# Patient Record
Sex: Male | Born: 1945 | Race: Black or African American | Hispanic: No | Marital: Single | Smoking: Former smoker
Health system: Southern US, Community
[De-identification: ages and names within clinical notes are randomized; demographics above are authoritative.]

## PROBLEM LIST (undated history)

## (undated) DIAGNOSIS — R51 Headache: Secondary | ICD-10-CM

## (undated) DIAGNOSIS — S069XAA Unspecified intracranial injury with loss of consciousness status unknown, initial encounter: Secondary | ICD-10-CM

## (undated) DIAGNOSIS — E119 Type 2 diabetes mellitus without complications: Secondary | ICD-10-CM

## (undated) DIAGNOSIS — R519 Headache, unspecified: Secondary | ICD-10-CM

## (undated) DIAGNOSIS — I2510 Atherosclerotic heart disease of native coronary artery without angina pectoris: Secondary | ICD-10-CM

## (undated) DIAGNOSIS — N186 End stage renal disease: Secondary | ICD-10-CM

## (undated) DIAGNOSIS — Z992 Dependence on renal dialysis: Secondary | ICD-10-CM

## (undated) DIAGNOSIS — S069X9A Unspecified intracranial injury with loss of consciousness of unspecified duration, initial encounter: Secondary | ICD-10-CM

## (undated) DIAGNOSIS — I509 Heart failure, unspecified: Secondary | ICD-10-CM

## (undated) DIAGNOSIS — I469 Cardiac arrest, cause unspecified: Secondary | ICD-10-CM

## (undated) DIAGNOSIS — I10 Essential (primary) hypertension: Secondary | ICD-10-CM

## (undated) DIAGNOSIS — D649 Anemia, unspecified: Secondary | ICD-10-CM

---

## 2017-11-14 DIAGNOSIS — I469 Cardiac arrest, cause unspecified: Secondary | ICD-10-CM

## 2017-11-14 HISTORY — DX: Cardiac arrest, cause unspecified: I46.9

## 2018-02-27 ENCOUNTER — Inpatient Hospital Stay (HOSPITAL_COMMUNITY)
Admission: EM | Admit: 2018-02-27 | Discharge: 2018-03-29 | DRG: 034 | Disposition: A | Discharge status: Home Health | Payer: Medicare HMO | Source: Ambulatory Visit | Attending: Internal Medicine | Admitting: Internal Medicine

## 2018-02-27 ENCOUNTER — Emergency Department (HOSPITAL_COMMUNITY): Payer: Medicare HMO

## 2018-02-27 ENCOUNTER — Observation Stay (HOSPITAL_COMMUNITY): Payer: Medicare HMO

## 2018-02-27 ENCOUNTER — Other Ambulatory Visit: Payer: Self-pay

## 2018-02-27 DIAGNOSIS — R945 Abnormal results of liver function studies: Secondary | ICD-10-CM

## 2018-02-27 DIAGNOSIS — I6522 Occlusion and stenosis of left carotid artery: Secondary | ICD-10-CM | POA: Diagnosis present

## 2018-02-27 DIAGNOSIS — R778 Other specified abnormalities of plasma proteins: Secondary | ICD-10-CM | POA: Diagnosis present

## 2018-02-27 DIAGNOSIS — J9601 Acute respiratory failure with hypoxia: Secondary | ICD-10-CM | POA: Diagnosis not present

## 2018-02-27 DIAGNOSIS — I214 Non-ST elevation (NSTEMI) myocardial infarction: Secondary | ICD-10-CM

## 2018-02-27 DIAGNOSIS — N2581 Secondary hyperparathyroidism of renal origin: Secondary | ICD-10-CM | POA: Diagnosis present

## 2018-02-27 DIAGNOSIS — E1122 Type 2 diabetes mellitus with diabetic chronic kidney disease: Secondary | ICD-10-CM | POA: Diagnosis present

## 2018-02-27 DIAGNOSIS — Z7982 Long term (current) use of aspirin: Secondary | ICD-10-CM

## 2018-02-27 DIAGNOSIS — I771 Stricture of artery: Secondary | ICD-10-CM

## 2018-02-27 DIAGNOSIS — R7989 Other specified abnormal findings of blood chemistry: Secondary | ICD-10-CM | POA: Diagnosis present

## 2018-02-27 DIAGNOSIS — D631 Anemia in chronic kidney disease: Secondary | ICD-10-CM | POA: Diagnosis present

## 2018-02-27 DIAGNOSIS — Z79899 Other long term (current) drug therapy: Secondary | ICD-10-CM

## 2018-02-27 DIAGNOSIS — I132 Hypertensive heart and chronic kidney disease with heart failure and with stage 5 chronic kidney disease, or end stage renal disease: Secondary | ICD-10-CM | POA: Diagnosis present

## 2018-02-27 DIAGNOSIS — I63422 Cerebral infarction due to embolism of left anterior cerebral artery: Principal | ICD-10-CM | POA: Diagnosis present

## 2018-02-27 DIAGNOSIS — I10 Essential (primary) hypertension: Secondary | ICD-10-CM | POA: Diagnosis present

## 2018-02-27 DIAGNOSIS — I6523 Occlusion and stenosis of bilateral carotid arteries: Secondary | ICD-10-CM | POA: Diagnosis present

## 2018-02-27 DIAGNOSIS — T829XXA Unspecified complication of cardiac and vascular prosthetic device, implant and graft, initial encounter: Secondary | ICD-10-CM | POA: Diagnosis present

## 2018-02-27 DIAGNOSIS — I255 Ischemic cardiomyopathy: Secondary | ICD-10-CM | POA: Diagnosis present

## 2018-02-27 DIAGNOSIS — Z992 Dependence on renal dialysis: Secondary | ICD-10-CM

## 2018-02-27 DIAGNOSIS — G459 Transient cerebral ischemic attack, unspecified: Secondary | ICD-10-CM | POA: Diagnosis not present

## 2018-02-27 DIAGNOSIS — Z888 Allergy status to other drugs, medicaments and biological substances status: Secondary | ICD-10-CM

## 2018-02-27 DIAGNOSIS — G8194 Hemiplegia, unspecified affecting left nondominant side: Secondary | ICD-10-CM | POA: Diagnosis present

## 2018-02-27 DIAGNOSIS — I6529 Occlusion and stenosis of unspecified carotid artery: Secondary | ICD-10-CM

## 2018-02-27 DIAGNOSIS — R0981 Nasal congestion: Secondary | ICD-10-CM | POA: Diagnosis present

## 2018-02-27 DIAGNOSIS — K922 Gastrointestinal hemorrhage, unspecified: Secondary | ICD-10-CM | POA: Diagnosis not present

## 2018-02-27 DIAGNOSIS — E785 Hyperlipidemia, unspecified: Secondary | ICD-10-CM | POA: Diagnosis present

## 2018-02-27 DIAGNOSIS — S069XAA Unspecified intracranial injury with loss of consciousness status unknown, initial encounter: Secondary | ICD-10-CM | POA: Diagnosis present

## 2018-02-27 DIAGNOSIS — I5022 Chronic systolic (congestive) heart failure: Secondary | ICD-10-CM

## 2018-02-27 DIAGNOSIS — E119 Type 2 diabetes mellitus without complications: Secondary | ICD-10-CM

## 2018-02-27 DIAGNOSIS — Z5309 Procedure and treatment not carried out because of other contraindication: Secondary | ICD-10-CM | POA: Diagnosis not present

## 2018-02-27 DIAGNOSIS — I509 Heart failure, unspecified: Secondary | ICD-10-CM

## 2018-02-27 DIAGNOSIS — T82855A Stenosis of coronary artery stent, initial encounter: Secondary | ICD-10-CM | POA: Diagnosis present

## 2018-02-27 DIAGNOSIS — R29702 NIHSS score 2: Secondary | ICD-10-CM | POA: Diagnosis present

## 2018-02-27 DIAGNOSIS — I6501 Occlusion and stenosis of right vertebral artery: Secondary | ICD-10-CM | POA: Diagnosis present

## 2018-02-27 DIAGNOSIS — I639 Cerebral infarction, unspecified: Secondary | ICD-10-CM

## 2018-02-27 DIAGNOSIS — R627 Adult failure to thrive: Secondary | ICD-10-CM

## 2018-02-27 DIAGNOSIS — Z8249 Family history of ischemic heart disease and other diseases of the circulatory system: Secondary | ICD-10-CM

## 2018-02-27 DIAGNOSIS — R0602 Shortness of breath: Secondary | ICD-10-CM

## 2018-02-27 DIAGNOSIS — M898X9 Other specified disorders of bone, unspecified site: Secondary | ICD-10-CM | POA: Diagnosis present

## 2018-02-27 DIAGNOSIS — I252 Old myocardial infarction: Secondary | ICD-10-CM

## 2018-02-27 DIAGNOSIS — N186 End stage renal disease: Secondary | ICD-10-CM

## 2018-02-27 DIAGNOSIS — D696 Thrombocytopenia, unspecified: Secondary | ICD-10-CM | POA: Diagnosis not present

## 2018-02-27 DIAGNOSIS — D638 Anemia in other chronic diseases classified elsewhere: Secondary | ICD-10-CM

## 2018-02-27 DIAGNOSIS — I2510 Atherosclerotic heart disease of native coronary artery without angina pectoris: Secondary | ICD-10-CM | POA: Diagnosis present

## 2018-02-27 DIAGNOSIS — Z955 Presence of coronary angioplasty implant and graft: Secondary | ICD-10-CM

## 2018-02-27 DIAGNOSIS — E1151 Type 2 diabetes mellitus with diabetic peripheral angiopathy without gangrene: Secondary | ICD-10-CM | POA: Diagnosis present

## 2018-02-27 DIAGNOSIS — S069X9A Unspecified intracranial injury with loss of consciousness of unspecified duration, initial encounter: Secondary | ICD-10-CM | POA: Diagnosis present

## 2018-02-27 DIAGNOSIS — N2889 Other specified disorders of kidney and ureter: Secondary | ICD-10-CM | POA: Diagnosis present

## 2018-02-27 DIAGNOSIS — D62 Acute posthemorrhagic anemia: Secondary | ICD-10-CM | POA: Diagnosis present

## 2018-02-27 DIAGNOSIS — Z87891 Personal history of nicotine dependence: Secondary | ICD-10-CM

## 2018-02-27 HISTORY — DX: End stage renal disease: N18.6

## 2018-02-27 HISTORY — DX: Dependence on renal dialysis: Z99.2

## 2018-02-27 HISTORY — DX: Heart failure, unspecified: I50.9

## 2018-02-27 HISTORY — DX: Headache, unspecified: R51.9

## 2018-02-27 HISTORY — DX: Unspecified intracranial injury with loss of consciousness status unknown, initial encounter: S06.9XAA

## 2018-02-27 HISTORY — DX: Type 2 diabetes mellitus without complications: E11.9

## 2018-02-27 HISTORY — DX: Unspecified intracranial injury with loss of consciousness of unspecified duration, initial encounter: S06.9X9A

## 2018-02-27 HISTORY — DX: Essential (primary) hypertension: I10

## 2018-02-27 HISTORY — DX: Atherosclerotic heart disease of native coronary artery without angina pectoris: I25.10

## 2018-02-27 HISTORY — DX: Headache: R51

## 2018-02-27 HISTORY — DX: Cardiac arrest, cause unspecified: I46.9

## 2018-02-27 HISTORY — DX: Anemia, unspecified: D64.9

## 2018-02-27 LAB — COMPREHENSIVE METABOLIC PANEL
ALT: 42 U/L (ref 0–44)
AST: 28 U/L (ref 15–41)
Albumin: 3.5 g/dL (ref 3.5–5.0)
Alkaline Phosphatase: 141 U/L — ABNORMAL HIGH (ref 38–126)
Anion gap: 14 (ref 5–15)
BUN: 31 mg/dL — ABNORMAL HIGH (ref 8–23)
CHLORIDE: 101 mmol/L (ref 98–111)
CO2: 24 mmol/L (ref 22–32)
Calcium: 9.3 mg/dL (ref 8.9–10.3)
Creatinine, Ser: 8.45 mg/dL — ABNORMAL HIGH (ref 0.61–1.24)
GFR calc Af Amer: 7 mL/min — ABNORMAL LOW (ref >60)
GFR calc non Af Amer: 6 mL/min — ABNORMAL LOW (ref >60)
Glucose, Bld: 134 mg/dL — ABNORMAL HIGH (ref 70–99)
Potassium: 4.1 mmol/L (ref 3.5–5.1)
Sodium: 139 mmol/L (ref 135–145)
Total Bilirubin: 1.5 mg/dL — ABNORMAL HIGH (ref 0.3–1.2)
Total Protein: 8.9 g/dL — ABNORMAL HIGH (ref 6.5–8.1)

## 2018-02-27 LAB — DIFFERENTIAL
Abs Immature Granulocytes: 0.04 10*3/uL (ref 0.00–0.07)
Basophils Absolute: 0 10*3/uL (ref 0.0–0.1)
Basophils Relative: 1 %
Eosinophils Absolute: 0.7 10*3/uL — ABNORMAL HIGH (ref 0.0–0.5)
Eosinophils Relative: 9 %
IMMATURE GRANULOCYTES: 1 %
Lymphocytes Relative: 24 %
Lymphs Abs: 1.7 10*3/uL (ref 0.7–4.0)
Monocytes Absolute: 0.5 10*3/uL (ref 0.1–1.0)
Monocytes Relative: 7 %
Neutro Abs: 4.2 10*3/uL (ref 1.7–7.7)
Neutrophils Relative %: 58 %

## 2018-02-27 LAB — CBC
HCT: 41.9 % (ref 39.0–52.0)
Hemoglobin: 13.2 g/dL (ref 13.0–17.0)
MCH: 28.7 pg (ref 26.0–34.0)
MCHC: 31.5 g/dL (ref 30.0–36.0)
MCV: 91.1 fL (ref 80.0–100.0)
NRBC: 0 % (ref 0.0–0.2)
PLATELETS: 194 10*3/uL (ref 150–400)
RBC: 4.6 MIL/uL (ref 4.22–5.81)
RDW: 15.6 % — ABNORMAL HIGH (ref 11.5–15.5)
WBC: 7.1 10*3/uL (ref 4.0–10.5)

## 2018-02-27 LAB — PROTIME-INR
INR: 1.08
Prothrombin Time: 13.9 seconds (ref 11.4–15.2)

## 2018-02-27 LAB — ETHANOL: Alcohol, Ethyl (B): <10 mg/dL (ref <10)

## 2018-02-27 LAB — I-STAT TROPONIN, ED: Troponin i, poc: 0.09 ng/mL (ref 0.00–0.08)

## 2018-02-27 LAB — APTT: aPTT: 28 seconds (ref 24–36)

## 2018-02-27 LAB — TROPONIN I: Troponin I: 0.22 ng/mL (ref <0.03)

## 2018-02-27 LAB — I-STAT CREATININE, ED: Creatinine, Ser: 8.6 mg/dL — ABNORMAL HIGH (ref 0.61–1.24)

## 2018-02-27 MED ORDER — HEPARIN SODIUM (PORCINE) 5000 UNIT/ML IJ SOLN
5000.0000 [IU] | Freq: Three times a day (TID) | INTRAMUSCULAR | Status: DC
  Administered 2018-02-27 – 2018-03-03 (×12): 5000 [IU] via SUBCUTANEOUS
  Filled 2018-02-27 (×12): qty 1

## 2018-02-27 MED ORDER — POLYVINYL ALCOHOL 1.4 % OP SOLN
1.0000 [drp] | Freq: Every day | OPHTHALMIC | Status: DC | PRN
  Filled 2018-02-27: qty 15

## 2018-02-27 MED ORDER — STROKE: EARLY STAGES OF RECOVERY BOOK
Freq: Once | Status: AC
  Administered 2018-02-27: 23:00:00
  Filled 2018-02-27: qty 1

## 2018-02-27 MED ORDER — SENNOSIDES-DOCUSATE SODIUM 8.6-50 MG PO TABS: 1.0000 | ORAL_TABLET | Freq: Every evening | ORAL | Status: DC | PRN

## 2018-02-27 MED ORDER — ASPIRIN EC 325 MG PO TBEC
325.0000 mg | DELAYED_RELEASE_TABLET | Freq: Every day | ORAL | Status: DC
  Administered 2018-02-28 – 2018-03-05 (×5): 325 mg via ORAL
  Filled 2018-02-27 (×7): qty 1

## 2018-02-27 MED ORDER — ACETAMINOPHEN 325 MG PO TABS
650.0000 mg | ORAL_TABLET | Freq: Four times a day (QID) | ORAL | Status: DC | PRN
  Administered 2018-02-27 – 2018-03-09 (×8): 650 mg via ORAL
  Filled 2018-02-27 (×9): qty 2

## 2018-02-27 MED ORDER — RENA-VITE PO TABS
1.0000 | ORAL_TABLET | Freq: Every day | ORAL | Status: DC
  Administered 2018-02-27 – 2018-03-29 (×29): 1 via ORAL
  Filled 2018-02-27 (×30): qty 1

## 2018-02-27 MED ORDER — AMLODIPINE BESYLATE 5 MG PO TABS
5.0000 mg | ORAL_TABLET | Freq: Every day | ORAL | Status: DC
  Administered 2018-02-27 – 2018-03-17 (×19): 5 mg via ORAL
  Filled 2018-02-27 (×4): qty 1
  Filled 2018-02-27: qty 2
  Filled 2018-02-27 (×14): qty 1

## 2018-02-27 MED ORDER — ATORVASTATIN CALCIUM 40 MG PO TABS
40.0000 mg | ORAL_TABLET | Freq: Every day | ORAL | Status: DC
  Administered 2018-02-27 – 2018-03-28 (×28): 40 mg via ORAL
  Filled 2018-02-27 (×12): qty 1
  Filled 2018-02-27: qty 4
  Filled 2018-02-27 (×16): qty 1

## 2018-02-27 MED ORDER — IOHEXOL 300 MG/ML  SOLN
100.0000 mL | Freq: Once | INTRAMUSCULAR | Status: AC | PRN
  Administered 2018-02-27: 100 mL via INTRAVENOUS

## 2018-02-27 MED ORDER — FAMOTIDINE 20 MG PO TABS
20.0000 mg | ORAL_TABLET | Freq: Every day | ORAL | Status: DC
  Administered 2018-02-27 – 2018-03-28 (×30): 20 mg via ORAL
  Filled 2018-02-27 (×30): qty 1

## 2018-02-27 NOTE — ED Triage Notes (Signed)
Pt was at dialysis today when he was finished he could not get out of the chair. Pt felt weak on left side. Pt states he always feels tired after dialysis but he always drives himself there and home. Pt NIH 2, drift on left arm, slight ataxia with left arm. Pt. Alert and oriented x 4

## 2018-02-27 NOTE — H&P (Addendum)
History and Physical    Peter [name] KNL:[id number] DOB: May 30, 1945 DOA: 02/27/2018  PCP: Patient, No Pcp Per  Chief Complaint: Weakness  HPI: Peter [name] is a 73 y.o. male with a past medical history of ESRD on HD, hypertension being brought to the hospital from his dialysis center due to strokelike symptoms with left sided weakness. Last known normal 3:15 this afternoon.  Patient states after finishing dialysis today he felt weak and could not get out of the chair.  Both of his legs felt weak and his right leg was shaking.  He could not lift his arms.  States he was told that his left arm was not moving.  Denies any history of prior stroke.  He is a former smoker; quit 4 years ago.  He was previously smoking 1/2 pack/day for about 20 years.  He is complaining of a headache.  Denies having any chest pain.  No additional history could be obtained from the patient.  Review of Systems: As per HPI otherwise 10 point review of systems negative.  Past Medical History:  Diagnosis Date  . ESRD (end stage renal disease) on dialysis ([place])   . Headache   . HTN (hypertension)     History reviewed. No pertinent surgical history.   reports that he has quit smoking. He does not have any smokeless tobacco history on file. He reports that he does not drink alcohol or use drugs.  Allergies  Allergen Reactions  . Metformin And Related Other (See Comments)    Contraindicated due to renal failure.  Renal failure-on peritoneal dialysis    . Pioglitazone Other (See Comments)    History of congestive heart failure.    . Propoxyphene Rash    No family history on file.  Prior to Admission medications   Medication Sig Start Date End Date Taking? Authorizing Provider  acetaminophen (TYLENOL) 500 MG tablet Take 1,500 mg by mouth daily as needed (pain).    Yes [provider]  amLODipine (NORVASC) 5 MG tablet Take 5 mg by mouth at bedtime. 02/06/18  Yes [provider]  aspirin EC 325  MG tablet Take 325 mg by mouth daily.   Yes [provider]  atorvastatin (LIPITOR) 40 MG tablet Take 40 mg by mouth daily at 6 PM. 12/24/17  Yes [provider]  Calcium Carbonate Antacid (TUMS PO) Take 1-3 mg by mouth See admin instructions. Take 3 tablets by mouth up to three times daily with full meals, take 1 tablet with snacks/small meals   Yes [provider]  famotidine (PEPCID) 20 MG tablet Take 20 mg by mouth at bedtime. 12/22/17  Yes [provider]  furosemide (LASIX) 40 MG tablet Take 40 mg by mouth 2 (two) times daily. 02/17/18  Yes [provider]  isosorbide mononitrate (IMDUR) 60 MG 24 hr tablet Take 60 mg by mouth daily. 12/25/17  Yes [provider]  losartan (COZAAR) 100 MG tablet Take 100 mg by mouth daily. 02/25/18  Yes [provider]  metoprolol (TOPROL-XL) 200 MG 24 hr tablet Take 200 mg by mouth daily. 10/24/17  Yes [provider]  multivitamin (RENA-VIT) TABS tablet Take 1 tablet by mouth daily.   Yes [provider]  nitroGLYCERIN (NITROSTAT) 0.4 MG SL tablet Place 0.4 mg under the tongue every 5 (five) minutes as needed for chest pain.  12/25/17  Yes [provider]  polyvinyl alcohol (ARTIFICIAL TEARS) 1.4 % ophthalmic solution Place 1 drop into both eyes daily as needed  for dry eyes.   Yes [provider]    Physical Exam: Vitals:   02/27/18 2150 02/28/18 0040 02/28/18 0230 02/28/18 0402  BP: (!) 190/72 (!) 172/54 (!) 165/47 (!) 180/52  Pulse: 70 70 70 72  Resp: 16 18 18 18   Temp: 98.9 F (37.2 C) 98.4 F (36.9 C) 98.2 F (36.8 C) 98.2 F (36.8 C)  TempSrc: Oral Oral Oral Oral  SpO2: 98% 98% 98% 100%  Weight:        Physical Exam  Constitutional: He is oriented to person, place, and time. He appears well-developed and well-nourished. No distress.  HENT:  Head: Normocephalic.  Mouth/Throat: Oropharynx is clear and moist.  Eyes: Right eye exhibits no  discharge. Left eye exhibits no discharge.  Neck: Neck supple.  Cardiovascular: Normal rate, regular rhythm and intact distal pulses.  Pulmonary/Chest: Effort normal and breath sounds normal. No respiratory distress. He has no wheezes. He has no rales.  Abdominal: Soft. Bowel sounds are normal. He exhibits no distension. There is no abdominal tenderness. There is no guarding.  Musculoskeletal:        General: No edema.  Neurological: He is alert and oriented to person, place, and time.  Slow to respond to questions Speech fluent, tongue midline, no facial droop. Strength 4 out of 5 in the left upper and lower extremities. Strength 5 out of 5 in the right upper and lower extremities. Sensation to light touch grossly intact throughout.  Skin: Skin is warm and dry. He is not diaphoretic.     Labs on Admission: I have personally reviewed following labs and imaging studies  CBC: Recent Labs  Lab 02/27/18 1658  WBC 7.1  NEUTROABS 4.2  HGB 13.2  HCT 41.9  MCV 91.1  PLT 373   Basic Metabolic Panel: Recent Labs  Lab 02/27/18 1658 02/27/18 1703  NA 139  --   K 4.1  --   CL 101  --   CO2 24  --   GLUCOSE 134*  --   BUN 31*  --   CREATININE 8.45* 8.60*  CALCIUM 9.3  --    GFR: CrCl cannot be calculated (Unknown ideal weight.). Liver Function Tests: Recent Labs  Lab 02/27/18 1658  AST 28  ALT 42  ALKPHOS 141*  BILITOT 1.5*  PROT 8.9*  ALBUMIN 3.5   No results for input(s): LIPASE, AMYLASE in the last 168 hours. No results for input(s): AMMONIA in the last 168 hours. Coagulation Profile: Recent Labs  Lab 02/27/18 1658  INR 1.08   Cardiac Enzymes: Recent Labs  Lab 02/27/18 2251 02/28/18 0207  TROPONINI 0.22* 0.26*   BNP (last 3 results) No results for input(s): PROBNP in the last 8760 hours. HbA1C: Recent Labs    02/28/18 0207  HGBA1C 6.6*   CBG: No results for input(s): GLUCAP in the last 168 hours. Lipid Profile: Recent Labs    02/28/18 0207    CHOL 106  HDL 36*  LDLCALC 48  TRIG 108  CHOLHDL 2.9   Thyroid Function Tests: No results for input(s): TSH, T4TOTAL, FREET4, T3FREE, THYROIDAB in the last 72 hours. Anemia Panel: No results for input(s): VITAMINB12, FOLATE, FERRITIN, TIBC, IRON, RETICCTPCT in the last 72 hours. Urine analysis: No results found for: COLORURINE, APPEARANCEUR, [place], [place], GLUCOSEU, HGBUR, BILIRUBINUR, KETONESUR, PROTEINUR, UROBILINOGEN, NITRITE, LEUKOCYTESUR  Radiological Exams on Admission: Ct Angio Head W Or Wo Contrast  Result Date: 02/27/2018 CLINICAL DATA:  Sudden onset left-sided weakness and altered mental status following dialysis. EXAM:  CT ANGIOGRAPHY HEAD AND NECK TECHNIQUE: Multidetector CT imaging of the head and neck was performed using the standard protocol during bolus administration of intravenous contrast. Multiplanar CT image reconstructions and MIPs were obtained to evaluate the vascular anatomy. Carotid stenosis measurements (when applicable) are obtained utilizing NASCET criteria, using the distal internal carotid diameter as the denominator. CONTRAST:  115mL OMNIPAQUE IOHEXOL 300 MG/ML  SOLN COMPARISON:  Head CT 02/27/2018 FINDINGS: CTA NECK FINDINGS SKELETON: There is no bony spinal canal stenosis. No lytic or blastic lesion. OTHER NECK: Normal pharynx, larynx and major salivary glands. No cervical lymphadenopathy. Unremarkable thyroid gland. UPPER CHEST: Mild biapical emphysema AORTIC ARCH: There is moderate calcific atherosclerosis of the aortic arch. There is no aneurysm, dissection or hemodynamically significant stenosis of the visualized ascending aorta and aortic arch. Normal variant aortic arch branching pattern with the left vertebral artery arising independently from the aortic arch. The visualized proximal subclavian arteries are widely patent. RIGHT CAROTID SYSTEM: --Common carotid artery: Widely patent origin without common carotid artery dissection or aneurysm. --Internal  carotid artery: No dissection, occlusion or aneurysm. Mild atherosclerotic calcification at the carotid bifurcation without hemodynamically significant stenosis. --External carotid artery: No acute abnormality. LEFT CAROTID SYSTEM: --Common carotid artery: Widely patent origin without common carotid artery dissection or aneurysm. --Internal carotid artery: No dissection, occlusion or aneurysm. There is predominantly noncalcified atherosclerosis extending into the proximal ICA, resulting in 50% stenosis. Small area of likely ulcerative plaque within the distal left ICA. --External carotid artery: No acute abnormality. VERTEBRAL ARTERIES: Codominant configuration. At least moderate stenosis of the right vertebral artery origin. The remainder of the right vertebral artery is normal. Normal left vertebral artery. CTA HEAD FINDINGS POSTERIOR CIRCULATION: --Basilar artery: Normal. --Posterior cerebral arteries: Both posterior cerebral arteries are partially supplied by the posterior communicating arteries. --Superior cerebellar arteries: Normal. --Inferior cerebellar arteries: Normal anterior and posterior inferior cerebellar arteries. ANTERIOR CIRCULATION: --Intracranial internal carotid arteries: Atherosclerotic calcification of the internal carotid arteries at the skull base without hemodynamically significant stenosis. --Anterior cerebral arteries: Normal. Both A1 segments are present. Patent anterior communicating artery. --Middle cerebral arteries: Normal. --Posterior communicating arteries: Present bilaterally. VENOUS SINUSES: As permitted by contrast timing, patent. ANATOMIC VARIANTS: None DELAYED PHASE: No parenchymal contrast enhancement. Review of the MIP images confirms the above findings. IMPRESSION: 1. No emergent large vessel occlusion or high-grade intracranial stenosis. 2. Approximately 50% stenosis of the proximal left internal carotid artery. 3. At least moderate stenosis of the right vertebral artery  origin. Aortic Atherosclerosis (ICD10-I70.0) and Emphysema (ICD10-J43.9). Electronically Signed   By: Ulyses Jarred M.D.   On: 02/27/2018 18:04   Ct Angio Neck W Or Wo Contrast  Result Date: 02/27/2018 CLINICAL DATA:  Sudden onset left-sided weakness and altered mental status following dialysis. EXAM: CT ANGIOGRAPHY HEAD AND NECK TECHNIQUE: Multidetector CT imaging of the head and neck was performed using the standard protocol during bolus administration of intravenous contrast. Multiplanar CT image reconstructions and MIPs were obtained to evaluate the vascular anatomy. Carotid stenosis measurements (when applicable) are obtained utilizing NASCET criteria, using the distal internal carotid diameter as the denominator. CONTRAST:  130mL OMNIPAQUE IOHEXOL 300 MG/ML  SOLN COMPARISON:  Head CT 02/27/2018 FINDINGS: CTA NECK FINDINGS SKELETON: There is no bony spinal canal stenosis. No lytic or blastic lesion. OTHER NECK: Normal pharynx, larynx and major salivary glands. No cervical lymphadenopathy. Unremarkable thyroid gland. UPPER CHEST: Mild biapical emphysema AORTIC ARCH: There is moderate calcific atherosclerosis of the aortic arch. There is no aneurysm, dissection or hemodynamically  significant stenosis of the visualized ascending aorta and aortic arch. Normal variant aortic arch branching pattern with the left vertebral artery arising independently from the aortic arch. The visualized proximal subclavian arteries are widely patent. RIGHT CAROTID SYSTEM: --Common carotid artery: Widely patent origin without common carotid artery dissection or aneurysm. --Internal carotid artery: No dissection, occlusion or aneurysm. Mild atherosclerotic calcification at the carotid bifurcation without hemodynamically significant stenosis. --External carotid artery: No acute abnormality. LEFT CAROTID SYSTEM: --Common carotid artery: Widely patent origin without common carotid artery dissection or aneurysm. --Internal carotid  artery: No dissection, occlusion or aneurysm. There is predominantly noncalcified atherosclerosis extending into the proximal ICA, resulting in 50% stenosis. Small area of likely ulcerative plaque within the distal left ICA. --External carotid artery: No acute abnormality. VERTEBRAL ARTERIES: Codominant configuration. At least moderate stenosis of the right vertebral artery origin. The remainder of the right vertebral artery is normal. Normal left vertebral artery. CTA HEAD FINDINGS POSTERIOR CIRCULATION: --Basilar artery: Normal. --Posterior cerebral arteries: Both posterior cerebral arteries are partially supplied by the posterior communicating arteries. --Superior cerebellar arteries: Normal. --Inferior cerebellar arteries: Normal anterior and posterior inferior cerebellar arteries. ANTERIOR CIRCULATION: --Intracranial internal carotid arteries: Atherosclerotic calcification of the internal carotid arteries at the skull base without hemodynamically significant stenosis. --Anterior cerebral arteries: Normal. Both A1 segments are present. Patent anterior communicating artery. --Middle cerebral arteries: Normal. --Posterior communicating arteries: Present bilaterally. VENOUS SINUSES: As permitted by contrast timing, patent. ANATOMIC VARIANTS: None DELAYED PHASE: No parenchymal contrast enhancement. Review of the MIP images confirms the above findings. IMPRESSION: 1. No emergent large vessel occlusion or high-grade intracranial stenosis. 2. Approximately 50% stenosis of the proximal left internal carotid artery. 3. At least moderate stenosis of the right vertebral artery origin. Aortic Atherosclerosis (ICD10-I70.0) and Emphysema (ICD10-J43.9). Electronically Signed   By: Ulyses Jarred M.D.   On: 02/27/2018 18:04   [name]  Result Date: 02/28/2018 CLINICAL DATA:  Initial evaluation for acute left-sided weakness. EXAM: MRI HEAD WITHOUT CONTRAST TECHNIQUE: Multiplanar, multiecho pulse sequences of the  brain and surrounding structures were obtained without intravenous contrast. COMPARISON:  Prior CT and CTA from earlier same day. FINDINGS: Brain: Generalized age-related cerebral atrophy. Mild T2/FLAIR hyperintensity within the periventricular and deep white matter both cerebral hemispheres, nonspecific, but most like related chronic microvascular ischemic disease, minimal for age. Few scattered small remote bilateral cerebellar infarcts noted. Subtle scattered diffusion abnormality seen involving the parasagittal cortical gray matter of the high right frontoparietal region (series 5, images 87, 84, 83). Additional subtle diffusion abnormality seen involving the parasagittal left frontal parietal region, left cingulate (series 5, image 81). Small focus of diffusion abnormality at the anterior left frontal lobe (series 5, image 80). Finding compatible with small acute ischemic infarcts, ACA and/or ACA/MCA distributions. No associated hemorrhage or mass effect. Gray-white matter differentiation otherwise maintained. No other evidence for remote cortical infarction. No foci of susceptibility artifact to suggest acute or chronic intracranial hemorrhage. No mass lesion, midline shift or mass effect. No hydrocephalus. No extra-axial fluid collection. Pituitary gland within normal limits. Vascular: Major intracranial vascular flow voids maintained at the skull base. Diminutive vertebrobasilar system noted. Skull and upper cervical spine: Craniocervical junction normal. Upper cervical spine within normal limits. Bone marrow signal intensity normal. No scalp soft tissue abnormality. Sinuses/Orbits: Globes and orbital soft tissues within normal limits. Mild scattered mucosal thickening within the ethmoidal air cells. Paranasal sinuses are otherwise largely clear. Trace right mastoid effusion, of doubtful significance. Inner ear structures grossly normal. Other:  None. IMPRESSION: 1. Scattered small volume diffusion  abnormality involving the parasagittal cerebral hemispheres bilaterally near the vertex, right greater than left, with additional mild left frontal cortical involvement as above, compatible with acute ischemia. Changes are largely ACA distribution, with some MCA and/or ACA-MCA watershed distribution. 2. Few small remote bilateral cerebellar infarcts. 3. Underlying age-related cerebral atrophy with mild chronic microvascular ischemic disease. Electronically Signed   By: Jeannine Boga M.D.   On: 02/28/2018 01:02   Ct Head Code Stroke Wo Contrast  Result Date: 02/27/2018 CLINICAL DATA:  Code stroke.  73 y/o  M; left-sided weakness. EXAM: CT HEAD WITHOUT CONTRAST TECHNIQUE: Contiguous axial images were obtained from the base of the skull through the vertex without intravenous contrast. COMPARISON:  None. FINDINGS: Brain: No evidence of acute infarction, hemorrhage, hydrocephalus, extra-axial collection or mass lesion/mass effect. Nonspecific white matter hypodensities compatible with chronic microvascular ischemic changes volume loss of the brain. Vascular: Density within right M2 superior division may represent thrombus. Skull: Normal. Negative for fracture or focal lesion. Sinuses/Orbits: No acute finding. Other: None. ASPECTS [facility] Stroke Program Early CT Score) - Ganglionic level infarction (caudate, lentiform nuclei, internal capsule, insula, M1-M3 cortex): 7 - Supraganglionic infarction (M4-M6 cortex): 3 Total score (0-10 with 10 being normal): 10 IMPRESSION: 1. No acute intracranial abnormality identified. 2. Density within right M2 superior division may represent thrombus. 3. Mild chronic microvascular ischemic changes and volume loss of brain. 4. ASPECTS is 10 These results were called by telephone at the time of interpretation on 02/27/2018 at 4:48 pm to Dr. Lorraine Lax, who verbally acknowledged these results. Electronically Signed   By: Kristine Garbe M.D.   On: 02/27/2018 16:49    EKG:  Independently reviewed.  Sinus rhythm, T wave inversions in inferior and lateral leads.  No prior EKG for comparison.  Assessment/Plan Principal Problem:   Acute CVA (cerebrovascular accident) ([place]) Active Problems:   Elevated troponin   Elevated LFTs   HTN (hypertension)   Acute CVA -Patient presented with complains of left-sided weakness.  Code stroke was called.  Patient was seen by neurology and decision was made not to give TPA.  Weakness currently improved. -CT head showing no acute intracranial abnormality; density within the right M2 superior division may represent thrombus. -CT angiogram head and neck showing no emergent large vessel occlusion or high-grade intracranial stenosis.  Approximately 50% stenosis of the proximal left internal carotid artery and moderate stenosis of the right vertebral artery origin. -MRI showing scattered small volume diffusion abnormality involving the parasagittal cerebral hemispheres bilaterally near the vertex, right greater than left, with additional mild left frontal cortical involvement as above, compatible with acute ischemia. -BP goal : Permissive HTN upto 220/110 mmHg   -Echocardiogram -ASA -High intensity Statin if LDL > 70 -HgbA1c, fasting lipid panel -PT consult, OT consult, Speech consult -Telemetry monitoring -Frequent neuro checks -Stroke swallow screen -Neurology following; appreciate recs  Elevated troponin and abnormal EKG -Possibly due to demand ischemia from dialysis. -Troponin 0.09 >0.22 >0.26.  EKG with T wave inversions in inferior and lateral leads.  No prior EKG for comparison.  Patient continues to be chest pain-free and appears comfortable on exam. -Continue to trend troponin -Echocardiogram -Cardiac monitoring -ED provider discussed the case with cardiology.  No recommendations.  Elevated LFTs Alkaline phosphatase 141, T bili 1.5.  AST and ALT normal.  Abdominal exam benign.  No recent labs for comparison. -Right  upper quadrant ultrasound  Hypertension -Allow permissive hypertension  DVT prophylaxis: Subcutaneous heparin Code Status:  Full code.  Discussed with the patient and daughter at bedside. Family Communication: Daughter at bedside. Disposition Plan: Anticipate discharge in 1 to 2 days. Admission status: Observation, telemetry   Shela Leff MD [facility] Pager [phone]  If 7PM-7AM, please contact night-coverage [email or web address] Password [facility]  02/28/2018, 4:41 AM

## 2018-02-27 NOTE — Consult Note (Addendum)
NEURO HOSPITALIST  CONSULT   Requesting Physician: Dr. Venora Maples     Chief Complaint:  Left sided weakness  History obtained from:  Patient     HPI:                                                                                                                                         Peter [name] is an 73 y.o. male  With PMH of ESRD ( on dialysis) who presented to [facility] ED after dialysis as a code stroke for left side weakness.   Per EMS patient was finished with dialysis ( they removed 5 pounds), he had a sudden onset of left sided weakness and confusion and an episode of staring off. Patient did receive a heparin bolus. Patient usually drives himself to and from dialysis. Denies any CP, SOB, weakness.  ED course:  CTH: no hemorrhage; density within right M2 BG: 112 BP: 190 PTT: 28 INR:1.08 PT: 13.9 CTA: no LVO   Date last known well: 02-27-2018 Time last known well: 1515 tPA Given: no; too mild to treat Modified Rankin: Rankin Score=0 NIHSS:2 No past medical history on file.  The histories are not reviewed yet. Please review them in the "History" navigator section and refresh this Cade.  No family history on file.      Social History:  has no history on file for tobacco, alcohol, and drug.  Allergies: Allergies not on file  Medications:                                                                                                                           No current facility-administered medications for this encounter.    No current outpatient medications on file.     ROS:  ROS was performed and is negative except as noted in HPI  General Examination:                                                                                                      There were no vitals taken for this  visit.  HEENT-  Normocephalic, no lesions, without obvious abnormality.  Normal external eye and conjunctiva. Cardiovascular- S1-S2 audible, pulses palpable throughout  Lungs-no rhonchi or wheezing noted, no excessive working breathing.  Saturations within normal limits on RA Abdomen- All 4 quadrants palpated and non tender Extremities- Warm, dry and intact Musculoskeletal-no joint tenderness, deformity or swelling Skin-warm and dry, no hyperpigmentation, vitiligo, or suspicious lesions  Neurological Examination Mental Status: Alert, oriented, thought content appropriate.  Speech fluent without evidence of aphasia.  Able to follow  commands without difficulty. Cranial Nerves: II:  Visual fields grossly normal,  III,IV, VI: ptosis not present, extra-ocular motions intact bilaterally, pupils equal, round, reactive to light and accommodation V,VII: smile symmetric, facial light touch sensation normal bilaterally VIII: hearing normal bilaterally IX,X: uvula rises midline XI: bilateral shoulder shrug XII: midline tongue extension Motor: Right : Upper extremity   4/5  Left:     Upper extremity   4/5  Lower extremity   5/5   Lower extremity   5/5 Tone and bulk:normal tone throughout; no atrophy noted Sensory: Pinprick and light touch intact throughout, bilaterally Plantars: Right: downgoing   Left: downgoing Cerebellar: Ataxic FNF bilaterally,  Gait: deferred   Lab Results: Basic Metabolic Panel: Recent Labs  Lab 02/27/18 1658 02/27/18 1703  NA 139  --   K 4.1  --   CL 101  --   CO2 24  --   GLUCOSE 134*  --   BUN 31*  --   CREATININE 8.45* 8.60*  CALCIUM 9.3  --     CBC: Recent Labs  Lab 02/27/18 1658  WBC 7.1  NEUTROABS 4.2  HGB 13.2  HCT 41.9  MCV 91.1  PLT 194     Imaging: Ct Head Code Stroke Wo Contrast  Result Date: 02/27/2018 CLINICAL DATA:  Code stroke.  73 y/o  M; left-sided weakness. EXAM: CT HEAD WITHOUT CONTRAST TECHNIQUE: Contiguous axial images  were obtained from the base of the skull through the vertex without intravenous contrast. COMPARISON:  None. FINDINGS: Brain: No evidence of acute infarction, hemorrhage, hydrocephalus, extra-axial collection or mass lesion/mass effect. Nonspecific white matter hypodensities compatible with chronic microvascular ischemic changes volume loss of the brain. Vascular: Density within right M2 superior division may represent thrombus. Skull: Normal. Negative for fracture or focal lesion. Sinuses/Orbits: No acute finding. Other: None. ASPECTS [facility] Stroke Program Early CT Score) - Ganglionic level infarction (caudate, lentiform nuclei, internal capsule, insula, M1-M3 cortex): 7 - Supraganglionic infarction (M4-M6 cortex): 3 Total score (0-10 with 10 being normal): 10 IMPRESSION: 1. No acute intracranial abnormality identified. 2. Density within right M2 superior division may represent thrombus. 3. Mild chronic microvascular ischemic changes and volume loss of brain. 4. ASPECTS is 10 These results were called by telephone at the time  of interpretation on 02/27/2018 at 4:48 pm to Dr. Lorraine Lax, who verbally acknowledged these results. Electronically Signed   By: Kristine Garbe M.D.   On: 02/27/2018 16:49    Laurey Morale, MSN, NP-C [facility] [phone]  02/27/2018, 4:43 PM   Attending physician note to follow with Assessment and plan .  Assessment: 73 y.o. male With PMH of ESRD (on dialysis) who presented to [facility] ED after dialysis as a code stroke for left side weakness.CTH: no hemorrhage. CTA no LVO.  tPA not administered as deficits were mild.   Recommendations: -- BP goal : Permissive HTN upto 220/110 mmHg  --MRI Brain  --CTA  --Echocardiogram -- ASA -- High intensity Statin if LDL > 70 -- HgbA1c, fasting lipid panel -- PT consult, OT consult, Speech consult --Telemetry monitoring --Frequent neuro checks --Stroke swallow screen     --please page stroke NP  Or  PA  Or MD  from 8am -4 pm  as this patient from this time will be  followed by the stroke.   You can look them up on [email or web address]  Password [email or web address]   NEUROHOSPITALIST ADDENDUM Performed a face to face diagnostic evaluation.   I have reviewed the contents of history and physical exam as documented by PA/ARNP/Resident and agree with above documentation.  I have discussed and formulated the above plan as documented. Edits to the note have been made as needed.  Impression:-73 year-old male with past medical history of end-stage renal disease as a stroke alert with confusion and left-sided weakness.  NIH stroke scale was only 2 on assessment.  CT head was performed which was negative for acute infarct/hemorrhage.  CT angiogram was negative for LVO.  We did not administer TPA as patient's NIH stroke scale was only 2-for left upper extremity ataxia and drift.  Plan:  MRI brain to assess for stroke. Remaining work-up as noted above.   Karena Addison Namita Yearwood MD [facility] [phone]   If 7pm to 7am, please call on call as listed on AMION.

## 2018-02-27 NOTE — Progress Notes (Signed)
Arrived to ED, Almyra Free, RN has already established site. Pt returning from CT.

## 2018-02-27 NOTE — ED Provider Notes (Signed)
[facility] Provider Note   CSN: [id number] Arrival date & time: 02/27/18  1632     History   Chief Complaint Chief Complaint  Patient presents with  . Code Stroke    HPI Khalik Pewitt is a 73 y.o. male.  73 year old male brought from dialysis due to strokelike symptoms with left upper extremity weakness and inability to stand.  Last known normal 315 this afternoon.  Patient hemodynamically stable on arrival, GCS 15.  The history is provided by the patient.  Cerebrovascular Accident  This is a new problem. The current episode started 1 to 2 hours ago. The problem occurs rarely. The problem has not changed since onset.Associated symptoms include headaches. Pertinent negatives include no chest pain, no abdominal pain and no shortness of breath. Nothing relieves the symptoms. He has tried nothing for the symptoms. The treatment provided no relief.    No past medical history on file.  Patient Active Problem List   Diagnosis Date Noted  . TIA (transient ischemic attack) 02/27/2018  . Elevated troponin 02/27/2018  . Elevated LFTs 02/27/2018  . HTN (hypertension) 02/27/2018     Home Medications    Prior to Admission medications   Medication Sig Start Date End Date Taking? Authorizing Provider  acetaminophen (TYLENOL) 500 MG tablet Take 1,500 mg by mouth daily as needed (pain).    Yes [provider]  amLODipine (NORVASC) 5 MG tablet Take 5 mg by mouth at bedtime. 02/06/18  Yes [provider]  aspirin EC 325 MG tablet Take 325 mg by mouth daily.   Yes [provider]  atorvastatin (LIPITOR) 40 MG tablet Take 40 mg by mouth daily at 6 PM. 12/24/17  Yes [provider]  Calcium Carbonate Antacid (TUMS PO) Take 1-3 mg by mouth See admin instructions. Take 3 tablets by mouth up to three times daily with full meals, take 1 tablet with snacks/small meals   Yes [provider]  famotidine (PEPCID) 20 MG  tablet Take 20 mg by mouth at bedtime. 12/22/17  Yes [provider]  furosemide (LASIX) 40 MG tablet Take 40 mg by mouth 2 (two) times daily. 02/17/18  Yes [provider]  isosorbide mononitrate (IMDUR) 60 MG 24 hr tablet Take 60 mg by mouth daily. 12/25/17  Yes [provider]  losartan (COZAAR) 100 MG tablet Take 100 mg by mouth daily. 02/25/18  Yes [provider]  metoprolol (TOPROL-XL) 200 MG 24 hr tablet Take 200 mg by mouth daily. 10/24/17  Yes [provider]  multivitamin (RENA-VIT) TABS tablet Take 1 tablet by mouth daily.   Yes [provider]  nitroGLYCERIN (NITROSTAT) 0.4 MG SL tablet Place 0.4 mg under the tongue every 5 (five) minutes as needed for chest pain.  12/25/17  Yes [provider]  polyvinyl alcohol (ARTIFICIAL TEARS) 1.4 % ophthalmic solution Place 1 drop into both eyes daily as needed for dry eyes.   Yes [provider]    Family History No family history on file.  Social History Social History   Tobacco Use  . Smoking status: Not on file  Substance Use Topics  . Alcohol use: Not on file  . Drug use: Not on file     Allergies   Metformin and related; Pioglitazone; and Propoxyphene   Review of Systems Review of Systems  Constitutional: Negative for chills and fever.  HENT: Negative for ear pain and sore throat.   Eyes: Negative for pain and visual disturbance.  Respiratory: Negative for cough and shortness of breath.   Cardiovascular: Negative for chest pain and palpitations.  Gastrointestinal: Negative for abdominal pain and vomiting.  Genitourinary: Negative for dysuria and hematuria.  Musculoskeletal: Negative for arthralgias and back pain.  Skin: Negative for color change and rash.  Neurological: Positive for headaches. Negative for seizures and syncope.  All other systems reviewed and are negative.    Physical Exam Updated Vital Signs BP (!) 190/72 (BP Location: Left Arm)    Pulse 70   Temp 98.9 F (37.2 C) (Oral)   Resp 16   Wt 70.3 kg   SpO2 98%   Physical Exam Vitals signs and nursing note reviewed.  Constitutional:      Appearance: He is well-developed.     Comments: Patient fatigued on arrival, indicates that he does not feel well however hemodynamically stable, GCS 15, moving all 4 extremities.  HENT:     Head: Normocephalic and atraumatic.     Comments: Patent airway Eyes:     Extraocular Movements: Extraocular movements intact.     Conjunctiva/sclera: Conjunctivae normal.     Pupils: Pupils are equal, round, and reactive to light.  Neck:     Musculoskeletal: Neck supple.  Cardiovascular:     Rate and Rhythm: Normal rate and regular rhythm.     Heart sounds: No murmur.  Pulmonary:     Effort: Pulmonary effort is normal. No respiratory distress.     Breath sounds: Normal breath sounds.  Abdominal:     Palpations: Abdomen is soft.     Tenderness: There is no abdominal tenderness.  Musculoskeletal:        General: No swelling or tenderness.     Right lower leg: No edema.     Left lower leg: No edema.  Skin:    General: Skin is warm and dry.     Capillary Refill: Capillary refill takes less than 2 seconds.  Neurological:     Mental Status: He is alert and oriented to person, place, and time.     Cranial Nerves: No cranial nerve deficit.     Sensory: No sensory deficit.     Motor: Weakness present.     Coordination: Coordination abnormal.     Gait: Gait normal.     Deep Tendon Reflexes: Reflexes normal.     Comments: 4-5 strength left upper extremity as well as coordination deficit.  Patient is 5 out of 5 right upper and right lower extremity assessment.  Cranial nerves appear to be intact at this time, speech is normal, nonslurred.  Psychiatric:        Mood and Affect: Mood normal.      ED Treatments / Results  Labs (all labs ordered are listed, but only abnormal results are displayed) Labs Reviewed  CBC - Abnormal; Notable  for the following components:      Result Value   RDW 15.6 (*)    All other components within normal limits  DIFFERENTIAL - Abnormal; Notable for the following components:   Eosinophils Absolute 0.7 (*)    All other components within normal limits  COMPREHENSIVE METABOLIC PANEL - Abnormal; Notable for the following components:   Glucose, Bld 134 (*)    BUN 31 (*)    Creatinine, Ser 8.45 (*)    Total Protein 8.9 (*)    Alkaline Phosphatase 141 (*)    Total Bilirubin 1.5 (*)    GFR calc non Af Amer 6 (*)    GFR calc Af Wyvonnia Lora  7 (*)    All other components within normal limits  I-STAT CREATININE, ED - Abnormal; Notable for the following components:   Creatinine, Ser 8.60 (*)    All other components within normal limits  I-STAT TROPONIN, ED - Abnormal; Notable for the following components:   Troponin i, poc 0.09 (*)    All other components within normal limits  ETHANOL  PROTIME-INR  APTT  RAPID URINE DRUG SCREEN, HOSP PERFORMED  URINALYSIS, ROUTINE W REFLEX MICROSCOPIC  HIV ANTIBODY (ROUTINE TESTING W REFLEX)  HEMOGLOBIN A1C  LIPID PANEL  TROPONIN I  TROPONIN I  TROPONIN I  I-STAT TROPONIN, ED  I-STAT TROPONIN, ED  I-STAT TROPONIN, ED    EKG EKG Interpretation  Date/Time:  Friday February 27 2018 16:49:33 EST Ventricular Rate:  74 PR Interval:    QRS Duration: 96 QT Interval:  415 QTC Calculation: 461 R Axis:   11 Text Interpretation:  Sinus rhythm LVH with secondary repolarization abnormality No old tracing to compare Confirmed by Jola Schmidt [phone]) on 02/27/2018 8:09:35 PM   Radiology Ct Angio Head W Or Wo Contrast  Result Date: 02/27/2018 CLINICAL DATA:  Sudden onset left-sided weakness and altered mental status following dialysis. EXAM: CT ANGIOGRAPHY HEAD AND NECK TECHNIQUE: Multidetector CT imaging of the head and neck was performed using the standard protocol during bolus administration of intravenous contrast. Multiplanar CT image reconstructions and MIPs  were obtained to evaluate the vascular anatomy. Carotid stenosis measurements (when applicable) are obtained utilizing NASCET criteria, using the distal internal carotid diameter as the denominator. CONTRAST:  15mL OMNIPAQUE IOHEXOL 300 MG/ML  SOLN COMPARISON:  Head CT 02/27/2018 FINDINGS: CTA NECK FINDINGS SKELETON: There is no bony spinal canal stenosis. No lytic or blastic lesion. OTHER NECK: Normal pharynx, larynx and major salivary glands. No cervical lymphadenopathy. Unremarkable thyroid gland. UPPER CHEST: Mild biapical emphysema AORTIC ARCH: There is moderate calcific atherosclerosis of the aortic arch. There is no aneurysm, dissection or hemodynamically significant stenosis of the visualized ascending aorta and aortic arch. Normal variant aortic arch branching pattern with the left vertebral artery arising independently from the aortic arch. The visualized proximal subclavian arteries are widely patent. RIGHT CAROTID SYSTEM: --Common carotid artery: Widely patent origin without common carotid artery dissection or aneurysm. --Internal carotid artery: No dissection, occlusion or aneurysm. Mild atherosclerotic calcification at the carotid bifurcation without hemodynamically significant stenosis. --External carotid artery: No acute abnormality. LEFT CAROTID SYSTEM: --Common carotid artery: Widely patent origin without common carotid artery dissection or aneurysm. --Internal carotid artery: No dissection, occlusion or aneurysm. There is predominantly noncalcified atherosclerosis extending into the proximal ICA, resulting in 50% stenosis. Small area of likely ulcerative plaque within the distal left ICA. --External carotid artery: No acute abnormality. VERTEBRAL ARTERIES: Codominant configuration. At least moderate stenosis of the right vertebral artery origin. The remainder of the right vertebral artery is normal. Normal left vertebral artery. CTA HEAD FINDINGS POSTERIOR CIRCULATION: --Basilar artery: Normal.  --Posterior cerebral arteries: Both posterior cerebral arteries are partially supplied by the posterior communicating arteries. --Superior cerebellar arteries: Normal. --Inferior cerebellar arteries: Normal anterior and posterior inferior cerebellar arteries. ANTERIOR CIRCULATION: --Intracranial internal carotid arteries: Atherosclerotic calcification of the internal carotid arteries at the skull base without hemodynamically significant stenosis. --Anterior cerebral arteries: Normal. Both A1 segments are present. Patent anterior communicating artery. --Middle cerebral arteries: Normal. --Posterior communicating arteries: Present bilaterally. VENOUS SINUSES: As permitted by contrast timing, patent. ANATOMIC VARIANTS: None DELAYED PHASE: No parenchymal contrast enhancement. Review of the MIP images confirms the above  findings. IMPRESSION: 1. No emergent large vessel occlusion or high-grade intracranial stenosis. 2. Approximately 50% stenosis of the proximal left internal carotid artery. 3. At least moderate stenosis of the right vertebral artery origin. Aortic Atherosclerosis (ICD10-I70.0) and Emphysema (ICD10-J43.9). Electronically Signed   By: Ulyses Jarred M.D.   On: 02/27/2018 18:04   Ct Angio Neck W Or Wo Contrast  Result Date: 02/27/2018 CLINICAL DATA:  Sudden onset left-sided weakness and altered mental status following dialysis. EXAM: CT ANGIOGRAPHY HEAD AND NECK TECHNIQUE: Multidetector CT imaging of the head and neck was performed using the standard protocol during bolus administration of intravenous contrast. Multiplanar CT image reconstructions and MIPs were obtained to evaluate the vascular anatomy. Carotid stenosis measurements (when applicable) are obtained utilizing NASCET criteria, using the distal internal carotid diameter as the denominator. CONTRAST:  138mL OMNIPAQUE IOHEXOL 300 MG/ML  SOLN COMPARISON:  Head CT 02/27/2018 FINDINGS: CTA NECK FINDINGS SKELETON: There is no bony spinal canal  stenosis. No lytic or blastic lesion. OTHER NECK: Normal pharynx, larynx and major salivary glands. No cervical lymphadenopathy. Unremarkable thyroid gland. UPPER CHEST: Mild biapical emphysema AORTIC ARCH: There is moderate calcific atherosclerosis of the aortic arch. There is no aneurysm, dissection or hemodynamically significant stenosis of the visualized ascending aorta and aortic arch. Normal variant aortic arch branching pattern with the left vertebral artery arising independently from the aortic arch. The visualized proximal subclavian arteries are widely patent. RIGHT CAROTID SYSTEM: --Common carotid artery: Widely patent origin without common carotid artery dissection or aneurysm. --Internal carotid artery: No dissection, occlusion or aneurysm. Mild atherosclerotic calcification at the carotid bifurcation without hemodynamically significant stenosis. --External carotid artery: No acute abnormality. LEFT CAROTID SYSTEM: --Common carotid artery: Widely patent origin without common carotid artery dissection or aneurysm. --Internal carotid artery: No dissection, occlusion or aneurysm. There is predominantly noncalcified atherosclerosis extending into the proximal ICA, resulting in 50% stenosis. Small area of likely ulcerative plaque within the distal left ICA. --External carotid artery: No acute abnormality. VERTEBRAL ARTERIES: Codominant configuration. At least moderate stenosis of the right vertebral artery origin. The remainder of the right vertebral artery is normal. Normal left vertebral artery. CTA HEAD FINDINGS POSTERIOR CIRCULATION: --Basilar artery: Normal. --Posterior cerebral arteries: Both posterior cerebral arteries are partially supplied by the posterior communicating arteries. --Superior cerebellar arteries: Normal. --Inferior cerebellar arteries: Normal anterior and posterior inferior cerebellar arteries. ANTERIOR CIRCULATION: --Intracranial internal carotid arteries: Atherosclerotic  calcification of the internal carotid arteries at the skull base without hemodynamically significant stenosis. --Anterior cerebral arteries: Normal. Both A1 segments are present. Patent anterior communicating artery. --Middle cerebral arteries: Normal. --Posterior communicating arteries: Present bilaterally. VENOUS SINUSES: As permitted by contrast timing, patent. ANATOMIC VARIANTS: None DELAYED PHASE: No parenchymal contrast enhancement. Review of the MIP images confirms the above findings. IMPRESSION: 1. No emergent large vessel occlusion or high-grade intracranial stenosis. 2. Approximately 50% stenosis of the proximal left internal carotid artery. 3. At least moderate stenosis of the right vertebral artery origin. Aortic Atherosclerosis (ICD10-I70.0) and Emphysema (ICD10-J43.9). Electronically Signed   By: Ulyses Jarred M.D.   On: 02/27/2018 18:04   Ct Head Code Stroke Wo Contrast  Result Date: 02/27/2018 CLINICAL DATA:  Code stroke.  73 y/o  M; left-sided weakness. EXAM: CT HEAD WITHOUT CONTRAST TECHNIQUE: Contiguous axial images were obtained from the base of the skull through the vertex without intravenous contrast. COMPARISON:  None. FINDINGS: Brain: No evidence of acute infarction, hemorrhage, hydrocephalus, extra-axial collection or mass lesion/mass effect. Nonspecific white matter hypodensities compatible with chronic microvascular  ischemic changes volume loss of the brain. Vascular: Density within right M2 superior division may represent thrombus. Skull: Normal. Negative for fracture or focal lesion. Sinuses/Orbits: No acute finding. Other: None. ASPECTS [facility] Stroke Program Early CT Score) - Ganglionic level infarction (caudate, lentiform nuclei, internal capsule, insula, M1-M3 cortex): 7 - Supraganglionic infarction (M4-M6 cortex): 3 Total score (0-10 with 10 being normal): 10 IMPRESSION: 1. No acute intracranial abnormality identified. 2. Density within right M2 superior division may represent  thrombus. 3. Mild chronic microvascular ischemic changes and volume loss of brain. 4. ASPECTS is 10 These results were called by telephone at the time of interpretation on 02/27/2018 at 4:48 pm to Dr. Lorraine Lax, who verbally acknowledged these results. Electronically Signed   By: Kristine Garbe M.D.   On: 02/27/2018 16:49    Procedures Procedures (including critical care time)  Medications Ordered in ED Medications  acetaminophen (TYLENOL) tablet 650 mg (650 mg Oral Given 02/27/18 2116)  aspirin EC tablet 325 mg (325 mg Oral Not Given 02/27/18 2255)  amLODipine (NORVASC) tablet 5 mg (5 mg Oral Given 02/27/18 2245)  atorvastatin (LIPITOR) tablet 40 mg (40 mg Oral Given 02/27/18 2255)  famotidine (PEPCID) tablet 20 mg (20 mg Oral Given 02/27/18 2245)  multivitamin (RENA-VIT) tablet 1 tablet (1 tablet Oral Given 02/27/18 2255)  polyvinyl alcohol (LIQUIFILM TEARS) 1.4 % ophthalmic solution 1 drop (has no administration in time range)  senna-docusate (Senokot-S) tablet 1 tablet (has no administration in time range)  heparin injection 5,000 Units (5,000 Units Subcutaneous Given 02/27/18 2245)  iohexol (OMNIPAQUE) 300 MG/ML solution 100 mL (100 mLs Intravenous Contrast Given 02/27/18 1748)   stroke: mapping our early stages of recovery book ( Does not apply Given 02/27/18 2245)     Initial Impression / Assessment and Plan / ED Course  I have reviewed the triage vital signs and the nursing notes.  Pertinent labs & imaging results that were available during my care of the patient were reviewed by me and considered in my medical decision making (see chart for details).     MDM  73 year old male significant history listed above significant for dialysis who presents with strokelike symptoms with acute onset today prior to arrival.  Code stroke was activated prior to arrival neurology at bedside, patient GCS 15, speaking with nonslurred speech, physical exam positive for left upper extremity weakness.   Patient denies any other complaints including chest pain, shortness of breath, infectious-like symptoms.  CT imaging does not indicate any acute hemorrhage, stroke at this time.  No indication for thrombectomy or further intervention per neurology.  Patient on further discussion indicates that he had 4 to 5 similar episodes during dialysis.  These episodes of looks like patient was seen at [facility], chart reviewed.  Laboratory studies obtained, indicate mild elevation of troponin 0.09, likely secondary due to hemodialysis, possible demand etiology as patient denies any chest pain.  EKG does not indicate any acute ischemia, patient does have lateral T wave inversion however did not have prior comparison.  Please see attending read above.  Cardiology here in the emergency department both EKG as well as patient story and did not indicate necessity for consultation at this time.  Inpatient team in agreement.  Hospitalist team is to take over care here in the emergency department continue neurology recommendation work-up as inpatient.  Patient, family in agreement with this plan.  Patient stable condition with stable vital signs at time of admission.  The above care was discussed and agreed upon  by my attending physician.  Final Clinical Impressions(s) / ED Diagnoses   Final diagnoses:  Elevated LFTs    ED Discharge Orders    None       Orson Aloe, MD 02/27/18 1798    Jola Schmidt, MD 02/28/18 [phone]

## 2018-02-27 NOTE — Progress Notes (Signed)
Patient is off the floor for MRI.

## 2018-02-28 ENCOUNTER — Observation Stay (HOSPITAL_COMMUNITY): Payer: Medicare HMO

## 2018-02-28 ENCOUNTER — Observation Stay (HOSPITAL_BASED_OUTPATIENT_CLINIC_OR_DEPARTMENT_OTHER): Payer: Medicare HMO

## 2018-02-28 ENCOUNTER — Encounter (HOSPITAL_COMMUNITY): Payer: Self-pay | Admitting: Internal Medicine

## 2018-02-28 DIAGNOSIS — I639 Cerebral infarction, unspecified: Secondary | ICD-10-CM

## 2018-02-28 DIAGNOSIS — N2581 Secondary hyperparathyroidism of renal origin: Secondary | ICD-10-CM | POA: Diagnosis present

## 2018-02-28 DIAGNOSIS — D631 Anemia in chronic kidney disease: Secondary | ICD-10-CM | POA: Diagnosis present

## 2018-02-28 DIAGNOSIS — I214 Non-ST elevation (NSTEMI) myocardial infarction: Secondary | ICD-10-CM | POA: Diagnosis present

## 2018-02-28 DIAGNOSIS — I6501 Occlusion and stenosis of right vertebral artery: Secondary | ICD-10-CM | POA: Diagnosis present

## 2018-02-28 DIAGNOSIS — R7989 Other specified abnormal findings of blood chemistry: Secondary | ICD-10-CM | POA: Diagnosis not present

## 2018-02-28 DIAGNOSIS — I6522 Occlusion and stenosis of left carotid artery: Secondary | ICD-10-CM | POA: Diagnosis not present

## 2018-02-28 DIAGNOSIS — E1121 Type 2 diabetes mellitus with diabetic nephropathy: Secondary | ICD-10-CM | POA: Diagnosis not present

## 2018-02-28 DIAGNOSIS — S069X9A Unspecified intracranial injury with loss of consciousness of unspecified duration, initial encounter: Secondary | ICD-10-CM | POA: Diagnosis present

## 2018-02-28 DIAGNOSIS — Z955 Presence of coronary angioplasty implant and graft: Secondary | ICD-10-CM | POA: Diagnosis not present

## 2018-02-28 DIAGNOSIS — I6523 Occlusion and stenosis of bilateral carotid arteries: Secondary | ICD-10-CM | POA: Diagnosis present

## 2018-02-28 DIAGNOSIS — I351 Nonrheumatic aortic (valve) insufficiency: Secondary | ICD-10-CM | POA: Diagnosis not present

## 2018-02-28 DIAGNOSIS — I6389 Other cerebral infarction: Secondary | ICD-10-CM | POA: Diagnosis not present

## 2018-02-28 DIAGNOSIS — E1122 Type 2 diabetes mellitus with diabetic chronic kidney disease: Secondary | ICD-10-CM | POA: Diagnosis present

## 2018-02-28 DIAGNOSIS — R945 Abnormal results of liver function studies: Secondary | ICD-10-CM | POA: Diagnosis present

## 2018-02-28 DIAGNOSIS — R627 Adult failure to thrive: Secondary | ICD-10-CM | POA: Diagnosis not present

## 2018-02-28 DIAGNOSIS — R0602 Shortness of breath: Secondary | ICD-10-CM | POA: Diagnosis not present

## 2018-02-28 DIAGNOSIS — D62 Acute posthemorrhagic anemia: Secondary | ICD-10-CM | POA: Diagnosis present

## 2018-02-28 DIAGNOSIS — Z992 Dependence on renal dialysis: Secondary | ICD-10-CM

## 2018-02-28 DIAGNOSIS — N2889 Other specified disorders of kidney and ureter: Secondary | ICD-10-CM | POA: Diagnosis present

## 2018-02-28 DIAGNOSIS — N186 End stage renal disease: Secondary | ICD-10-CM

## 2018-02-28 DIAGNOSIS — I255 Ischemic cardiomyopathy: Secondary | ICD-10-CM | POA: Diagnosis present

## 2018-02-28 DIAGNOSIS — I6529 Occlusion and stenosis of unspecified carotid artery: Secondary | ICD-10-CM | POA: Diagnosis not present

## 2018-02-28 DIAGNOSIS — I63419 Cerebral infarction due to embolism of unspecified middle cerebral artery: Secondary | ICD-10-CM | POA: Diagnosis not present

## 2018-02-28 DIAGNOSIS — I340 Nonrheumatic mitral (valve) insufficiency: Secondary | ICD-10-CM

## 2018-02-28 DIAGNOSIS — E119 Type 2 diabetes mellitus without complications: Secondary | ICD-10-CM

## 2018-02-28 DIAGNOSIS — I2510 Atherosclerotic heart disease of native coronary artery without angina pectoris: Secondary | ICD-10-CM | POA: Diagnosis present

## 2018-02-28 DIAGNOSIS — D696 Thrombocytopenia, unspecified: Secondary | ICD-10-CM | POA: Diagnosis not present

## 2018-02-28 DIAGNOSIS — T829XXA Unspecified complication of cardiac and vascular prosthetic device, implant and graft, initial encounter: Secondary | ICD-10-CM | POA: Diagnosis present

## 2018-02-28 DIAGNOSIS — K922 Gastrointestinal hemorrhage, unspecified: Secondary | ICD-10-CM | POA: Diagnosis not present

## 2018-02-28 DIAGNOSIS — G8194 Hemiplegia, unspecified affecting left nondominant side: Secondary | ICD-10-CM | POA: Diagnosis present

## 2018-02-28 DIAGNOSIS — I10 Essential (primary) hypertension: Secondary | ICD-10-CM | POA: Diagnosis not present

## 2018-02-28 DIAGNOSIS — E1151 Type 2 diabetes mellitus with diabetic peripheral angiopathy without gangrene: Secondary | ICD-10-CM | POA: Diagnosis present

## 2018-02-28 DIAGNOSIS — I132 Hypertensive heart and chronic kidney disease with heart failure and with stage 5 chronic kidney disease, or end stage renal disease: Secondary | ICD-10-CM | POA: Diagnosis present

## 2018-02-28 DIAGNOSIS — I63422 Cerebral infarction due to embolism of left anterior cerebral artery: Secondary | ICD-10-CM | POA: Diagnosis present

## 2018-02-28 DIAGNOSIS — R29702 NIHSS score 2: Secondary | ICD-10-CM | POA: Diagnosis present

## 2018-02-28 DIAGNOSIS — D638 Anemia in other chronic diseases classified elsewhere: Secondary | ICD-10-CM | POA: Diagnosis not present

## 2018-02-28 DIAGNOSIS — S069XAA Unspecified intracranial injury with loss of consciousness status unknown, initial encounter: Secondary | ICD-10-CM | POA: Diagnosis present

## 2018-02-28 DIAGNOSIS — G459 Transient cerebral ischemic attack, unspecified: Secondary | ICD-10-CM | POA: Diagnosis present

## 2018-02-28 DIAGNOSIS — E785 Hyperlipidemia, unspecified: Secondary | ICD-10-CM | POA: Diagnosis present

## 2018-02-28 DIAGNOSIS — I25110 Atherosclerotic heart disease of native coronary artery with unstable angina pectoris: Secondary | ICD-10-CM | POA: Diagnosis not present

## 2018-02-28 DIAGNOSIS — T82855A Stenosis of coronary artery stent, initial encounter: Secondary | ICD-10-CM | POA: Diagnosis present

## 2018-02-28 DIAGNOSIS — I5022 Chronic systolic (congestive) heart failure: Secondary | ICD-10-CM | POA: Diagnosis present

## 2018-02-28 DIAGNOSIS — I159 Secondary hypertension, unspecified: Secondary | ICD-10-CM | POA: Diagnosis not present

## 2018-02-28 DIAGNOSIS — J9601 Acute respiratory failure with hypoxia: Secondary | ICD-10-CM | POA: Diagnosis not present

## 2018-02-28 DIAGNOSIS — I509 Heart failure, unspecified: Secondary | ICD-10-CM

## 2018-02-28 LAB — HEMOGLOBIN A1C
Hgb A1c MFr Bld: 6.6 % — ABNORMAL HIGH (ref 4.8–5.6)
Mean Plasma Glucose: 142.72 mg/dL

## 2018-02-28 LAB — TROPONIN I
TROPONIN I: 0.26 ng/mL — AB (ref <0.03)
Troponin I: 0.22 ng/mL (ref <0.03)

## 2018-02-28 LAB — LIPID PANEL
Cholesterol: 106 mg/dL (ref 0–200)
HDL: 36 mg/dL — ABNORMAL LOW (ref >40)
LDL Cholesterol: 48 mg/dL (ref 0–99)
Total CHOL/HDL Ratio: 2.9 RATIO
Triglycerides: 108 mg/dL (ref <150)
VLDL: 22 mg/dL (ref 0–40)

## 2018-02-28 LAB — ECHOCARDIOGRAM COMPLETE: Weight: 2480 oz

## 2018-02-28 MED ORDER — CLOPIDOGREL BISULFATE 75 MG PO TABS
75.0000 mg | ORAL_TABLET | Freq: Every day | ORAL | Status: DC
  Administered 2018-02-28 – 2018-03-11 (×11): 75 mg via ORAL
  Filled 2018-02-28 (×12): qty 1

## 2018-02-28 NOTE — Progress Notes (Signed)
Patient arrived the floor alert and oriented, accompanied by his daughter. Educated accordingly about safety concerns. Instructed to use call bell for assistance before getting out of bed and also whenever needs arises. Bed positioned in the lowest, call bell within reach. MD notified about elevated BP. Will continue to monitor.

## 2018-02-28 NOTE — Progress Notes (Signed)
MD notified of patient troponin value of 0.22ng/ml

## 2018-02-28 NOTE — Progress Notes (Signed)
Rehab Admissions Coordinator Note:  Patient was screened by Cleatrice Burke for appropriateness for an Inpatient Acute Rehab Consult per OT recs. .  At this time, we are recommending Inpatient Rehab consult. Please place order for consult.  Cleatrice Burke 02/28/2018, 1:26 PM  I can be reached at [phone].

## 2018-02-28 NOTE — Evaluation (Signed)
Occupational Therapy Evaluation Patient Details Name: Peter [name] MRN: [id number] DOB: 14-Jan-1946 Today's Date: 02/28/2018    History of Present Illness Pt is a 73 y.o. male admitted 02/27/18 with L-sided weakness while at dialysis. MRI shows small volume diffusion abnormality involving parasagittal cerebral hemispheres, additional L frontal cortical involvement compatible with acute ischemia; remote bilat cerebellar infarcts. PMH includes HTN, ESRD on HD.   Clinical Impression   PTA, pt was living alone and was independent. Pt with good family support and daughter reports she will be able to assist at dc. Pt currently requiring Min A for UB ADLs, Mod A for LB ADLs, and Min-Mod A +2 for functional transfers. Pt presenting with poor balance, strength, functional use of LUE, cognition, and vision. Pt with inattention to both his LUE and left side of environment running into multiple objects on left side despite Max cues. Pt motivated to participate in therapy and return to PLOF. Pt will require further acute OT to facilitate safe dc. Recommend dc to CIR for intensive OT to optimize safety, independence with ADLs, and return to PLOF.      Follow Up Recommendations  CIR;Supervision/Assistance - 24 hour    Equipment Recommendations  Other (comment)(Defer to next venue)    Recommendations for Other Services Rehab consult;PT consult;Speech consult     Precautions / Restrictions Precautions Precautions: Fall Precaution Comments: Inattention to left side (body and environment) Restrictions Weight Bearing Restrictions: No      Mobility Bed Mobility Overal bed mobility: Needs Assistance Bed Mobility: Supine to Sit     Supine to sit: Min guard;HOB elevated     General bed mobility comments: Min Guard A for safety. Pt overreaching to the right when reaching out to bed rail with RUE.   Transfers Overall transfer level: Needs assistance Equipment used: Rolling walker (2  wheeled) Transfers: Sit to/from Stand Sit to Stand: Mod assist;From elevated surface;+2 safety/equipment         General transfer comment: Mod A to gain balance and prevent falling to right.    Balance Overall balance assessment: Needs assistance Sitting-balance support: No upper extremity supported;Feet supported Sitting balance-Leahy Scale: Fair Sitting balance - Comments: During dual tasks, pt leaning right and requiring cues for upright posture Postural control: Right lateral lean Standing balance support: Bilateral upper extremity supported;During functional activity Standing balance-Leahy Scale: Poor Standing balance comment: Reliant on UE support and physical A                           ADL either performed or assessed with clinical judgement   ADL Overall ADL's : Needs assistance/impaired Eating/Feeding: Minimal assistance;Sitting Eating/Feeding Details (indicate cue type and reason): Min A for bilateral FM tasks Grooming: Minimal assistance;Sitting Grooming Details (indicate cue type and reason): Min A for bilateral tasks with poor functional use of LUE Upper Body Bathing: Minimal assistance;Sitting   Lower Body Bathing: Moderate assistance;Sit to/from stand   Upper Body Dressing : Minimal assistance;Sitting Upper Body Dressing Details (indicate cue type and reason): donned left arm first and required Max cues for targeted reach through sleeve. Pt overshooting LUE to the right of sleeve hole.  Lower Body Dressing: Moderate assistance;Sit to/from stand   Toilet Transfer: Minimal assistance;Ambulation;RW(simulated to recliner)           Functional mobility during ADLs: Minimal assistance;Rolling walker(Max cues for RW management with inattention to left side) General ADL Comments: Pt presenting with decreased strength, balance, functional use of LUE, cognition (  with slow processing), and vision deficits.      Vision Baseline Vision/History: Wears  glasses Wears Glasses: At all times Patient Visual Report: No change from baseline;Other (comment)(Pt reports no changes adn denies blurry vision or dipolia) Vision Assessment?: Yes Eye Alignment: Within Functional Limits Ocular Range of Motion: Impaired-to be further tested in functional context(Poor tracking vertically) Alignment/Gaze Preference: Head turned(tendency for head turn to right) Tracking/Visual Pursuits: Decreased smoothness of horizontal tracking;Decreased smoothness of vertical tracking;Requires cues, head turns, or add eye shifts to track;Unable to hold eye position out of midline;Impaired - to be further tested in functional context(Poor tracking vertically. noting jumpy movements throughout) Visual Fields: Other (comment)(Difficult to assess due to decreased visual attention) Diplopia Assessment: Other (comment)(Denies it but over reaches to the right at times) Depth Perception: Overshoots(Right) Additional Comments: Pt demonstrating decreased smooth tracking and difficult maintaining attention to complete tracking tasks requiring increased cues. Pt also presenting with poor vertical tracking. Noting pt running into objects on left side and walkign on right side of walking when mobilizing.      Perception     Praxis      Pertinent Vitals/Pain Pain Assessment: 0-10 Pain Score: 7  Pain Location: Headache Pain Descriptors / Indicators: Headache Pain Intervention(s): Limited activity within patient's tolerance;Monitored during session;Repositioned     Hand Dominance Right   Extremity/Trunk Assessment Upper Extremity Assessment Upper Extremity Assessment: LUE deficits/detail LUE Deficits / Details: Decreased attention to LUE. Poor grasp strength and AROM. No note noted during PROM. Pt able to perform shoulder flexion to 80 degrees. Noting overshooting to right.  LUE Coordination: decreased fine motor;decreased gross motor   Lower Extremity Assessment Lower Extremity  Assessment: Defer to PT evaluation   Cervical / Trunk Assessment Cervical / Trunk Assessment: Other exceptions Cervical / Trunk Exceptions: Forward flexion of shoulders and neck   Communication Communication Communication: No difficulties   Cognition Arousal/Alertness: Awake/alert Behavior During Therapy: WFL for tasks assessed/performed Overall Cognitive Status: Impaired/Different from baseline Area of Impairment: Attention;Memory;Following commands;Awareness;Problem solving                   Current Attention Level: Selective Memory: Decreased short-term memory Following Commands: Follows one step commands with increased time   Awareness: Emergent Problem Solving: Slow processing;Requires verbal cues General Comments: Pt requiring significant time throughout to process and follow commands. Requiring increased cues to problem solving ADLs with poor awareness of deficits. Pt also becoming tearful during session   General Comments  Daughter present throughout    Exercises Exercises: Other exercises Other Exercises Other Exercises: Educated pt and daughter on neurplasticity and benefits of using LUE throughout ADLs   Shoulder Instructions      Home Living Family/patient expects to be discharged to:: Private residence Living Arrangements: Alone Available Help at Discharge: Family Type of Home: House Home Access: Stairs to enter Technical brewer of Steps: 3 Entrance Stairs-Rails: Right;Left Home Layout: One level     Bathroom Shower/Tub: Teacher, early years/pre: Handicapped height     [place]: Shower seat;Grab bars - toilet;Walker - 2 wheels;Cane - single point          Prior Functioning/Environment Level of Independence: Independent with assistive device(s)        Comments: SPC for household ambulation (holds cane in L hand). RW community ambulation. Drives. Indep with ADLs; assist for some household tasks (i.e. cooking). Indep  manage meds        OT Problem List: Decreased strength;Decreased range of motion;Decreased activity  tolerance;Impaired balance (sitting and/or standing);Impaired vision/perception;Decreased coordination;Decreased cognition;Decreased safety awareness;Decreased knowledge of use of DME or AE;Decreased knowledge of precautions;Impaired UE functional use;Pain      OT Treatment/Interventions: Self-care/ADL training;Therapeutic exercise;Energy conservation;DME and/or AE instruction;Therapeutic activities;Patient/family education    OT Goals(Current goals can be found in the care plan section) Acute Rehab OT Goals Patient Stated Goal: Return to normal OT Goal Formulation: With patient/family Time For Goal Achievement: 03/14/18 Potential to Achieve Goals: Good  OT Frequency: Min 3X/week   Barriers to D/C:            Co-evaluation PT/OT/SLP Co-Evaluation/Treatment: Yes Reason for Co-Treatment: To address functional/ADL transfers   OT goals addressed during session: ADL's and self-care      AM-PAC OT "6 Clicks" Daily Activity     Outcome Measure Help from another person eating meals?: A Little Help from another person taking care of personal grooming?: A Little Help from another person toileting, which includes using toliet, bedpan, or urinal?: A Lot Help from another person bathing (including washing, rinsing, drying)?: A Lot Help from another person to put on and taking off regular upper body clothing?: A Little Help from another person to put on and taking off regular lower body clothing?: A Lot 6 Click Score: 15   End of Session Equipment Utilized During Treatment: Gait belt;Rolling walker Nurse Communication: Mobility status;Other (comment)(Pt hungry)  Activity Tolerance: Patient tolerated treatment well Patient left: in chair;with call bell/phone within reach;with chair alarm set;with family/visitor present  OT Visit Diagnosis: Unsteadiness on feet (R26.81);Other abnormalities  of gait and mobility (R26.89);Muscle weakness (generalized) (M62.81);Other symptoms and signs involving cognitive function;Pain;Hemiplegia and hemiparesis;Low vision, both eyes (H54.2) Hemiplegia - Right/Left: Left Hemiplegia - dominant/non-dominant: Non-Dominant Hemiplegia - caused by: Cerebral infarction Pain - part of body: (HA)                Time: 1010-1048 OT Time Calculation (min): 38 min Charges:  OT General Charges $OT Visit: 1 Visit OT Evaluation $OT Eval Moderate Complexity: 1 Mod OT Treatments $Self Care/Home Management : 8-22 mins  Robbyn Hodkinson MSOT, OTR/L Acute Rehab Pager: [phone] Office: [place] 02/28/2018, 11:52 AM

## 2018-02-28 NOTE — Progress Notes (Signed)
[facility] PROGRESS NOTE  Peter [name] HKV:[id number] DOB: 1947/06/71 DOA: 02/27/2069 PCP: Patient, No Pcp Per  Assessment/Plan: Acute CVA. Patient presented with complains of left-sided weakness/tingling after dialysis yesterday. Code stroke was called.  Patient was seen by neurology and decision was made not to give TPA. CT head showing no acute intracranial abnormality; density within the right M2 superior division may represent thrombus. CT angiogram head and neck showing no emergent large vessel occlusion or high-grade intracranial stenosis.  Approximately 50% stenosis of the proximal left internal carotid artery and moderate stenosis of the right vertebral artery origin. MRI showing scattered small volume diffusion abnormality involving the parasagittal cerebral hemispheres bilaterally near the vertex, right greater than left, with additional mild left frontal cortical involvement as above, compatible with acute ischemia. LDL 48, HgA1c 6.6   Symptoms improving this am.   - follow Echocardiogram - continue ASA and statin -High intensity Statinif LDL > 70 - await PT consult, OT consult, Speech consult - no events on tele.  -await neuro recommendations -?candidate for CIR  Elevated troponin and abnormal EKG. Likely due to demand ischemia from dialysis. Troponin 0.09 >0.22 >0.26 >0.22. EKG with T wave inversions in inferior and lateral leads.Patient continues to be chest pain-free and appears comfortable on exam. -follow Echocardiogram -ED provider discussed the case with cardiology.  No recommendations.  ESRD. Patient on PD for 4 years prior to MI 11/2017 leading to prolonged hospitalization as complicated by PAE after cath. During this time transitioned to HD and has been MWF dialysis. Would like to transition back. However, chart review indicates one attempt at that led to uremia so HD resumed. Creatinine 8.6, potassium 4.1  -notify nephrology   Right kidney mass. Incidental  finding on abdominal US per report indeterminate small mass in the lower pole of the right kidney. This could reflect a complex cyst or solid lesion. Further evaluation recommended. Given the patient's renal insufficiency, noncontrast renal MRI is likely the best available imaging option. -? Renal MRI non contrast  Elevated LFTs Alkaline phosphatase 141, T bili 1.5.  AST and ALT normal.  Abdominal exam benign.  No recent labs for comparison. Right upper quadrant ultrasound reveals No evidence of gallstones, biliary dilatation or cholecystitis.Tiny gallbladder polyp. The liver appears unremarkable. I  Hypertension. Controlled. Home meds include norvasc, lasix, imdur, losartan, metoprolol -Allow permissive hypertension -holding lasix, metoprolol and losartan   Code Status: full Family Communication: daughter at bedside Disposition Plan: likely rehab   Consultants:  [facility] neurology  Procedures:  Echo  CTA  Antibiotics:    HPI/Subjective: Hx MI, DM, CHF, cad s/p cath, MI PAE, presented with left sided weakness after dialysis yesterday. Imaging consistent with acute ischemia, elevated trop, right kidney mass  Awake lethargic oriented to place and self. Follows commands. No acute distress  Objective: Vitals:   02/28/18 0402 02/28/18 0731  BP: (!) 180/52 (!) 170/58  Pulse: 72 72  Resp: 18 16  Temp: 98.2 F (36.8 C) 97.6 F (36.4 C)  SpO2: 100% 97%    Intake/Output Summary (Last 24 hours) at 02/28/2018 1116 Last data filed at 02/27/2018 2300 Gross per 24 hour  Intake 120 ml  Output -  Net 120 ml   Filed Weights   02/27/18 1700  Weight: 70.3 kg    Exam:   General:  Lethargic but arousable, no acute distress  Cardiovascular: rrr no mgr no LE edema  Respiratory: normal effort BS clear bilaterally no wheeze  Abdomen: flat soft +BS no guarding or  rebounding  Musculoskeletal: joints without swelling/erythema  Neuro: somewhat drowsy, follows simple  commands, speech clear but slow, word searching. Left grip 2/5 right grip 4/5 left LE strength 3/5 right LE strength 4/5. Tongue minline.    Data Reviewed: Basic Metabolic Panel: Recent Labs  Lab 02/27/18 1658 02/27/18 1703  NA 139  --   K 4.1  --   CL 101  --   CO2 24  --   GLUCOSE 134*  --   BUN 31*  --   CREATININE 8.45* 8.60*  CALCIUM 9.3  --    Liver Function Tests: Recent Labs  Lab 02/27/18 1658  AST 28  ALT 42  ALKPHOS 141*  BILITOT 1.5*  PROT 8.9*  ALBUMIN 3.5   No results for input(s): LIPASE, AMYLASE in the last 168 hours. No results for input(s): AMMONIA in the last 168 hours. CBC: Recent Labs  Lab 02/27/18 1658  WBC 7.1  NEUTROABS 4.2  HGB 13.2  HCT 41.9  MCV 91.1  PLT 194   Cardiac Enzymes: Recent Labs  Lab 02/27/18 2251 02/28/18 0207 02/28/18 0752  TROPONINI 0.22* 0.26* 0.22*   BNP (last 3 results) No results for input(s): BNP in the last 8760 hours.  ProBNP (last 3 results) No results for input(s): PROBNP in the last 8760 hours.  CBG: No results for input(s): GLUCAP in the last 168 hours.  No results found for this or any previous visit (from the past 240 hour(s)).   Studies: Ct Angio Head W Or Wo Contrast  Result Date: 02/27/2018 CLINICAL DATA:  Sudden onset left-sided weakness and altered mental status following dialysis. EXAM: CT ANGIOGRAPHY HEAD AND NECK TECHNIQUE: Multidetector CT imaging of the head and neck was performed using the standard protocol during bolus administration of intravenous contrast. Multiplanar CT image reconstructions and MIPs were obtained to evaluate the vascular anatomy. Carotid stenosis measurements (when applicable) are obtained utilizing NASCET criteria, using the distal internal carotid diameter as the denominator. CONTRAST:  127mL OMNIPAQUE IOHEXOL 300 MG/ML  SOLN COMPARISON:  Head CT 02/27/2018 FINDINGS: CTA NECK FINDINGS SKELETON: There is no bony spinal canal stenosis. No lytic or blastic lesion. OTHER  NECK: Normal pharynx, larynx and major salivary glands. No cervical lymphadenopathy. Unremarkable thyroid gland. UPPER CHEST: Mild biapical emphysema AORTIC ARCH: There is moderate calcific atherosclerosis of the aortic arch. There is no aneurysm, dissection or hemodynamically significant stenosis of the visualized ascending aorta and aortic arch. Normal variant aortic arch branching pattern with the left vertebral artery arising independently from the aortic arch. The visualized proximal subclavian arteries are widely patent. RIGHT CAROTID SYSTEM: --Common carotid artery: Widely patent origin without common carotid artery dissection or aneurysm. --Internal carotid artery: No dissection, occlusion or aneurysm. Mild atherosclerotic calcification at the carotid bifurcation without hemodynamically significant stenosis. --External carotid artery: No acute abnormality. LEFT CAROTID SYSTEM: --Common carotid artery: Widely patent origin without common carotid artery dissection or aneurysm. --Internal carotid artery: No dissection, occlusion or aneurysm. There is predominantly noncalcified atherosclerosis extending into the proximal ICA, resulting in 50% stenosis. Small area of likely ulcerative plaque within the distal left ICA. --External carotid artery: No acute abnormality. VERTEBRAL ARTERIES: Codominant configuration. At least moderate stenosis of the right vertebral artery origin. The remainder of the right vertebral artery is normal. Normal left vertebral artery. CTA HEAD FINDINGS POSTERIOR CIRCULATION: --Basilar artery: Normal. --Posterior cerebral arteries: Both posterior cerebral arteries are partially supplied by the posterior communicating arteries. --Superior cerebellar arteries: Normal. --Inferior cerebellar arteries: Normal anterior and posterior  inferior cerebellar arteries. ANTERIOR CIRCULATION: --Intracranial internal carotid arteries: Atherosclerotic calcification of the internal carotid arteries at the  skull base without hemodynamically significant stenosis. --Anterior cerebral arteries: Normal. Both A1 segments are present. Patent anterior communicating artery. --Middle cerebral arteries: Normal. --Posterior communicating arteries: Present bilaterally. VENOUS SINUSES: As permitted by contrast timing, patent. ANATOMIC VARIANTS: None DELAYED PHASE: No parenchymal contrast enhancement. Review of the MIP images confirms the above findings. IMPRESSION: 1. No emergent large vessel occlusion or high-grade intracranial stenosis. 2. Approximately 50% stenosis of the proximal left internal carotid artery. 3. At least moderate stenosis of the right vertebral artery origin. Aortic Atherosclerosis (ICD10-I70.0) and Emphysema (ICD10-J43.9). Electronically Signed   By: Ulyses Jarred M.D.   On: 02/27/2018 18:04   Ct Angio Neck W Or Wo Contrast  Result Date: 02/27/2018 CLINICAL DATA:  Sudden onset left-sided weakness and altered mental status following dialysis. EXAM: CT ANGIOGRAPHY HEAD AND NECK TECHNIQUE: Multidetector CT imaging of the head and neck was performed using the standard protocol during bolus administration of intravenous contrast. Multiplanar CT image reconstructions and MIPs were obtained to evaluate the vascular anatomy. Carotid stenosis measurements (when applicable) are obtained utilizing NASCET criteria, using the distal internal carotid diameter as the denominator. CONTRAST:  133mL OMNIPAQUE IOHEXOL 300 MG/ML  SOLN COMPARISON:  Head CT 02/27/2018 FINDINGS: CTA NECK FINDINGS SKELETON: There is no bony spinal canal stenosis. No lytic or blastic lesion. OTHER NECK: Normal pharynx, larynx and major salivary glands. No cervical lymphadenopathy. Unremarkable thyroid gland. UPPER CHEST: Mild biapical emphysema AORTIC ARCH: There is moderate calcific atherosclerosis of the aortic arch. There is no aneurysm, dissection or hemodynamically significant stenosis of the visualized ascending aorta and aortic arch.  Normal variant aortic arch branching pattern with the left vertebral artery arising independently from the aortic arch. The visualized proximal subclavian arteries are widely patent. RIGHT CAROTID SYSTEM: --Common carotid artery: Widely patent origin without common carotid artery dissection or aneurysm. --Internal carotid artery: No dissection, occlusion or aneurysm. Mild atherosclerotic calcification at the carotid bifurcation without hemodynamically significant stenosis. --External carotid artery: No acute abnormality. LEFT CAROTID SYSTEM: --Common carotid artery: Widely patent origin without common carotid artery dissection or aneurysm. --Internal carotid artery: No dissection, occlusion or aneurysm. There is predominantly noncalcified atherosclerosis extending into the proximal ICA, resulting in 50% stenosis. Small area of likely ulcerative plaque within the distal left ICA. --External carotid artery: No acute abnormality. VERTEBRAL ARTERIES: Codominant configuration. At least moderate stenosis of the right vertebral artery origin. The remainder of the right vertebral artery is normal. Normal left vertebral artery. CTA HEAD FINDINGS POSTERIOR CIRCULATION: --Basilar artery: Normal. --Posterior cerebral arteries: Both posterior cerebral arteries are partially supplied by the posterior communicating arteries. --Superior cerebellar arteries: Normal. --Inferior cerebellar arteries: Normal anterior and posterior inferior cerebellar arteries. ANTERIOR CIRCULATION: --Intracranial internal carotid arteries: Atherosclerotic calcification of the internal carotid arteries at the skull base without hemodynamically significant stenosis. --Anterior cerebral arteries: Normal. Both A1 segments are present. Patent anterior communicating artery. --Middle cerebral arteries: Normal. --Posterior communicating arteries: Present bilaterally. VENOUS SINUSES: As permitted by contrast timing, patent. ANATOMIC VARIANTS: None DELAYED PHASE:  No parenchymal contrast enhancement. Review of the MIP images confirms the above findings. IMPRESSION: 1. No emergent large vessel occlusion or high-grade intracranial stenosis. 2. Approximately 50% stenosis of the proximal left internal carotid artery. 3. At least moderate stenosis of the right vertebral artery origin. Aortic Atherosclerosis (ICD10-I70.0) and Emphysema (ICD10-J43.9). Electronically Signed   By: Ulyses Jarred M.D.   On: 02/27/2018 18:04  [name]  Result Date: 02/28/2018 CLINICAL DATA:  Initial evaluation for acute left-sided weakness. EXAM: MRI HEAD WITHOUT CONTRAST TECHNIQUE: Multiplanar, multiecho pulse sequences of the brain and surrounding structures were obtained without intravenous contrast. COMPARISON:  Prior CT and CTA from earlier same day. FINDINGS: Brain: Generalized age-related cerebral atrophy. Mild T2/FLAIR hyperintensity within the periventricular and deep white matter both cerebral hemispheres, nonspecific, but most like related chronic microvascular ischemic disease, minimal for age. Few scattered small remote bilateral cerebellar infarcts noted. Subtle scattered diffusion abnormality seen involving the parasagittal cortical gray matter of the high right frontoparietal region (series 5, images 87, 84, 83). Additional subtle diffusion abnormality seen involving the parasagittal left frontal parietal region, left cingulate (series 5, image 81). Small focus of diffusion abnormality at the anterior left frontal lobe (series 5, image 80). Finding compatible with small acute ischemic infarcts, ACA and/or ACA/MCA distributions. No associated hemorrhage or mass effect. Gray-white matter differentiation otherwise maintained. No other evidence for remote cortical infarction. No foci of susceptibility artifact to suggest acute or chronic intracranial hemorrhage. No mass lesion, midline shift or mass effect. No hydrocephalus. No extra-axial fluid collection. Pituitary gland  within normal limits. Vascular: Major intracranial vascular flow voids maintained at the skull base. Diminutive vertebrobasilar system noted. Skull and upper cervical spine: Craniocervical junction normal. Upper cervical spine within normal limits. Bone marrow signal intensity normal. No scalp soft tissue abnormality. Sinuses/Orbits: Globes and orbital soft tissues within normal limits. Mild scattered mucosal thickening within the ethmoidal air cells. Paranasal sinuses are otherwise largely clear. Trace right mastoid effusion, of doubtful significance. Inner ear structures grossly normal. Other: None. IMPRESSION: 1. Scattered small volume diffusion abnormality involving the parasagittal cerebral hemispheres bilaterally near the vertex, right greater than left, with additional mild left frontal cortical involvement as above, compatible with acute ischemia. Changes are largely ACA distribution, with some MCA and/or ACA-MCA watershed distribution. 2. Few small remote bilateral cerebellar infarcts. 3. Underlying age-related cerebral atrophy with mild chronic microvascular ischemic disease. Electronically Signed   By: Jeannine Boga M.D.   On: 02/28/2018 01:02   Ct Head Code Stroke Wo Contrast  Result Date: 02/27/2018 CLINICAL DATA:  Code stroke.  73 y/o  M; left-sided weakness. EXAM: CT HEAD WITHOUT CONTRAST TECHNIQUE: Contiguous axial images were obtained from the base of the skull through the vertex without intravenous contrast. COMPARISON:  None. FINDINGS: Brain: No evidence of acute infarction, hemorrhage, hydrocephalus, extra-axial collection or mass lesion/mass effect. Nonspecific white matter hypodensities compatible with chronic microvascular ischemic changes volume loss of the brain. Vascular: Density within right M2 superior division may represent thrombus. Skull: Normal. Negative for fracture or focal lesion. Sinuses/Orbits: No acute finding. Other: None. ASPECTS [facility] Stroke Program Early CT  Score) - Ganglionic level infarction (caudate, lentiform nuclei, internal capsule, insula, M1-M3 cortex): 7 - Supraganglionic infarction (M4-M6 cortex): 3 Total score (0-10 with 10 being normal): 10 IMPRESSION: 1. No acute intracranial abnormality identified. 2. Density within right M2 superior division may represent thrombus. 3. Mild chronic microvascular ischemic changes and volume loss of brain. 4. ASPECTS is 10 These results were called by telephone at the time of interpretation on 02/27/2018 at 4:48 pm to Dr. Lorraine Lax, who verbally acknowledged these results. Electronically Signed   By: Kristine Garbe M.D.   On: 02/27/2018 16:49   US Abdomen Limited Ruq  Result Date: 02/28/2018 CLINICAL DATA:  28-year-old with elevated liver function studies. EXAM: ULTRASOUND ABDOMEN LIMITED RIGHT UPPER QUADRANT COMPARISON:  None. FINDINGS: Gallbladder: The gallbladder  is mildly distended. There is a 3 mm gallbladder polyp. No gallstones, gallbladder wall thickening or sonographic Murphy's sign. Common bile duct: Diameter: 4 mm Liver: No focal lesion identified. Within normal limits in parenchymal echogenicity. Portal vein is patent on color Doppler imaging with normal direction of blood flow towards the liver. Incidental imaging of the right kidney demonstrates a small cyst in the lower pole measuring 8 mm maximally as well as a complex adjacent echogenic lesion measuring 12 x 10 x 10 mm. This demonstrates no gross blood flow on this limited examination. Echogenic shadowing foci are present in the mid right kidney consistent with calculi. IMPRESSION: 1. No evidence of gallstones, biliary dilatation or cholecystitis. Tiny gallbladder polyp. 2. The liver appears unremarkable. 3. Indeterminate small mass in the lower pole of the right kidney. This could reflect a complex cyst or solid lesion. Further evaluation recommended. Given the patient's renal insufficiency, noncontrast renal MRI is likely the best available  imaging option. Electronically Signed   By: Richardean Sale M.D.   On: 02/28/2018 09:01    Scheduled Meds: . amLODipine  5 mg Oral QHS  . aspirin EC  325 mg Oral Daily  . atorvastatin  40 mg Oral q1800  . famotidine  20 mg Oral QHS  . heparin  5,000 Units Subcutaneous Q8H  . multivitamin  1 tablet Oral Daily   Continuous Infusions:  Principal Problem:   Acute CVA (cerebrovascular accident) ([place]) Active Problems:   Elevated troponin   ESRD on dialysis (H[facility])   CHF (congestive heart failure) (H[facility])   Elevated LFTs   HTN (hypertension)   CAD (coronary artery disease)   Diabetes ([place])   Traumatic brain injury ([place])   Anemia   Right kidney mass    Time spent: 84 minutes    [facility]  If 7PM-7AM, please contact night-coverage at [email or web address], password [facility] 02/28/2018, 11:16 AM  LOS: 0 days

## 2018-02-28 NOTE — Progress Notes (Signed)
STROKE TEAM PROGRESS NOTE   HISTORY OF PRESENT ILLNESS (per record) Peter [name] is an 73 y.o. 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   Per EMS patient was finished with dialysis ( they removed 5 pounds), he had a sudden onset of left sided weakness and confusion and an episode of staring off. Patient did receive a heparin bolus. Patient usually drives himself to and from dialysis. Denies any CP, SOB, weakness.  ED course:  CTH: no hemorrhage; density within right M2 BG: 112 BP: 190 PTT: 28 INR:1.08 PT: 13.9 CTA: no LVO   Date last known well: 02-27-2018 Time last known well: 1515 tPA Given: no; too mild to treat Modified Rankin: Rankin Score=0 NIHSS:2 No past medical history on file.   SUBJECTIVE (INTERVAL HISTORY) Therapists working with patient. The patient believes he had afib in the past; although, his daughter is not sure. Pt has a hx of a cardiac stent and was on Brilinta in the past. His dghtr is supportive and may be able to care for the patient after discharge.   OBJECTIVE Vitals:   02/28/18 0040 02/28/18 0230 02/28/18 0402 02/28/18 0731  BP: (!) 172/54 (!) 165/47 (!) 180/52 (!) 170/58  Pulse: 70 70 72 72  Resp: 18 18 18 16   Temp: 98.4 F (36.9 C) 98.2 F (36.8 C) 98.2 F (36.8 C) 97.6 F (36.4 C)  TempSrc: Oral Oral Oral Oral  SpO2: 98% 98% 100% 97%  Weight:        CBC:  Recent Labs  Lab 02/27/18 1658  WBC 7.1  NEUTROABS 4.2  HGB 13.2  HCT 41.9  MCV 91.1  PLT 294    Basic Metabolic Panel:  Recent Labs  Lab 02/27/18 1658 02/27/18 1703  NA 139  --   K 4.1  --   CL 101  --   CO2 24  --   GLUCOSE 134*  --   BUN 31*  --   CREATININE 8.45* 8.60*  CALCIUM 9.3  --     Lipid Panel:     Component Value Date/Time   CHOL 106 02/28/2018 0207   TRIG 108 02/28/2018 0207   HDL 36 (L) 02/28/2018 0207   CHOLHDL 2.9 02/28/2018 0207   VLDL 22 02/28/2018 0207   LDLCALC  48 02/28/2018 0207   HgbA1c:  Lab Results  Component Value Date   HGBA1C 6.6 (H) 02/28/2018   Urine Drug Screen: No results found for: LABOPIA, COCAINSCRNUR, LABBENZ, AMPHETMU, THCU, LABBARB  Alcohol Level     Component Value Date/Time   ETH <10 02/27/2018 1658    IMAGING  Ct Angio Head W Or Wo Contrast Ct Angio Neck W Or Wo Contrast 02/27/2018 IMPRESSION:  1. No emergent large vessel occlusion or high-grade intracranial stenosis.  2. Approximately 50% stenosis of the proximal left internal carotid artery.  3. At least moderate stenosis of the right vertebral artery origin.   Aortic Atherosclerosis (ICD10-I70.0) and Emphysema (ICD10-J43.9).    [name] 02/28/2018 IMPRESSION:  1. Scattered small volume diffusion abnormality involving the parasagittal cerebral hemispheres bilaterally near the vertex, right greater than left, with additional mild left frontal cortical involvement as above, compatible with acute ischemia. Changes are largely ACA distribution, with some MCA and/or ACA-MCA watershed distribution.  2. Few small remote bilateral cerebellar infarcts.   3. Underlying age-related cerebral atrophy with mild chronic microvascular ischemic disease.  Ct Head Code Stroke Wo Contrast 02/27/2018 IMPRESSION:  1. No acute intracranial abnormality identified.  2. Density within right M2 superior division may represent thrombus.  3. Mild chronic microvascular ischemic changes and volume loss of brain.  4. ASPECTS is 10    Transthoracic Echocardiogram  00/00/00 Pending     PHYSICAL EXAM Blood pressure (!) 170/58, pulse 72, temperature 97.6 F (36.4 C), temperature source Oral, resp. rate 16, weight 70.3 kg, SpO2 97 %. Pleasant elderly male not in distress. . Afebrile. Head is nontraumatic. Neck is supple without bruit.    Cardiac exam no murmur or gallop. Lungs are clear to auscultation. Distal pulses are well felt.  Neurological Exam ;  Awake  Alert  oriented x 3. Normal speech and language.eye movements full without nystagmus.fundi were not visualized. Vision acuity and fields appear normal. Hearing is normal. Palatal movements are normal. Face symmetric. Tongue midline. Normal strength, tone, reflexes and coordination.mild diminished fine finger movements on the left. Orbits right over left upper extremity. Normal sensation. Gait deferred.      ASSESSMENT/PLAN [name] is a 73 y.o. 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 He did not receive IV t-PA due to mild deficits.  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  Resultant  Mild left hand weakness  CT head - Density within right M2 superior division may represent thrombus.   MRI head - Changes are largely ACA distribution, with some MCA and/or ACA-MCA watershed distribution. Few small remote bilateral cerebellar infarcts.   MRA head - not performed  CTA H&N - No emergent large vessel occlusion or high-grade intracranial stenosis.   Carotid Doppler - CTA neck performed - carotid dopplers not indicated.  2D Echo - pending  LDL - 48  HgbA1c - 6.6  UDS - pending  VTE prophylaxis - [name] Heparin  Diet - Heart healthy with thin liquids.  aspirin 325 mg daily prior to admission, now on aspirin 325 mg daily and plavix  Patient counseled to be compliant with his antithrombotic medications  Ongoing aggressive stroke risk factor management  Therapy recommendations:  CIR recommended  Disposition:  Pending  Hypertension  Blood pressure somewhat high at times but within post stroke parameters . Permissive hypertension (OK if < 220/120) but gradually normalize in 5-7 days . Long-term BP goal normotensive  Hyperlipidemia  Lipid lowering medication PTA:  Lipitor 40 mg daily  LDL 48, goal < 70  Current lipid lowering medication: Lipitor 40 mg  daily  Continue statin at discharge  Diabetes  HgbA1c 6.6, goal < 7.0  Controlled  Other Stroke Risk Factors  Advanced age  Former cigarette smoker - quit  Hx stroke/TIA (by imaging)  Coronary artery disease   Other Active Problems  ESRD  May need TEE   Hospital day # 0  Mikey Bussing PA-C [facility] Pager [phone] 02/28/2018, 1:01 PM I have personally obtained history,examined this patient, reviewed notes, independently viewed imaging studies, participated in medical decision making and plan of care.ROS completed by me personally and pertinent positives fully documented  I have made any additions or clarifications directly to the above note. Agree with note above.  He presented with left-sided weakness and MRI show multiple small embolic anterior circulation infarcts likely from a central cardiac source. Strong clinical suspicion for A. Fib but to review of patient's chart including care everywhere does not found  definite documentation of A. Fib yet. Continue cardiac monitoring and echo and he may need TEE and prolonged cardiac monitoring later.continue aspirin   and  Add Plavix for 3 weeks.greater than 50% time during this 35 minute visit was spent on counseling and coordination of care about his embolic strokes and discussion of evaluation and treatment plan and answered questions. Antony Contras, MD Medical Director [facility] Pager: [phone] 02/28/2018 3:12 PM  To contact Stroke Continuity provider, please refer to [email or web address] After hours, contact General Neurology

## 2018-02-28 NOTE — Progress Notes (Signed)
  Echocardiogram 2D Echocardiogram has been performed.  Peter [name] 02/28/2018, 9:46 AM

## 2018-02-28 NOTE — Evaluation (Signed)
Physical Therapy Evaluation Patient Details Name: Peter [name] MRN: [id number] DOB: 01-29-1945 Today's Date: 02/28/2018   History of Present Illness  Pt is a 73 y.o. male admitted 02/27/18 with L-sided weakness while at dialysis. MRI shows small volume diffusion abnormality involving parasagittal cerebral hemispheres, additional L frontal cortical involvement compatible with acute ischemia; remote bilat cerebellar infarcts. PMH includes HTN, ESRD on HD.    Clinical Impression  Pt presents with an overall decrease in functional mobility secondary to above. PTA, pt mod indep and lives alone; daughter present and reports she can assist pt at d/c. Today, pt required min-modA for mobility; limited by generalized weakness, L-side inattention, visual deficits(?) and slowed processing. Pt running into objects on L-side with no awareness. Pt would benefit from intensive CIR-level therapies to maximize functional mobility and independence prior to return home. Will follow acutely to address established goals.    Follow Up Recommendations CIR;Supervision for mobility/OOB    Equipment Recommendations  (TBD)    Recommendations for Other Services       Precautions / Restrictions Precautions Precautions: Fall;Other (comment) Precaution Comments: Inattention to left side (body and environment) Restrictions Weight Bearing Restrictions: No      Mobility  Bed Mobility Overal bed mobility: Needs Assistance Bed Mobility: Supine to Sit     Supine to sit: Min guard;HOB elevated     General bed mobility comments: Min Guard A for safety. Pt overreaching to the right when reaching out to bed rail with RUE.   Transfers Overall transfer level: Needs assistance Equipment used: Rolling walker (2 wheeled) Transfers: Sit to/from Stand Sit to Stand: Mod assist;From elevated surface;+2 safety/equipment         General transfer comment: Mod A to gain balance and prevent falling to R-side; inattention to L  hand, required cues/assist to place on RW  Ambulation/Gait Ambulation/Gait assistance: Min assist Gait Distance (Feet): 15 Feet Assistive device: Rolling walker (2 wheeled) Gait Pattern/deviations: Step-to pattern;Decreased stride length;Trunk flexed Gait velocity: Decreased Gait velocity interpretation: <1.31 ft/sec, indicative of household ambulator General Gait Details: Amb in room with RW and minA to maintain balance; pt not aware he was running into objects on L-side, including the sink. Lateral lean into R-side of walker; difficulty correcting despite cues  Stairs            Wheelchair Mobility    Modified Rankin (Stroke Patients Only) Modified Rankin (Stroke Patients Only) Pre-Morbid Rankin Score: No symptoms Modified Rankin: Moderately severe disability     Balance Overall balance assessment: Needs assistance Sitting-balance support: No upper extremity supported;Feet supported Sitting balance-Leahy Scale: Fair Sitting balance - Comments: During dual tasks, pt leaning right and requiring cues for upright posture Postural control: Right lateral lean Standing balance support: Bilateral upper extremity supported;During functional activity Standing balance-Leahy Scale: Poor Standing balance comment: Reliant on UE support and physical A                             Pertinent Vitals/Pain Pain Assessment: 0-10 Pain Score: 7  Pain Location: Headache Pain Descriptors / Indicators: Headache Pain Intervention(s): Limited activity within patient's tolerance;Monitored during session    Home Living Family/patient expects to be discharged to:: Private residence Living Arrangements: Alone Available Help at Discharge: Family Type of Home: House Home Access: Stairs to enter Entrance Stairs-Rails: Psychiatric nurse of Steps: 3 Home Layout: One level Home Equipment: Shower seat;Grab bars - toilet;Walker - 2 wheels;Cane - single point Additional  Comments:  Daughter lives in [place], reports she could stay with pt a few days if needed    Prior Function Level of Independence: Independent with assistive device(s)         Comments: SPC for household ambulation (holds cane in L hand). RW community ambulation. Drives. Indep with ADLs; assist for some household tasks (i.e. cooking). Indep manage meds     Hand Dominance   Dominant Hand: Right    Extremity/Trunk Assessment   Upper Extremity Assessment Upper Extremity Assessment: LUE deficits/detail LUE Deficits / Details: Decreased attention to LUE. Poor grasp strength and AROM. No note noted during PROM. Pt able to perform shoulder flexion to 80 degrees. Noting overshooting to right.  LUE Coordination: decreased fine motor;decreased gross motor    Lower Extremity Assessment Lower Extremity Assessment: LLE deficits/detail LLE Deficits / Details: Decreased attention to LLE. Grossly 4/5 strength throughout    Cervical / Trunk Assessment Cervical / Trunk Assessment: Other exceptions Cervical / Trunk Exceptions: Forward flexion of shoulders and neck  Communication   Communication: No difficulties  Cognition Arousal/Alertness: Awake/alert Behavior During Therapy: WFL for tasks assessed/performed Overall Cognitive Status: Impaired/Different from baseline Area of Impairment: Attention;Memory;Following commands;Awareness;Problem solving                   Current Attention Level: Selective Memory: Decreased short-term memory Following Commands: Follows one step commands with increased time   Awareness: Emergent Problem Solving: Slow processing;Requires verbal cues General Comments: Pt requiring significant time throughout to process and follow commands. Requiring increased cues to problem solving ADLs with poor awareness of deficits. Pt also becoming tearful during session      General Comments General comments (skin integrity, edema, etc.): Daughter present and  supportive    Exercises Other Exercises Other Exercises: Educated pt and daughter on neurplasticity and benefits of using LUE throughout ADLs   Assessment/Plan    PT Assessment Patient needs continued PT services  PT Problem List Decreased strength;Decreased activity tolerance;Decreased balance;Decreased mobility;Decreased coordination;Decreased cognition;Decreased knowledge of use of DME;Decreased safety awareness       PT Treatment Interventions DME instruction;Gait training;Stair training;Functional mobility training;Therapeutic activities;Therapeutic exercise;Balance training;Patient/family education;Cognitive remediation;Neuromuscular re-education    PT Goals (Current goals can be found in the Care Plan section)  Acute Rehab PT Goals Patient Stated Goal: Return to normal PT Goal Formulation: With patient Time For Goal Achievement: 03/14/18 Potential to Achieve Goals: Good    Frequency Min 4X/week   Barriers to discharge        Co-evaluation PT/OT/SLP Co-Evaluation/Treatment: Yes Reason for Co-Treatment: For patient/therapist safety;To address functional/ADL transfers PT goals addressed during session: Mobility/safety with mobility;Balance OT goals addressed during session: ADL's and self-care       AM-PAC PT "6 Clicks" Mobility  Outcome Measure Help needed turning from your back to your side while in a flat bed without using bedrails?: A Little Help needed moving from lying on your back to sitting on the side of a flat bed without using bedrails?: A Little Help needed moving to and from a bed to a chair (including a wheelchair)?: A Lot Help needed standing up from a chair using your arms (e.g., wheelchair or bedside chair)?: A Lot Help needed to walk in hospital room?: A Little Help needed climbing 3-5 steps with a railing? : A Lot 6 Click Score: 15    End of Session Equipment Utilized During Treatment: Gait belt Activity Tolerance: Patient tolerated treatment  well Patient left: in chair;with call bell/phone within reach;with family/visitor present  Nurse Communication: Mobility status PT Visit Diagnosis: Other abnormalities of gait and mobility (R26.89);Muscle weakness (generalized) (M62.81)    Time: 1010-1035 PT Time Calculation (min) (ACUTE ONLY): 25 min   Charges:   PT Evaluation $PT Eval Moderate Complexity: [place], PT, DPT Acute Rehabilitation Services  Pager [phone] Office [place] 02/28/2018, 2:44 PM

## 2018-02-28 NOTE — Progress Notes (Signed)
Found pt on floor next to recliner. Assisted pt back to bed and vital signs: BP: 128/58, Pulse: 69, SP02: 99%, Temp: 97.5 Pt states feels fine. MD notified.

## 2018-02-28 NOTE — Progress Notes (Signed)
OT Cancellation Note  Patient Details Name: Jaryd Drew MRN: [id number] DOB: 12-16-1945   Cancelled Treatment:    Reason Eval/Treat Not Completed: Patient at procedure or test/ unavailable(Vascular. Will return as schedule allows. Thank you.)  [place], OTR/L Acute Rehab Pager: [phone] Office: [phone] 02/28/2018, 9:17 AM

## 2018-02-28 NOTE — Progress Notes (Signed)
Back on the floor from MRI

## 2018-02-28 NOTE — Progress Notes (Signed)
PT Cancellation Note  Patient Details Name: Henderson Frampton MRN: [id number] DOB: October 08, 1945   Cancelled Treatment:    Reason Eval/Treat Not Completed: Patient at procedure or test/unavailable (vascular). Will follow-up for PT evaluation as schedule permits.  Mabeline Caras, PT, DPT Acute Rehabilitation Services  Pager [phone] Office [place] 02/28/2018, 9:01 AM

## 2018-03-01 DIAGNOSIS — I639 Cerebral infarction, unspecified: Secondary | ICD-10-CM

## 2018-03-01 DIAGNOSIS — I5022 Chronic systolic (congestive) heart failure: Secondary | ICD-10-CM

## 2018-03-01 LAB — HIV ANTIBODY (ROUTINE TESTING W REFLEX): HIV Screen 4th Generation wRfx: NONREACTIVE

## 2018-03-01 MED ORDER — METOPROLOL SUCCINATE ER 100 MG PO TB24
200.0000 mg | ORAL_TABLET | Freq: Every day | ORAL | Status: DC
  Administered 2018-03-01 – 2018-03-29 (×27): 200 mg via ORAL
  Filled 2018-03-01 (×7): qty 2
  Filled 2018-03-01: qty 4
  Filled 2018-03-01 (×22): qty 2

## 2018-03-01 NOTE — Progress Notes (Signed)
PROGRESS NOTE    Peter [name]  IWL:[id number] DOB: 1945-09-03 DOA: 02/27/2018 PCP: Patient, No Pcp Per    Brief Narrative:  73 year old male who presented with weakness.  He does have significant past medical history for end-stage renal disease on hemodialysis and hypertension.  He developed left sided weakness right after finishing dialysis, he had severe left-sided weakness to the point that where he could not get out of the chair.  Physical examination blood pressure was 190/72, heart rate 70, respiratory rate 16, temperature 98.9, oxygen saturation 98% his lungs were clear to auscultation bilaterally, heart S1-S2 present and rhythmic, the abdomen was soft nontender.  Patient was slow to respond to questions, left side weakness 4 out of 5 upper and lower extremities.  Sensation to light touch grossly intact.  Strength on the right side was preserved 5 out of 5.  39, potassium 4.1, chloride 101, bicarb 24, glucose 134, BUN 31, creatinine 8.4, troponin 0.22, 0.26, 0.22, white cell count 7.1, hemoglobin 13.2, hematocrit 41.9, platelets 194.  His head CT had a density within the right M2 superior division possible thrombus.  Patient was admitted to the hospital with a working diagnosis of acute CVA.  Assessment & Plan:   Principal Problem:   Acute CVA (cerebrovascular accident) ([place]) Active Problems:   Elevated troponin   Elevated LFTs   HTN (hypertension)   ESRD on dialysis ([place])   CAD (coronary artery disease)   Diabetes ([facility])   CHF (congestive heart failure) ([place])   Traumatic brain injury ([place])   Anemia   Right kidney mass   TIA (transient ischemic attack)   1. Acute CVA, left anterior cerebral artery distribution with some MCA, watershed distribution, suspected embolic.  Patient continue to have left sided weakness, will continue antiplatelet therapy with full dose aspirin and clopidogrel, continue atorvastatin. Echocardiogram with reduced LV ejection fraction 30 to 35%, with diffuse  hypokinesis, moderate concentric hypertrophy. Case discussed with Dr. Leonie Man from neurology, patient will need TEE and loop recorder before discharge, will consult cardiology for procedures.   2. HTN. Continue blood pressure control with amlodipine, blood pressure systolic 174 mmHg.   3. ESRD. Patient clinically euvolemic, will contact nephrology for inpatient HD, regular schedule M-W-F.   4. Chronic anemia of chronic renal disease. Hb and Hct stable.   5. T2DM. Will continue glucose cover and monitoring with ISS.   6. Systolic heart failure, LV systolic function 30 to 08%, Ischemic cardiomyopathy. Clinically with no signs of decompensation, will resume metoprolol, continue to hold losartan to prevent hypotension.   7. Right renal mass. Will need outpatient follow up.   DVT prophylaxis: heparin   Code Status:  full Family Communication: no family at the bedside  Disposition Plan/ discharge barriers: pending clinical improvement and cardiac workup.   Body mass index is 19.37 kg/m. Malnutrition Type:      Malnutrition Characteristics:      Nutrition Interventions:     RN Pressure Injury Documentation:     Consultants:   Neurology   Procedures:     Antimicrobials:       Subjective: Patient still feeling very weak and deconditioned, his left sided weakness has been improving but not back to baseline, waiting for inpatient rehab. Patient will have HD in am.   Objective: Vitals:   02/28/18 1834 02/28/18 1944 03/01/18 0009 03/01/18 0400  BP: (!) 185/73 (!) 146/50 (!) 185/68 (!) 145/57  Pulse: 73 73 72 79  Resp: 20 18 20 20   Temp: (!) 97.5  F (36.4 C) 98.1 F (36.7 C) 98.5 F (36.9 C) 98.1 F (36.7 C)  TempSrc: Oral Oral Oral Oral  SpO2:  98% 99% 99%  Weight:      Height:  6\' 3"  (1.905 m)      Intake/Output Summary (Last 24 hours) at 03/01/2018 0810 Last data filed at 02/28/2018 1900 Gross per 24 hour  Intake 240 ml  Output -  Net 240 ml   Filed  Weights   02/27/18 1700  Weight: 70.3 kg    Examination:   General: deconditioned  Neurology: Awake and alert, non focal  E ENT: mild pallor, no icterus, oral mucosa moist Cardiovascular: No JVD. S1-S2 present, rhythmic, no gallops, rubs, or murmurs. No lower extremity edema. Pulmonary: positive breath sounds bilaterally, adequate air movement, no wheezing, rhonchi or rales. Gastrointestinal. Abdomen with no organomegaly, non tender, no rebound or guarding Skin. No rashes Musculoskeletal: no joint deformities Left hand grip reduced compared to the right, left leg 4/5 strength.     Data Reviewed: I have personally reviewed following labs and imaging studies  CBC: Recent Labs  Lab 02/27/18 1658  WBC 7.1  NEUTROABS 4.2  HGB 13.2  HCT 41.9  MCV 91.1  PLT 277   Basic Metabolic Panel: Recent Labs  Lab 02/27/18 1658 02/27/18 1703  NA 139  --   K 4.1  --   CL 101  --   CO2 24  --   GLUCOSE 134*  --   BUN 31*  --   CREATININE 8.45* 8.60*  CALCIUM 9.3  --    GFR: Estimated Creatinine Clearance: 7.9 mL/min (A) (by C-G formula based on SCr of 8.6 mg/dL (H)). Liver Function Tests: Recent Labs  Lab 02/27/18 1658  AST 28  ALT 42  ALKPHOS 141*  BILITOT 1.5*  PROT 8.9*  ALBUMIN 3.5   No results for input(s): LIPASE, AMYLASE in the last 168 hours. No results for input(s): AMMONIA in the last 168 hours. Coagulation Profile: Recent Labs  Lab 02/27/18 1658  INR 1.08   Cardiac Enzymes: Recent Labs  Lab 02/27/18 2251 02/28/18 0207 02/28/18 0752  TROPONINI 0.22* 0.26* 0.22*   BNP (last 3 results) No results for input(s): PROBNP in the last 8760 hours. HbA1C: Recent Labs    02/28/18 0207  HGBA1C 6.6*   CBG: No results for input(s): GLUCAP in the last 168 hours. Lipid Profile: Recent Labs    02/28/18 0207  CHOL 106  HDL 36*  LDLCALC 48  TRIG 108  CHOLHDL 2.9   Thyroid Function Tests: No results for input(s): TSH, T4TOTAL, FREET4, T3FREE, THYROIDAB  in the last 72 hours. Anemia Panel: No results for input(s): VITAMINB12, FOLATE, FERRITIN, TIBC, IRON, RETICCTPCT in the last 72 hours.    Radiology Studies: I have reviewed all of the imaging during this hospital visit personally     Scheduled Meds: . amLODipine  5 mg Oral QHS  . aspirin EC  325 mg Oral Daily  . atorvastatin  40 mg Oral q1800  . clopidogrel  75 mg Oral Daily  . famotidine  20 mg Oral QHS  . heparin  5,000 Units Subcutaneous Q8H  . multivitamin  1 tablet Oral Daily   Continuous Infusions:   LOS: 1 day        Hawley Michel Gerome Apley, MD

## 2018-03-01 NOTE — Consult Note (Addendum)
[facility] Renal Consultation Note    Indication for Consultation:  Management of ESRD/hemodialysis; anemia, hypertension/volume and secondary hyperparathyroidism  HPI: Peter [name] is a 73 y.o. male with ESRD on HD MWF, HTN, DM, chronic CHF, CAD, s/p PEA 11/2017.  ESRD secondary to HTN/DM. On PD for 3 years then changed to HD December 2019 during hospitalization with uremia.     Presented to ED with left sided weakness after dialysis Friday and admitted for management of acute CVA. Imaging notable for scattered small volume diffusion abnormality involving the parasagittal cerebral hemispheres bilaterally near the vertex, right greater than left, with additional mild left frontal cortical involvement as above, compatible with acute ischemia. Changes are largely ACA distribution, with some MCA and/or ACA-MCA watershed distribution on brain MRI.   Labs: Na 139, K 4.1, CO2 24, BUN 31 Cr 8.45, Hgb 13.2.  Seen and examined at bedside. Alert and oriented. Has no complaints currently. Remembers Friday's events. Denies fevers, chills, CP, SOB, N/V/D.   Due for routine dialysis on Monday. Dialyzes via [place]. Still has PD catheter. Has some concerns about outpatient dry weight being too low.    Past Medical History:  Diagnosis Date  . Anemia   . CAD (coronary artery disease)   . CHF (congestive heart failure) ([place])   . Diabetes ([place])   . ESRD (end stage renal disease) on dialysis ([place])   . Headache   . HTN (hypertension)   . PEA (Pulseless electrical activity) ([place]) 11/2017  . Traumatic brain injury [facility])    History reviewed. No pertinent surgical history. No family history on file. Social History:  reports that he has quit smoking. He does not have any smokeless tobacco history on file. He reports that he does not drink alcohol or use drugs. Allergies  Allergen Reactions  . Metformin And Related Other (See Comments)    Contraindicated due to renal failure.  Renal failure-on  peritoneal dialysis    . Pioglitazone Other (See Comments)    History of congestive heart failure.    . Propoxyphene Rash   Prior to Admission medications   Medication Sig Start Date End Date Taking? Authorizing Provider  acetaminophen (TYLENOL) 500 MG tablet Take 1,500 mg by mouth daily as needed (pain).    Yes [provider]  amLODipine (NORVASC) 5 MG tablet Take 5 mg by mouth at bedtime. 02/06/18  Yes [provider]  aspirin EC 325 MG tablet Take 325 mg by mouth daily.   Yes [provider]  atorvastatin (LIPITOR) 40 MG tablet Take 40 mg by mouth daily at 6 PM. 12/24/17  Yes [provider]  Calcium Carbonate Antacid (TUMS PO) Take 1-3 mg by mouth See admin instructions. Take 3 tablets by mouth up to three times daily with full meals, take 1 tablet with snacks/small meals   Yes [provider]  famotidine (PEPCID) 20 MG tablet Take 20 mg by mouth at bedtime. 12/22/17  Yes [provider]  furosemide (LASIX) 40 MG tablet Take 40 mg by mouth 2 (two) times daily. 02/17/18  Yes [provider]  isosorbide mononitrate (IMDUR) 60 MG 24 hr tablet Take 60 mg by mouth daily. 12/25/17  Yes [provider]  losartan (COZAAR) 100 MG tablet Take 100 mg by mouth daily. 02/25/18  Yes [provider]  metoprolol (TOPROL-XL) 200 MG 24 hr tablet Take 200 mg by mouth daily. 10/24/17  Yes [provider]  multivitamin (RENA-VIT) TABS tablet Take 1 tablet by mouth daily.  Yes [provider]  nitroGLYCERIN (NITROSTAT) 0.4 MG SL tablet Place 0.4 mg under the tongue every 5 (five) minutes as needed for chest pain.  12/25/17  Yes [provider]  polyvinyl alcohol (ARTIFICIAL TEARS) 1.4 % ophthalmic solution Place 1 drop into both eyes daily as needed for dry eyes.   Yes [provider]   Current Facility-Administered Medications  Medication Dose Route Frequency Provider Last Rate Last Dose  .  acetaminophen (TYLENOL) tablet 650 mg  650 mg Oral Q6H PRN Shela Leff, MD   650 mg at 03/01/18 0204  . amLODipine (NORVASC) tablet 5 mg  5 mg Oral QHS Shela Leff, MD   5 mg at 02/28/18 2148  . aspirin EC tablet 325 mg  325 mg Oral Daily Shela Leff, MD   325 mg at 03/01/18 0924  . atorvastatin (LIPITOR) tablet 40 mg  40 mg Oral q1800 Shela Leff, MD   40 mg at 02/28/18 1628  . clopidogrel (PLAVIX) tablet 75 mg  75 mg Oral Daily Garvin Fila, MD   75 mg at 03/01/18 9485  . famotidine (PEPCID) tablet 20 mg  20 mg Oral QHS Shela Leff, MD   20 mg at 02/28/18 2148  . heparin injection 5,000 Units  5,000 Units Subcutaneous Q8H Shela Leff, MD   5,000 Units at 03/01/18 0542  . metoprolol succinate (TOPROL-XL) 24 hr tablet 200 mg  200 mg Oral Daily Arrien, Jaison Picket, MD      . multivitamin (RENA-VIT) tablet 1 tablet  1 tablet Oral Daily Shela Leff, MD   1 tablet at 03/01/18 [phone]  . polyvinyl alcohol (LIQUIFILM TEARS) 1.4 % ophthalmic solution 1 drop  1 drop Both Eyes Daily PRN Shela Leff, MD      . senna-docusate (Senokot-S) tablet 1 tablet  1 tablet Oral QHS PRN Shela Leff, MD         ROS: As per HPI otherwise negative.  Physical Exam: Vitals:   03/01/18 0009 03/01/18 0400 03/01/18 0810 03/01/18 1130  BP: (!) 185/68 (!) 145/57 (!) 135/47 (!) 162/62  Pulse: 72 79 64 69  Resp: 20 20 18 18   Temp: 98.5 F (36.9 C) 98.1 F (36.7 C) (!) 97.5 F (36.4 C) 98.6 F (37 C)  TempSrc: Oral Oral Axillary Oral  SpO2: 99% 99% 100% 96%  Weight:      Height:         General: WDWN male NAD  Head: NCAT sclera not icteric MMM Neck: Supple. Unable to assess JVD with beard  Lungs: CTA bilaterally without wheezes, rales, or rhonchi. Breathing is unlabored. Heart: RRR with S1 S2 Abdomen: soft NT + BS Lower extremities:without edema or ischemic changes, no open wounds  Neuro: A & O  X 3. Moves all extremities spontaneously. Psych:   Responds to questions appropriately with a normal affect. Dialysis Access: R IJ [facility]   Labs: Basic Metabolic Panel: Recent Labs  Lab 02/27/18 1658 02/27/18 1703  NA 139  --   K 4.1  --   CL 101  --   CO2 24  --   GLUCOSE 134*  --   BUN 31*  --   CREATININE 8.45* 8.60*  CALCIUM 9.3  --    Liver Function Tests: Recent Labs  Lab 02/27/18 1658  AST 28  ALT 42  ALKPHOS 141*  BILITOT 1.5*  PROT 8.9*  ALBUMIN 3.5   No results for input(s): LIPASE, AMYLASE in the last 168 hours. No results for input(s): AMMONIA  in the last 168 hours. CBC: Recent Labs  Lab 02/27/18 1658  WBC 7.1  NEUTROABS 4.2  HGB 13.2  HCT 41.9  MCV 91.1  PLT 194   Cardiac Enzymes: Recent Labs  Lab 02/27/18 2251 02/28/18 0207 02/28/18 0752  TROPONINI 0.22* 0.26* 0.22*   CBG: No results for input(s): GLUCAP in the last 168 hours. Iron Studies: No results for input(s): IRON, TIBC, TRANSFERRIN, FERRITIN in the last 72 hours. Studies/Results: Ct Angio Head W Or Wo Contrast  Result Date: 02/27/2018 CLINICAL DATA:  Sudden onset left-sided weakness and altered mental status following dialysis. EXAM: CT ANGIOGRAPHY HEAD AND NECK TECHNIQUE: Multidetector CT imaging of the head and neck was performed using the standard protocol during bolus administration of intravenous contrast. Multiplanar CT image reconstructions and MIPs were obtained to evaluate the vascular anatomy. Carotid stenosis measurements (when applicable) are obtained utilizing NASCET criteria, using the distal internal carotid diameter as the denominator. CONTRAST:  119mL OMNIPAQUE IOHEXOL 300 MG/ML  SOLN COMPARISON:  Head CT 02/27/2018 FINDINGS: CTA NECK FINDINGS SKELETON: There is no bony spinal canal stenosis. No lytic or blastic lesion. OTHER NECK: Normal pharynx, larynx and major salivary glands. No cervical lymphadenopathy. Unremarkable thyroid gland. UPPER CHEST: Mild biapical emphysema AORTIC ARCH: There is moderate calcific  atherosclerosis of the aortic arch. There is no aneurysm, dissection or hemodynamically significant stenosis of the visualized ascending aorta and aortic arch. Normal variant aortic arch branching pattern with the left vertebral artery arising independently from the aortic arch. The visualized proximal subclavian arteries are widely patent. RIGHT CAROTID SYSTEM: --Common carotid artery: Widely patent origin without common carotid artery dissection or aneurysm. --Internal carotid artery: No dissection, occlusion or aneurysm. Mild atherosclerotic calcification at the carotid bifurcation without hemodynamically significant stenosis. --External carotid artery: No acute abnormality. LEFT CAROTID SYSTEM: --Common carotid artery: Widely patent origin without common carotid artery dissection or aneurysm. --Internal carotid artery: No dissection, occlusion or aneurysm. There is predominantly noncalcified atherosclerosis extending into the proximal ICA, resulting in 50% stenosis. Small area of likely ulcerative plaque within the distal left ICA. --External carotid artery: No acute abnormality. VERTEBRAL ARTERIES: Codominant configuration. At least moderate stenosis of the right vertebral artery origin. The remainder of the right vertebral artery is normal. Normal left vertebral artery. CTA HEAD FINDINGS POSTERIOR CIRCULATION: --Basilar artery: Normal. --Posterior cerebral arteries: Both posterior cerebral arteries are partially supplied by the posterior communicating arteries. --Superior cerebellar arteries: Normal. --Inferior cerebellar arteries: Normal anterior and posterior inferior cerebellar arteries. ANTERIOR CIRCULATION: --Intracranial internal carotid arteries: Atherosclerotic calcification of the internal carotid arteries at the skull base without hemodynamically significant stenosis. --Anterior cerebral arteries: Normal. Both A1 segments are present. Patent anterior communicating artery. --Middle cerebral arteries:  Normal. --Posterior communicating arteries: Present bilaterally. VENOUS SINUSES: As permitted by contrast timing, patent. ANATOMIC VARIANTS: None DELAYED PHASE: No parenchymal contrast enhancement. Review of the MIP images confirms the above findings. IMPRESSION: 1. No emergent large vessel occlusion or high-grade intracranial stenosis. 2. Approximately 50% stenosis of the proximal left internal carotid artery. 3. At least moderate stenosis of the right vertebral artery origin. Aortic Atherosclerosis (ICD10-I70.0) and Emphysema (ICD10-J43.9). Electronically Signed   By: Ulyses Jarred M.D.   On: 02/27/2018 18:04   Ct Angio Neck W Or Wo Contrast  Result Date: 02/27/2018 CLINICAL DATA:  Sudden onset left-sided weakness and altered mental status following dialysis. EXAM: CT ANGIOGRAPHY HEAD AND NECK TECHNIQUE: Multidetector CT imaging of the head and neck was performed using the standard protocol during bolus administration of  intravenous contrast. Multiplanar CT image reconstructions and MIPs were obtained to evaluate the vascular anatomy. Carotid stenosis measurements (when applicable) are obtained utilizing NASCET criteria, using the distal internal carotid diameter as the denominator. CONTRAST:  136mL OMNIPAQUE IOHEXOL 300 MG/ML  SOLN COMPARISON:  Head CT 02/27/2018 FINDINGS: CTA NECK FINDINGS SKELETON: There is no bony spinal canal stenosis. No lytic or blastic lesion. OTHER NECK: Normal pharynx, larynx and major salivary glands. No cervical lymphadenopathy. Unremarkable thyroid gland. UPPER CHEST: Mild biapical emphysema AORTIC ARCH: There is moderate calcific atherosclerosis of the aortic arch. There is no aneurysm, dissection or hemodynamically significant stenosis of the visualized ascending aorta and aortic arch. Normal variant aortic arch branching pattern with the left vertebral artery arising independently from the aortic arch. The visualized proximal subclavian arteries are widely patent. RIGHT CAROTID  SYSTEM: --Common carotid artery: Widely patent origin without common carotid artery dissection or aneurysm. --Internal carotid artery: No dissection, occlusion or aneurysm. Mild atherosclerotic calcification at the carotid bifurcation without hemodynamically significant stenosis. --External carotid artery: No acute abnormality. LEFT CAROTID SYSTEM: --Common carotid artery: Widely patent origin without common carotid artery dissection or aneurysm. --Internal carotid artery: No dissection, occlusion or aneurysm. There is predominantly noncalcified atherosclerosis extending into the proximal ICA, resulting in 50% stenosis. Small area of likely ulcerative plaque within the distal left ICA. --External carotid artery: No acute abnormality. VERTEBRAL ARTERIES: Codominant configuration. At least moderate stenosis of the right vertebral artery origin. The remainder of the right vertebral artery is normal. Normal left vertebral artery. CTA HEAD FINDINGS POSTERIOR CIRCULATION: --Basilar artery: Normal. --Posterior cerebral arteries: Both posterior cerebral arteries are partially supplied by the posterior communicating arteries. --Superior cerebellar arteries: Normal. --Inferior cerebellar arteries: Normal anterior and posterior inferior cerebellar arteries. ANTERIOR CIRCULATION: --Intracranial internal carotid arteries: Atherosclerotic calcification of the internal carotid arteries at the skull base without hemodynamically significant stenosis. --Anterior cerebral arteries: Normal. Both A1 segments are present. Patent anterior communicating artery. --Middle cerebral arteries: Normal. --Posterior communicating arteries: Present bilaterally. VENOUS SINUSES: As permitted by contrast timing, patent. ANATOMIC VARIANTS: None DELAYED PHASE: No parenchymal contrast enhancement. Review of the MIP images confirms the above findings. IMPRESSION: 1. No emergent large vessel occlusion or high-grade intracranial stenosis. 2. Approximately  50% stenosis of the proximal left internal carotid artery. 3. At least moderate stenosis of the right vertebral artery origin. Aortic Atherosclerosis (ICD10-I70.0) and Emphysema (ICD10-J43.9). Electronically Signed   By: Ulyses Jarred M.D.   On: 02/27/2018 18:04   [name]  Result Date: 02/28/2018 CLINICAL DATA:  Initial evaluation for acute left-sided weakness. EXAM: MRI HEAD WITHOUT CONTRAST TECHNIQUE: Multiplanar, multiecho pulse sequences of the brain and surrounding structures were obtained without intravenous contrast. COMPARISON:  Prior CT and CTA from earlier same day. FINDINGS: Brain: Generalized age-related cerebral atrophy. Mild T2/FLAIR hyperintensity within the periventricular and deep white matter both cerebral hemispheres, nonspecific, but most like related chronic microvascular ischemic disease, minimal for age. Few scattered small remote bilateral cerebellar infarcts noted. Subtle scattered diffusion abnormality seen involving the parasagittal cortical gray matter of the high right frontoparietal region (series 5, images 87, 84, 83). Additional subtle diffusion abnormality seen involving the parasagittal left frontal parietal region, left cingulate (series 5, image 81). Small focus of diffusion abnormality at the anterior left frontal lobe (series 5, image 80). Finding compatible with small acute ischemic infarcts, ACA and/or ACA/MCA distributions. No associated hemorrhage or mass effect. Gray-white matter differentiation otherwise maintained. No other evidence for remote cortical infarction. No foci of susceptibility  artifact to suggest acute or chronic intracranial hemorrhage. No mass lesion, midline shift or mass effect. No hydrocephalus. No extra-axial fluid collection. Pituitary gland within normal limits. Vascular: Major intracranial vascular flow voids maintained at the skull base. Diminutive vertebrobasilar system noted. Skull and upper cervical spine: Craniocervical junction  normal. Upper cervical spine within normal limits. Bone marrow signal intensity normal. No scalp soft tissue abnormality. Sinuses/Orbits: Globes and orbital soft tissues within normal limits. Mild scattered mucosal thickening within the ethmoidal air cells. Paranasal sinuses are otherwise largely clear. Trace right mastoid effusion, of doubtful significance. Inner ear structures grossly normal. Other: None. IMPRESSION: 1. Scattered small volume diffusion abnormality involving the parasagittal cerebral hemispheres bilaterally near the vertex, right greater than left, with additional mild left frontal cortical involvement as above, compatible with acute ischemia. Changes are largely ACA distribution, with some MCA and/or ACA-MCA watershed distribution. 2. Few small remote bilateral cerebellar infarcts. 3. Underlying age-related cerebral atrophy with mild chronic microvascular ischemic disease. Electronically Signed   By: Jeannine Boga M.D.   On: 02/28/2018 01:02   Ct Head Code Stroke Wo Contrast  Result Date: 02/27/2018 CLINICAL DATA:  Code stroke.  73 y/o  M; left-sided weakness. EXAM: CT HEAD WITHOUT CONTRAST TECHNIQUE: Contiguous axial images were obtained from the base of the skull through the vertex without intravenous contrast. COMPARISON:  None. FINDINGS: Brain: No evidence of acute infarction, hemorrhage, hydrocephalus, extra-axial collection or mass lesion/mass effect. Nonspecific white matter hypodensities compatible with chronic microvascular ischemic changes volume loss of the brain. Vascular: Density within right M2 superior division may represent thrombus. Skull: Normal. Negative for fracture or focal lesion. Sinuses/Orbits: No acute finding. Other: None. ASPECTS [facility] Stroke Program Early CT Score) - Ganglionic level infarction (caudate, lentiform nuclei, internal capsule, insula, M1-M3 cortex): 7 - Supraganglionic infarction (M4-M6 cortex): 3 Total score (0-10 with 10 being normal): 10  IMPRESSION: 1. No acute intracranial abnormality identified. 2. Density within right M2 superior division may represent thrombus. 3. Mild chronic microvascular ischemic changes and volume loss of brain. 4. ASPECTS is 10 These results were called by telephone at the time of interpretation on 02/27/2018 at 4:48 pm to Dr. Lorraine Lax, who verbally acknowledged these results. Electronically Signed   By: Kristine Garbe M.D.   On: 02/27/2018 16:49   US Abdomen Limited Ruq  Result Date: 02/28/2018 CLINICAL DATA:  69-year-old with elevated liver function studies. EXAM: ULTRASOUND ABDOMEN LIMITED RIGHT UPPER QUADRANT COMPARISON:  None. FINDINGS: Gallbladder: The gallbladder is mildly distended. There is a 3 mm gallbladder polyp. No gallstones, gallbladder wall thickening or sonographic Murphy's sign. Common bile duct: Diameter: 4 mm Liver: No focal lesion identified. Within normal limits in parenchymal echogenicity. Portal vein is patent on color Doppler imaging with normal direction of blood flow towards the liver. Incidental imaging of the right kidney demonstrates a small cyst in the lower pole measuring 8 mm maximally as well as a complex adjacent echogenic lesion measuring 12 x 10 x 10 mm. This demonstrates no gross blood flow on this limited examination. Echogenic shadowing foci are present in the mid right kidney consistent with calculi. IMPRESSION: 1. No evidence of gallstones, biliary dilatation or cholecystitis. Tiny gallbladder polyp. 2. The liver appears unremarkable. 3. Indeterminate small mass in the lower pole of the right kidney. This could reflect a complex cyst or solid lesion. Further evaluation recommended. Given the patient's renal insufficiency, noncontrast renal MRI is likely the best available imaging option. Electronically Signed   By: Caryl Comes.D.  On: 02/28/2018 09:01    Dialysis Orders:  [facility] (will need to call in am for current orders) 3.5h  R IJ TDC  Patient reported dry weight 155 lbs   Assessment/Plan: 1. Acute CVA w L sided weakness. Multiple infarcts ACA some MCA distrubution. Per neuro/primary   2. ESRD -  HD MWF. HD tomorrow on schedule.  3. Hypertension/volume  - BP elevated. On 5 agents at home. Permissible HTN per neuro. Keep SBP >100 on HD. Appears euvolemic. Is at self reported dry weight.  4. Anemia  - Hgb >13. No ESA needs currently  5.  Metabolic bone disease -  Ca ok. Tums binder. Check Phos here.  6. CM/CHF - reduced EF 30-35%   Lynnda Child PA-C [place] Pager [phone] 03/01/2018, 2:50 PM   Pt seen, examined and agree w A/P as above.  [facility] 03/01/2018, 6:00 PM

## 2018-03-01 NOTE — Progress Notes (Signed)
STROKE TEAM PROGRESS NOTE       SUBJECTIVE (INTERVAL HISTORY) His daughter is at the bedside The patient believes he had afib in the past but we are unable to find documentation; although, his daughter is not sure.   OBJECTIVE Vitals:   03/01/18 0009 03/01/18 0400 03/01/18 0810 03/01/18 1130  BP: (!) 185/68 (!) 145/57 (!) 135/47 (!) 162/62  Pulse: 72 79 64 69  Resp: 20 20 18 18   Temp: 98.5 F (36.9 C) 98.1 F (36.7 C) (!) 97.5 F (36.4 C) 98.6 F (37 C)  TempSrc: Oral Oral Axillary Oral  SpO2: 99% 99% 100% 96%  Weight:      Height:        CBC:  Recent Labs  Lab 02/27/18 1658  WBC 7.1  NEUTROABS 4.2  HGB 13.2  HCT 41.9  MCV 91.1  PLT 831    Basic Metabolic Panel:  Recent Labs  Lab 02/27/18 1658 02/27/18 1703  NA 139  --   K 4.1  --   CL 101  --   CO2 24  --   GLUCOSE 134*  --   BUN 31*  --   CREATININE 8.45* 8.60*  CALCIUM 9.3  --     Lipid Panel:     Component Value Date/Time   CHOL 106 02/28/2018 0207   TRIG 108 02/28/2018 0207   HDL 36 (L) 02/28/2018 0207   CHOLHDL 2.9 02/28/2018 0207   VLDL 22 02/28/2018 0207   LDLCALC 48 02/28/2018 0207   HgbA1c:  Lab Results  Component Value Date   HGBA1C 6.6 (H) 02/28/2018   Urine Drug Screen: No results found for: LABOPIA, COCAINSCRNUR, LABBENZ, AMPHETMU, THCU, LABBARB  Alcohol Level     Component Value Date/Time   ETH <10 02/27/2018 1658    IMAGING  Ct Angio Head W Or Wo Contrast Ct Angio Neck W Or Wo Contrast 02/27/2018 IMPRESSION:  1. No emergent large vessel occlusion or high-grade intracranial stenosis.  2. Approximately 50% stenosis of the proximal left internal carotid artery.  3. At least moderate stenosis of the right vertebral artery origin.   Aortic Atherosclerosis (ICD10-I70.0) and Emphysema (ICD10-J43.9).    Peter Brain Wo Contrast 02/28/2018 IMPRESSION:  1. Scattered small volume diffusion abnormality involving the parasagittal cerebral hemispheres bilaterally near the vertex,  right greater than left, with additional mild left frontal cortical involvement as above, compatible with acute ischemia. Changes are largely ACA distribution, with some MCA and/or ACA-MCA watershed distribution.  2. Few small remote bilateral cerebellar infarcts.   3. Underlying age-related cerebral atrophy with mild chronic microvascular ischemic disease.    Ct Head Code Stroke Wo Contrast 02/27/2018 IMPRESSION:  1. No acute intracranial abnormality identified.  2. Density within right M2 superior division may represent thrombus.  3. Mild chronic microvascular ischemic changes and volume loss of brain.  4. ASPECTS is 10     TTE :  Moderate to severe reduced systolic function with ejection fraction 30-35%. Moderate concentric left ventricle hypertrophy. Diffuse hypokinesis. No definite clot noted.     PHYSICAL EXAM Blood pressure (!) 162/62, pulse 69, temperature 98.6 F (37 C), temperature source Oral, resp. rate 18, height 6\' 3"  (1.905 m), weight 70.3 kg, SpO2 96 %. Pleasant elderly male not in distress. . Afebrile. Head is nontraumatic. Neck is supple without bruit.    Cardiac exam no murmur or gallop. Lungs are clear to auscultation. Distal pulses are well felt.  Neurological Exam ;  Awake  Alert oriented x 3.  Normal speech and language.eye movements full without nystagmus.fundi were not visualized. Vision acuity and fields appear normal. Hearing is normal. Palatal movements are normal. Face symmetric. Tongue midline. Normal strength, tone, reflexes and coordination.mild diminished fine finger movements on the left. Orbits right over left upper extremity. Normal sensation. Gait deferred.      ASSESSMENT/PLAN Peter. Peter [name] is a 73 y.o. 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 He did not receive IV t-PA due to mild deficits.  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  Resultant  Mild left hand weakness  CT head - Density within right M2 superior division may represent thrombus.   MRI head - Changes are largely ACA distribution, with some MCA and/or ACA-MCA watershed distribution. Few small remote bilateral cerebellar infarcts.   MRA head - not performed  CTA H&N - No emergent large vessel occlusion or high-grade intracranial stenosis.   Carotid Doppler - CTA neck performed - carotid dopplers not indicated.  2D Echo - diminished ejection fraction 30-35% with diffuse hypokinesis  LDL - 48  HgbA1c - 6.6  UDS - pending  VTE prophylaxis - [place] Heparin  Diet - Heart healthy with thin liquids.  aspirin 325 mg daily prior to admission, now on aspirin 325 mg daily and plavix  Patient counseled to be compliant with his antithrombotic medications  Ongoing aggressive stroke risk factor management  Therapy recommendations:  CIR recommended  Disposition:  Pending  Hypertension  Blood pressure somewhat high at times but within post stroke parameters . Permissive hypertension (OK if < 220/120) but gradually normalize in 5-7 days . Long-term BP goal normotensive  Hyperlipidemia  Lipid lowering medication PTA:  Lipitor 40 mg daily  LDL 48, goal < 70  Current lipid lowering medication: Lipitor 40 mg daily  Continue statin at discharge  Diabetes  HgbA1c 6.6, goal < 7.0  Controlled  Other Stroke Risk Factors  Advanced age  Former cigarette smoker - quit  Hx stroke/TIA (by imaging)  Coronary artery disease   Other Active Problems  ESRD  May need TEE   Hospital day # 1   .  He presented with left-sided weakness and MRI show multiple small embolic anterior circulation infarcts likely from a central cardiac source. Strong clinical suspicion for A. Fib but  review of patient's chart including care everywhere does not found definite documentation of A. Fib yet. Continue cardiac monitoring and will need TEE and prolonged  cardiac monitoring . Ccontinue aspirin   and  add Plavix for 3 weeks.D/W Dr Riccardo Dubin.Greater than 50% time during this 25 minute visit was spent on counseling and coordination of care about his embolic strokes and discussion of evaluation and treatment plan and answered questions. Antony Contras, MD Medical Director [facility] Pager: [phone] 03/01/2018 1:32 PM  To contact Stroke Continuity provider, please refer to [email or web address] After hours, contact General Neurology

## 2018-03-01 NOTE — Consult Note (Signed)
Physical Medicine and Rehabilitation Consult Reason for Consult: Left-sided weakness Referring Physician: Arrien   HPI: Peter [name] is a 73 y.o. male with a history of end-stage renal disease, diabetes and hypertension who presented on 02/27/2018 with confusion and left-sided weakness after completing dialysis.  CT of the head revealed a density within the right M2 superior division.  CT Angie O of the head neck revealed no large vessel occlusion or high-grade intracranial stenosis.  There was 50% stenosis of the left internal carotid artery and least moderate stenosis of the right vertebral artery origin.  MRI of the brain on 02/28/2018 revealed scattered small volume diffusion abnormality involving the parasagittal cerebral hemispheres bilaterally near the vertex, right greater than left.  Areas of ischemia felt to be largely in the [place] distribution with some MCA versus ACA-MCA watershed distribution.  Patient was seen by therapies and demonstrated difficulties with mobility and self-care.  Physical medicine rehab was asked to see the patient to recommend further rehab needs.   Review of Systems  Constitutional: Negative for fever.  HENT: Negative for tinnitus.   Eyes: Negative for blurred vision.  Respiratory: Negative for cough.   Cardiovascular: Negative for palpitations.  Gastrointestinal: Negative for nausea and vomiting.  Genitourinary: Negative for dysuria.  Musculoskeletal: Negative for myalgias.  Skin: Negative for rash.  Neurological: Positive for speech change and focal weakness. Negative for headaches.  Psychiatric/Behavioral: Negative for depression.   Past Medical History:  Diagnosis Date  . Anemia   . CAD (coronary artery disease)   . CHF (congestive heart failure) ([place])   . Diabetes ([place])   . ESRD (end stage renal disease) on dialysis ([place])   . Headache   . HTN (hypertension)   . PEA (Pulseless electrical activity) ([place]) 11/2017  . Traumatic brain injury [facility])     History reviewed. No pertinent surgical history. No family history on file. Social History:  reports that he has quit smoking. He does not have any smokeless tobacco history on file. He reports that he does not drink alcohol or use drugs. Allergies:  Allergies  Allergen Reactions  . Metformin And Related Other (See Comments)    Contraindicated due to renal failure.  Renal failure-on peritoneal dialysis    . Pioglitazone Other (See Comments)    History of congestive heart failure.    . Propoxyphene Rash   Medications Prior to Admission  Medication Sig Dispense Refill  . acetaminophen (TYLENOL) 500 MG tablet Take 1,500 mg by mouth daily as needed (pain).     Marland Kitchen amLODipine (NORVASC) 5 MG tablet Take 5 mg by mouth at bedtime.    Marland Kitchen aspirin EC 325 MG tablet Take 325 mg by mouth daily.    Marland Kitchen atorvastatin (LIPITOR) 40 MG tablet Take 40 mg by mouth daily at 6 PM.    . Calcium Carbonate Antacid (TUMS PO) Take 1-3 mg by mouth See admin instructions. Take 3 tablets by mouth up to three times daily with full meals, take 1 tablet with snacks/small meals    . famotidine (PEPCID) 20 MG tablet Take 20 mg by mouth at bedtime.    . furosemide (LASIX) 40 MG tablet Take 40 mg by mouth 2 (two) times daily.    . isosorbide mononitrate (IMDUR) 60 MG 24 hr tablet Take 60 mg by mouth daily.    Marland Kitchen losartan (COZAAR) 100 MG tablet Take 100 mg by mouth daily.    . metoprolol (TOPROL-XL) 200 MG 24 hr tablet Take 200 mg  by mouth daily.    . multivitamin (RENA-VIT) TABS tablet Take 1 tablet by mouth daily.    . nitroGLYCERIN (NITROSTAT) 0.4 MG SL tablet Place 0.4 mg under the tongue every 5 (five) minutes as needed for chest pain.     . polyvinyl alcohol (ARTIFICIAL TEARS) 1.4 % ophthalmic solution Place 1 drop into both eyes daily as needed for dry eyes.      Home: Home Living Family/patient expects to be discharged to:: Private residence Living Arrangements: Alone Available Help at Discharge: Family Type of  Home: House Home Access: Stairs to enter Technical brewer of Steps: 3 Entrance Stairs-Rails: Right, Left Home Layout: One level Bathroom Shower/Tub: Chiropodist: Handicapped height Home Equipment: Civil engineer, contracting, Grab bars - toilet, Environmental consultant - 2 wheels, Cane - single point Additional Comments: Daughter lives in [place], reports she could stay with pt a few days if needed  Functional History: Prior Function Level of Independence: Independent with assistive device(s) Comments: SPC for household ambulation (holds cane in L hand). RW community ambulation. Drives. Indep with ADLs; assist for some household tasks (i.e. cooking). Indep manage meds Functional Status:  Mobility: Bed Mobility Overal bed mobility: Needs Assistance Bed Mobility: Supine to Sit Supine to sit: Min guard, HOB elevated General bed mobility comments: Min Guard A for safety. Pt overreaching to the right when reaching out to bed rail with RUE.  Transfers Overall transfer level: Needs assistance Equipment used: Rolling walker (2 wheeled) Transfers: Sit to/from Stand Sit to Stand: Mod assist, From elevated surface, +2 safety/equipment General transfer comment: Mod A to gain balance and prevent falling to R-side; inattention to L hand, required cues/assist to place on RW Ambulation/Gait Ambulation/Gait assistance: Min assist Gait Distance (Feet): 15 Feet Assistive device: Rolling walker (2 wheeled) Gait Pattern/deviations: Step-to pattern, Decreased stride length, Trunk flexed General Gait Details: Amb in room with RW and minA to maintain balance; pt not aware he was running into objects on L-side, including the sink. Lateral lean into R-side of walker; difficulty correcting despite cues Gait velocity: Decreased Gait velocity interpretation: <1.31 ft/sec, indicative of household ambulator    ADL: ADL Overall ADL's : Needs assistance/impaired Eating/Feeding: Minimal assistance,  Sitting Eating/Feeding Details (indicate cue type and reason): Min A for bilateral FM tasks Grooming: Minimal assistance, Sitting Grooming Details (indicate cue type and reason): Min A for bilateral tasks with poor functional use of LUE Upper Body Bathing: Minimal assistance, Sitting Lower Body Bathing: Moderate assistance, Sit to/from stand Upper Body Dressing : Minimal assistance, Sitting Upper Body Dressing Details (indicate cue type and reason): donned left arm first and required Max cues for targeted reach through sleeve. Pt overshooting LUE to the right of sleeve hole.  Lower Body Dressing: Moderate assistance, Sit to/from stand Toilet Transfer: Minimal assistance, Ambulation, RW(simulated to recliner) Functional mobility during ADLs: Minimal assistance, Rolling walker(Max cues for RW management with inattention to left side) General ADL Comments: Pt presenting with decreased strength, balance, functional use of LUE, cognition (with slow processing), and vision deficits.   Cognition: Cognition Overall Cognitive Status: Impaired/Different from baseline Orientation Level: Oriented to person, Oriented to place, Oriented to time, Disoriented to situation Cognition Arousal/Alertness: Awake/alert Behavior During Therapy: WFL for tasks assessed/performed Overall Cognitive Status: Impaired/Different from baseline Area of Impairment: Attention, Memory, Following commands, Awareness, Problem solving Current Attention Level: Selective Memory: Decreased short-term memory Following Commands: Follows one step commands with increased time Awareness: Emergent Problem Solving: Slow processing, Requires verbal cues General Comments: Pt requiring significant  time throughout to process and follow commands. Requiring increased cues to problem solving ADLs with poor awareness of deficits. Pt also becoming tearful during session  Blood pressure (!) 162/62, pulse 69, temperature 98.6 F (37 C),  temperature source Oral, resp. rate 18, height 6\' 3"  (1.905 m), weight 70.3 kg, SpO2 96 %. Physical Exam  Constitutional: No distress.  HENT:  Head: Normocephalic and atraumatic.  Eyes: Pupils are equal, round, and reactive to light.  Neck: Normal range of motion.  Cardiovascular: Normal rate.  Respiratory: Effort normal.  GI: Soft.  Musculoskeletal:        General: No edema.  Neurological: He is alert. No cranial nerve deficit.  Patient demonstrates reasonable insight and awareness.  Speech is slightly slurred.  Mild left facial weakness.  Right upper extremity and right lower extremity grossly 4+ to 5 out of 5.  Left upper extremity 4 out of 5 proximal to the distal with decreased fine motor coordination and pronator drift.  Left lower extremity 2+ out of 5 hip flexion knee extension and 4 out of 5 ankle dorsiflexion and plantarflexion.  Patient was initiating use of left upper extremity for feeding and other tasks while I was in the room.  Skin: Skin is warm. He is not diaphoretic.  Psychiatric:  Patient was a bit flat but generally appropriate otherwise    No results found for this or any previous visit (from the past 24 hour(s)). Ct Angio Head W Or Wo Contrast  Result Date: 02/27/2018 CLINICAL DATA:  Sudden onset left-sided weakness and altered mental status following dialysis. EXAM: CT ANGIOGRAPHY HEAD AND NECK TECHNIQUE: Multidetector CT imaging of the head and neck was performed using the standard protocol during bolus administration of intravenous contrast. Multiplanar CT image reconstructions and MIPs were obtained to evaluate the vascular anatomy. Carotid stenosis measurements (when applicable) are obtained utilizing NASCET criteria, using the distal internal carotid diameter as the denominator. CONTRAST:  175mL OMNIPAQUE IOHEXOL 300 MG/ML  SOLN COMPARISON:  Head CT 02/27/2018 FINDINGS: CTA NECK FINDINGS SKELETON: There is no bony spinal canal stenosis. No lytic or blastic lesion.  OTHER NECK: Normal pharynx, larynx and major salivary glands. No cervical lymphadenopathy. Unremarkable thyroid gland. UPPER CHEST: Mild biapical emphysema AORTIC ARCH: There is moderate calcific atherosclerosis of the aortic arch. There is no aneurysm, dissection or hemodynamically significant stenosis of the visualized ascending aorta and aortic arch. Normal variant aortic arch branching pattern with the left vertebral artery arising independently from the aortic arch. The visualized proximal subclavian arteries are widely patent. RIGHT CAROTID SYSTEM: --Common carotid artery: Widely patent origin without common carotid artery dissection or aneurysm. --Internal carotid artery: No dissection, occlusion or aneurysm. Mild atherosclerotic calcification at the carotid bifurcation without hemodynamically significant stenosis. --External carotid artery: No acute abnormality. LEFT CAROTID SYSTEM: --Common carotid artery: Widely patent origin without common carotid artery dissection or aneurysm. --Internal carotid artery: No dissection, occlusion or aneurysm. There is predominantly noncalcified atherosclerosis extending into the proximal ICA, resulting in 50% stenosis. Small area of likely ulcerative plaque within the distal left ICA. --External carotid artery: No acute abnormality. VERTEBRAL ARTERIES: Codominant configuration. At least moderate stenosis of the right vertebral artery origin. The remainder of the right vertebral artery is normal. Normal left vertebral artery. CTA HEAD FINDINGS POSTERIOR CIRCULATION: --Basilar artery: Normal. --Posterior cerebral arteries: Both posterior cerebral arteries are partially supplied by the posterior communicating arteries. --Superior cerebellar arteries: Normal. --Inferior cerebellar arteries: Normal anterior and posterior inferior cerebellar arteries. ANTERIOR CIRCULATION: --Intracranial internal carotid  arteries: Atherosclerotic calcification of the internal carotid arteries at  the skull base without hemodynamically significant stenosis. --Anterior cerebral arteries: Normal. Both A1 segments are present. Patent anterior communicating artery. --Middle cerebral arteries: Normal. --Posterior communicating arteries: Present bilaterally. VENOUS SINUSES: As permitted by contrast timing, patent. ANATOMIC VARIANTS: None DELAYED PHASE: No parenchymal contrast enhancement. Review of the MIP images confirms the above findings. IMPRESSION: 1. No emergent large vessel occlusion or high-grade intracranial stenosis. 2. Approximately 50% stenosis of the proximal left internal carotid artery. 3. At least moderate stenosis of the right vertebral artery origin. Aortic Atherosclerosis (ICD10-I70.0) and Emphysema (ICD10-J43.9). Electronically Signed   By: Ulyses Jarred M.D.   On: 02/27/2018 18:04   Ct Angio Neck W Or Wo Contrast  Result Date: 02/27/2018 CLINICAL DATA:  Sudden onset left-sided weakness and altered mental status following dialysis. EXAM: CT ANGIOGRAPHY HEAD AND NECK TECHNIQUE: Multidetector CT imaging of the head and neck was performed using the standard protocol during bolus administration of intravenous contrast. Multiplanar CT image reconstructions and MIPs were obtained to evaluate the vascular anatomy. Carotid stenosis measurements (when applicable) are obtained utilizing NASCET criteria, using the distal internal carotid diameter as the denominator. CONTRAST:  186mL OMNIPAQUE IOHEXOL 300 MG/ML  SOLN COMPARISON:  Head CT 02/27/2018 FINDINGS: CTA NECK FINDINGS SKELETON: There is no bony spinal canal stenosis. No lytic or blastic lesion. OTHER NECK: Normal pharynx, larynx and major salivary glands. No cervical lymphadenopathy. Unremarkable thyroid gland. UPPER CHEST: Mild biapical emphysema AORTIC ARCH: There is moderate calcific atherosclerosis of the aortic arch. There is no aneurysm, dissection or hemodynamically significant stenosis of the visualized ascending aorta and aortic arch.  Normal variant aortic arch branching pattern with the left vertebral artery arising independently from the aortic arch. The visualized proximal subclavian arteries are widely patent. RIGHT CAROTID SYSTEM: --Common carotid artery: Widely patent origin without common carotid artery dissection or aneurysm. --Internal carotid artery: No dissection, occlusion or aneurysm. Mild atherosclerotic calcification at the carotid bifurcation without hemodynamically significant stenosis. --External carotid artery: No acute abnormality. LEFT CAROTID SYSTEM: --Common carotid artery: Widely patent origin without common carotid artery dissection or aneurysm. --Internal carotid artery: No dissection, occlusion or aneurysm. There is predominantly noncalcified atherosclerosis extending into the proximal ICA, resulting in 50% stenosis. Small area of likely ulcerative plaque within the distal left ICA. --External carotid artery: No acute abnormality. VERTEBRAL ARTERIES: Codominant configuration. At least moderate stenosis of the right vertebral artery origin. The remainder of the right vertebral artery is normal. Normal left vertebral artery. CTA HEAD FINDINGS POSTERIOR CIRCULATION: --Basilar artery: Normal. --Posterior cerebral arteries: Both posterior cerebral arteries are partially supplied by the posterior communicating arteries. --Superior cerebellar arteries: Normal. --Inferior cerebellar arteries: Normal anterior and posterior inferior cerebellar arteries. ANTERIOR CIRCULATION: --Intracranial internal carotid arteries: Atherosclerotic calcification of the internal carotid arteries at the skull base without hemodynamically significant stenosis. --Anterior cerebral arteries: Normal. Both A1 segments are present. Patent anterior communicating artery. --Middle cerebral arteries: Normal. --Posterior communicating arteries: Present bilaterally. VENOUS SINUSES: As permitted by contrast timing, patent. ANATOMIC VARIANTS: None DELAYED PHASE:  No parenchymal contrast enhancement. Review of the MIP images confirms the above findings. IMPRESSION: 1. No emergent large vessel occlusion or high-grade intracranial stenosis. 2. Approximately 50% stenosis of the proximal left internal carotid artery. 3. At least moderate stenosis of the right vertebral artery origin. Aortic Atherosclerosis (ICD10-I70.0) and Emphysema (ICD10-J43.9). Electronically Signed   By: Ulyses Jarred M.D.   On: 02/27/2018 18:04   [name]  Result Date:  02/28/2018 CLINICAL DATA:  Initial evaluation for acute left-sided weakness. EXAM: MRI HEAD WITHOUT CONTRAST TECHNIQUE: Multiplanar, multiecho pulse sequences of the brain and surrounding structures were obtained without intravenous contrast. COMPARISON:  Prior CT and CTA from earlier same day. FINDINGS: Brain: Generalized age-related cerebral atrophy. Mild T2/FLAIR hyperintensity within the periventricular and deep white matter both cerebral hemispheres, nonspecific, but most like related chronic microvascular ischemic disease, minimal for age. Few scattered small remote bilateral cerebellar infarcts noted. Subtle scattered diffusion abnormality seen involving the parasagittal cortical gray matter of the high right frontoparietal region (series 5, images 87, 84, 83). Additional subtle diffusion abnormality seen involving the parasagittal left frontal parietal region, left cingulate (series 5, image 81). Small focus of diffusion abnormality at the anterior left frontal lobe (series 5, image 80). Finding compatible with small acute ischemic infarcts, ACA and/or ACA/MCA distributions. No associated hemorrhage or mass effect. Gray-white matter differentiation otherwise maintained. No other evidence for remote cortical infarction. No foci of susceptibility artifact to suggest acute or chronic intracranial hemorrhage. No mass lesion, midline shift or mass effect. No hydrocephalus. No extra-axial fluid collection. Pituitary gland  within normal limits. Vascular: Major intracranial vascular flow voids maintained at the skull base. Diminutive vertebrobasilar system noted. Skull and upper cervical spine: Craniocervical junction normal. Upper cervical spine within normal limits. Bone marrow signal intensity normal. No scalp soft tissue abnormality. Sinuses/Orbits: Globes and orbital soft tissues within normal limits. Mild scattered mucosal thickening within the ethmoidal air cells. Paranasal sinuses are otherwise largely clear. Trace right mastoid effusion, of doubtful significance. Inner ear structures grossly normal. Other: None. IMPRESSION: 1. Scattered small volume diffusion abnormality involving the parasagittal cerebral hemispheres bilaterally near the vertex, right greater than left, with additional mild left frontal cortical involvement as above, compatible with acute ischemia. Changes are largely ACA distribution, with some MCA and/or ACA-MCA watershed distribution. 2. Few small remote bilateral cerebellar infarcts. 3. Underlying age-related cerebral atrophy with mild chronic microvascular ischemic disease. Electronically Signed   By: Jeannine Boga M.D.   On: 02/28/2018 01:02   Ct Head Code Stroke Wo Contrast  Result Date: 02/27/2018 CLINICAL DATA:  Code stroke.  73 y/o  M; left-sided weakness. EXAM: CT HEAD WITHOUT CONTRAST TECHNIQUE: Contiguous axial images were obtained from the base of the skull through the vertex without intravenous contrast. COMPARISON:  None. FINDINGS: Brain: No evidence of acute infarction, hemorrhage, hydrocephalus, extra-axial collection or mass lesion/mass effect. Nonspecific white matter hypodensities compatible with chronic microvascular ischemic changes volume loss of the brain. Vascular: Density within right M2 superior division may represent thrombus. Skull: Normal. Negative for fracture or focal lesion. Sinuses/Orbits: No acute finding. Other: None. ASPECTS [facility] Stroke Program Early CT  Score) - Ganglionic level infarction (caudate, lentiform nuclei, internal capsule, insula, M1-M3 cortex): 7 - Supraganglionic infarction (M4-M6 cortex): 3 Total score (0-10 with 10 being normal): 10 IMPRESSION: 1. No acute intracranial abnormality identified. 2. Density within right M2 superior division may represent thrombus. 3. Mild chronic microvascular ischemic changes and volume loss of brain. 4. ASPECTS is 10 These results were called by telephone at the time of interpretation on 02/27/2018 at 4:48 pm to Dr. Lorraine Lax, who verbally acknowledged these results. Electronically Signed   By: Kristine Garbe M.D.   On: 02/27/2018 16:49   US Abdomen Limited Ruq  Result Date: 02/28/2018 CLINICAL DATA:  66-year-old with elevated liver function studies. EXAM: ULTRASOUND ABDOMEN LIMITED RIGHT UPPER QUADRANT COMPARISON:  None. FINDINGS: Gallbladder: The gallbladder is mildly distended. There is a 3  mm gallbladder polyp. No gallstones, gallbladder wall thickening or sonographic Murphy's sign. Common bile duct: Diameter: 4 mm Liver: No focal lesion identified. Within normal limits in parenchymal echogenicity. Portal vein is patent on color Doppler imaging with normal direction of blood flow towards the liver. Incidental imaging of the right kidney demonstrates a small cyst in the lower pole measuring 8 mm maximally as well as a complex adjacent echogenic lesion measuring 12 x 10 x 10 mm. This demonstrates no gross blood flow on this limited examination. Echogenic shadowing foci are present in the mid right kidney consistent with calculi. IMPRESSION: 1. No evidence of gallstones, biliary dilatation or cholecystitis. Tiny gallbladder polyp. 2. The liver appears unremarkable. 3. Indeterminate small mass in the lower pole of the right kidney. This could reflect a complex cyst or solid lesion. Further evaluation recommended. Given the patient's renal insufficiency, noncontrast renal MRI is likely the best available  imaging option. Electronically Signed   By: Richardean Sale M.D.   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  Patient with subsequent left hemiparesis and left visual spatial deficits. 1. Does the need for close, 24 hr/day medical supervision in concert with the patient's rehab needs make it unreasonable for this patient to be served in a less intensive setting? Yes 2. Co-Morbidities requiring supervision/potential complications: Hypertension, end-stage renal disease and diabetes 3. Due to bowel management, safety, skin/wound care, disease management, medication administration, pain management and patient education, does the patient require 24 hr/day rehab nursing? Yes 4. Does the patient require coordinated care of a physician, rehab nurse, PT (1-2 hrs/day, 5 days/week), OT (1-2 hrs/day, 5 days/week) and SLP (1-2 hrs/day, 5 days/week) to address physical and functional deficits in the context of the above medical diagnosis(es)? Yes Addressing deficits in the following areas: balance, endurance, locomotion, strength, transferring, bowel/bladder control, bathing, dressing, feeding, grooming, toileting, cognition, speech and psychosocial support 5. Can the patient actively participate in an intensive therapy program of at least 3 hrs of therapy per day at least 5 days per week? Yes 6. The potential for patient to make measurable gains while on inpatient rehab is excellent 7. Anticipated functional outcomes upon discharge from inpatient rehab are modified independent and supervision  with PT, modified independent and supervision with OT, modified independent and supervision with SLP. 8. Estimated rehab length of stay to reach the above functional goals is: 10-14 days 9. Anticipated D/C setting: Home 10. Anticipated post D/C treatments: [facility] therapy 11. Overall Rehab/Functional  Prognosis: excellent  RECOMMENDATIONS: This patient's condition is appropriate for continued rehabilitative care in the following setting: CIR Patient has agreed to participate in recommended program. Yes Note that insurance prior authorization may be required for reimbursement for recommended care.  Comment: Patient lives alone however daughter can provide some assistance at discharge. Rehab Admissions Coordinator to follow up.  Thanks,  Meredith Staggers, MD, Mellody Drown  I have personally performed a face to face diagnostic evaluation of this patient. Additionally, I have examined pertinent labs and radiographic images.  Meredith Staggers, MD 03/01/2018

## 2018-03-02 ENCOUNTER — Inpatient Hospital Stay (HOSPITAL_COMMUNITY): Payer: Medicare HMO

## 2018-03-02 DIAGNOSIS — R945 Abnormal results of liver function studies: Secondary | ICD-10-CM

## 2018-03-02 DIAGNOSIS — I6389 Other cerebral infarction: Secondary | ICD-10-CM

## 2018-03-02 DIAGNOSIS — I159 Secondary hypertension, unspecified: Secondary | ICD-10-CM

## 2018-03-02 LAB — CBC WITH DIFFERENTIAL/PLATELET
Abs Immature Granulocytes: 0.04 10*3/uL (ref 0.00–0.07)
Basophils Absolute: 0.1 10*3/uL (ref 0.0–0.1)
Basophils Relative: 1 %
Eosinophils Absolute: 1.4 10*3/uL — ABNORMAL HIGH (ref 0.0–0.5)
Eosinophils Relative: 14 %
HCT: 34.7 % — ABNORMAL LOW (ref 39.0–52.0)
Hemoglobin: 11.2 g/dL — ABNORMAL LOW (ref 13.0–17.0)
Immature Granulocytes: 0 %
Lymphocytes Relative: 34 %
Lymphs Abs: 3.3 10*3/uL (ref 0.7–4.0)
MCH: 28.9 pg (ref 26.0–34.0)
MCHC: 32.3 g/dL (ref 30.0–36.0)
MCV: 89.4 fL (ref 80.0–100.0)
Monocytes Absolute: 0.9 10*3/uL (ref 0.1–1.0)
Monocytes Relative: 9 %
NEUTROS PCT: 42 %
Neutro Abs: 4 10*3/uL (ref 1.7–7.7)
Platelets: 193 10*3/uL (ref 150–400)
RBC: 3.88 MIL/uL — ABNORMAL LOW (ref 4.22–5.81)
RDW: 15.7 % — ABNORMAL HIGH (ref 11.5–15.5)
WBC: 9.6 10*3/uL (ref 4.0–10.5)
nRBC: 0.2 % (ref 0.0–0.2)

## 2018-03-02 LAB — BASIC METABOLIC PANEL
ANION GAP: 15 (ref 5–15)
BUN: 80 mg/dL — ABNORMAL HIGH (ref 8–23)
CALCIUM: 8.3 mg/dL — AB (ref 8.9–10.3)
CO2: 22 mmol/L (ref 22–32)
Chloride: 99 mmol/L (ref 98–111)
Creatinine, Ser: 14.97 mg/dL — ABNORMAL HIGH (ref 0.61–1.24)
GFR calc Af Amer: 3 mL/min — ABNORMAL LOW (ref >60)
GFR calc non Af Amer: 3 mL/min — ABNORMAL LOW (ref >60)
Glucose, Bld: 89 mg/dL (ref 70–99)
Potassium: 5.2 mmol/L — ABNORMAL HIGH (ref 3.5–5.1)
Sodium: 136 mmol/L (ref 135–145)

## 2018-03-02 MED ORDER — FLUTICASONE PROPIONATE 50 MCG/ACT NA SUSP
2.0000 | Freq: Two times a day (BID) | NASAL | Status: DC
  Administered 2018-03-02 – 2018-03-29 (×47): 2 via NASAL
  Filled 2018-03-02 (×3): qty 16

## 2018-03-02 NOTE — Progress Notes (Signed)
IP rehab admissions - I met with patient and his daughter at the bedside.  They would like inpatient rehab admission.  I have opened the case and faxed information to [employer] requesting acute inpatient rehab admission.  I will follow up once I hear back from insurance carrier.  Call me for questions.  #[phone] 

## 2018-03-02 NOTE — Progress Notes (Signed)
HD treatment has been modified to 03/03/2018, by Dr Jonnie Finner.  Notified Theodosia Quay, RN of above

## 2018-03-02 NOTE — Progress Notes (Signed)
Occupational Therapy Treatment Patient Details Name: Peter [name] MRN: [id number] DOB: [birth year] Today's Date: 03/02/2018    History of present illness Pt is a 73 y.o. male admitted 02/27/18 with L-sided weakness while at dialysis. MRI shows small volume diffusion abnormality involving parasagittal cerebral hemispheres, additional L frontal cortical involvement compatible with acute ischemia; remote bilat cerebellar infarcts. PMH includes HTN, ESRD on HD.   OT comments  Pt progressing towards established OT goals and continues to be highly motivated to participate in therapy. Pt continues to present with decreased processing and problem solving requiring increased time and cues throughout ADLs. Focused session on incorporating LUE into ADLs with normalized movement patterns and attending to left side of visual field. Pt performing grooming task at sink with Min Guard A and Min cues. Continue to recommend dc to CIR for intensive rehab and will continue to follow acutely as admitted.    Follow Up Recommendations  CIR;Supervision/Assistance - 24 hour    Equipment Recommendations  Other (comment)(Defer to next venue)    Recommendations for Other Services Rehab consult;PT consult;Speech consult    Precautions / Restrictions Precautions Precautions: Fall Precaution Comments: Inattention to left side (body and environment) Restrictions Weight Bearing Restrictions: No       Mobility Bed Mobility Overal bed mobility: Needs Assistance Bed Mobility: Supine to Sit     Supine to sit: Min guard;HOB elevated     General bed mobility comments: Min Guard A for safety. No assist needed.  Transfers Overall transfer level: Needs assistance Equipment used: None Transfers: Sit to/from Stand Sit to Stand: Min assist;From elevated surface;Min guard         General transfer comment: Min A for balance and cues for LUE.     Balance Overall balance assessment: Needs assistance Sitting-balance  support: No upper extremity supported;Feet supported Sitting balance-Leahy Scale: Fair     Standing balance support: During functional activity Standing balance-Leahy Scale: Poor Standing balance comment: Requires external support or UE support for standing balance.                            ADL either performed or assessed with clinical judgement   ADL Overall ADL's : Needs assistance/impaired     Grooming: Min guard;Minimal assistance;Standing;Oral care;Applying deodorant;Wash/dry hands Grooming Details (indicate cue type and reason): Pt requiring Min Guard A for safety while standing at sink. Pt requiring cues for locating light switch. Visually scanning environment to locate grooming items in left visual field. Tendency to leave LUE by his side instead of using during tasks (unless required for bilateral coorindation)     Lower Body Bathing: Min guard;Sit to/from stand Lower Body Bathing Details (indicate cue type and reason): Pt requiring Min Guard A for safety with bendinf forward to apply lotion to BLEs. Cues for pt to incorporate LUE and cross midline. Pt requiring increased time for processing.          Toilet Transfer: Ambulation;RW;Min Set designer Details (indicate cue type and reason): Min Guard A for safety and significant time for processing.          Functional mobility during ADLs: Minimal assistance;Rolling walker;Min guard(Cues for attending to left) General ADL Comments: Continues to be highly motivated. Focused session on normalized movement of LUE and attending to left side (body and environment)     Vision   Vision Assessment?: Yes Eye Alignment: Within Functional Limits Ocular Range of Motion: Impaired-to be further tested in functional  context(Poor tracking vertically) Alignment/Gaze Preference: Head turned(tendency for head turn to right) Tracking/Visual Pursuits: Decreased smoothness of horizontal tracking;Decreased  smoothness of vertical tracking;Requires cues, head turns, or add eye shifts to track;Unable to hold eye position out of midline;Impaired - to be further tested in functional context(Poor tracking vertically. noting jumpy movements throughout) Depth Perception: Undershoots Additional Comments: Demonstraitng increased tracking of whole visual field. Continued preference for right gaze   Perception     Praxis      Cognition Arousal/Alertness: Awake/alert Behavior During Therapy: WFL for tasks assessed/performed Overall Cognitive Status: Impaired/Different from baseline Area of Impairment: Attention;Problem solving;Following commands;Awareness                   Current Attention Level: Selective Memory: Decreased short-term memory Following Commands: Follows one step commands with increased time   Awareness: Emergent Problem Solving: Slow processing;Requires verbal cues;Difficulty sequencing General Comments: Increased time to process, sequence and follow multi step commands. Poor awareness of deficits. Knows he had a stroke. Left inattention to body.        Exercises     Shoulder Instructions       General Comments Daughter present throughotu    Pertinent Vitals/ Pain       Pain Assessment: No/denies pain Pain Location: Headache Pain Descriptors / Indicators: Headache  Home Living                                          Prior Functioning/Environment              Frequency  Min 3X/week        Progress Toward Goals  OT Goals(current goals can now be found in the care plan section)  Progress towards OT goals: Progressing toward goals  Acute Rehab OT Goals Patient Stated Goal: Return to normal OT Goal Formulation: With patient/family Time For Goal Achievement: 03/14/18 Potential to Achieve Goals: Good ADL Goals Pt Will Perform Grooming: with supervision;standing Pt Will Perform Upper Body Dressing: with supervision;sitting Pt Will  Perform Lower Body Dressing: with supervision;sit to/from stand Pt Will Transfer to Toilet: with supervision;regular height toilet;ambulating Pt Will Perform Toileting - Clothing Manipulation and hygiene: with supervision;sit to/from stand Additional ADL Goal #1: Pt will locate 4/5 ADL items in left visual field with Min cues Additional ADL Goal #2: Pt will incorporate LUE into ADLs 75% of time with 2-3 cues  Plan Discharge plan remains appropriate    Co-evaluation                 AM-PAC OT "6 Clicks" Daily Activity     Outcome Measure   Help from another person eating meals?: A Little Help from another person taking care of personal grooming?: A Little Help from another person toileting, which includes using toliet, bedpan, or urinal?: A Little Help from another person bathing (including washing, rinsing, drying)?: A Lot Help from another person to put on and taking off regular upper body clothing?: A Little Help from another person to put on and taking off regular lower body clothing?: A Lot 6 Click Score: 16    End of Session Equipment Utilized During Treatment: Rolling walker  OT Visit Diagnosis: Unsteadiness on feet (R26.81);Other abnormalities of gait and mobility (R26.89);Muscle weakness (generalized) (M62.81);Other symptoms and signs involving cognitive function;Pain;Hemiplegia and hemiparesis;Low vision, both eyes (H54.2) Hemiplegia - Right/Left: Left Hemiplegia - dominant/non-dominant: Non-Dominant Hemiplegia - caused  by: Cerebral infarction Pain - part of body: (HA)   Activity Tolerance Patient tolerated treatment well   Patient Left in chair;with call bell/phone within reach;with chair alarm set;with family/visitor present   Nurse Communication Mobility status        Time: 4619-0122 OT Time Calculation (min): 24 min  Charges: OT General Charges $OT Visit: 1 Visit OT Treatments $Self Care/Home Management : 23-37 mins  [place], OTR/L Acute  Rehab Pager: [phone] Office: [place] 03/02/2018, 1:51 PM

## 2018-03-02 NOTE — Progress Notes (Signed)
PROGRESS NOTE    Peter [name]  JFH:[id number] DOB: [birth year] DOA: 02/27/2018 PCP: Patient, No Pcp Per    Brief Narrative:  73 year old male who presented with weakness.  He does have significant past medical history for end-stage renal disease on hemodialysis and hypertension.  He developed left sided weakness right after finishing dialysis, he had severe left-sided weakness to the point that where he could not get out of the chair.  Physical examination blood pressure was 190/72, heart rate 70, respiratory rate 16, temperature 98.9, oxygen saturation 98% his lungs were clear to auscultation bilaterally, heart S1-S2 present and rhythmic, the abdomen was soft nontender.  Patient was slow to respond to questions, left side weakness 4 out of 5 upper and lower extremities.  Sensation to light touch grossly intact.  Strength on the right side was preserved 5 out of 5.  39, potassium 4.1, chloride 101, bicarb 24, glucose 134, BUN 31, creatinine 8.4, troponin 0.22, 0.26, 0.22, white cell count 7.1, hemoglobin 13.2, hematocrit 41.9, platelets 194.  His head CT had a density within the right M2 superior division possible thrombus.  Patient was admitted to the hospital with a working diagnosis of acute CVA.   Assessment & Plan:   Principal Problem:   Acute CVA (cerebrovascular accident) ([place]) Active Problems:   Elevated troponin   Elevated LFTs   HTN (hypertension)   ESRD on dialysis ([place])   CAD (coronary artery disease)   Diabetes ([facility])   CHF (congestive heart failure) ([place])   Traumatic brain injury ([place])   Anemia   Right kidney mass   TIA (transient ischemic attack)  1. Acute CVA, left anterior cerebral artery distribution with some MCA, watershed distribution, suspected embolic. Persistent left upper extremity weakness. Continue antiplatelet therapy with full dose aspirin and clopidogrel. Statin therapy with atorvastatin. Cardiology consulted for TEE and possible loop recorder, to rule out  paroxysmal atrial fibrillation.  2. HTN. On amlodipine for blood pressure control.   3. ESRD. Clinically patient is euvolemic, will continue to follow nephrology recommendations.   4. Chronic anemia of chronic renal disease. Stable cell count.   5. T2DM. Glucose cover and monitoring with iss.    6. Systolic heart failure, LV systolic function 30 to 93%, Ischemic cardiomyopathy. Volume status is stable, will continue to hold on losartan for now due to risk of hypotension. Patient will have ultrafiltration on HD.   7. Right renal mass. Plan for outpatient follow up.   DVT prophylaxis: heparin   Code Status:  full Family Communication: no family at the bedside  Disposition Plan/ discharge barriers: pending clinical improvement and cardiac workup. Inpatient Rehabilitation.    Body mass index is 19.37 kg/m. Malnutrition Type:      Malnutrition Characteristics:      Nutrition Interventions:     RN Pressure Injury Documentation:     Consultants:   Neurology   Cardiology    Procedures:     Antimicrobials:       Subjective: Patient has worsening left upper extremity weakness last night, this am is more stable, but continue to be very weak. Positive nasal congestion and cough, no nausea or vomiting.   Objective: Vitals:   03/01/18 1630 03/01/18 1959 03/02/18 0250 03/02/18 0408  BP:   (!) 183/70 (!) 167/74  Pulse: 70 66 69 66  Resp: 18  18 18   Temp: 98.2 F (36.8 C) 98.3 F (36.8 C) 98 F (36.7 C)   TempSrc: Oral Oral Oral   SpO2: 99%  100%  100%  Weight:      Height:        Intake/Output Summary (Last 24 hours) at 03/02/2018 1029 Last data filed at 03/02/2018 0700 Gross per 24 hour  Intake 320 ml  Output -  Net 320 ml   Filed Weights   02/27/18 1700  Weight: 70.3 kg    Examination:   General: Not in pain or dyspnea, deconditioned  Neurology: Awake and alert, non focal/ decreased strength 3/5 on the left upper extremity.   E ENT:  positive pallor, no icterus, oral mucosa moist Cardiovascular: No JVD. S1-S2 present, rhythmic, no gallops, rubs, or murmurs. No lower extremity edema. Pulmonary: decreased breath sounds bilaterally, adequate air movement, no wheezing, rhonchi or rales. Gastrointestinal. Abdomen with no organomegaly, non tender, no rebound or guarding. PD catheter in place.  Skin. No rashes Musculoskeletal: no joint deformities Right IJ HD catheter in placed     Data Reviewed: I have personally reviewed following labs and imaging studies  CBC: Recent Labs  Lab 02/27/18 1658 03/02/18 0547  WBC 7.1 9.6  NEUTROABS 4.2 4.0  HGB 13.2 11.2*  HCT 41.9 34.7*  MCV 91.1 89.4  PLT 194 182   Basic Metabolic Panel: Recent Labs  Lab 02/27/18 1658 02/27/18 1703 03/02/18 0547  NA 139  --  136  K 4.1  --  5.2*  CL 101  --  99  CO2 24  --  22  GLUCOSE 134*  --  89  BUN 31*  --  80*  CREATININE 8.45* 8.60* 14.97*  CALCIUM 9.3  --  8.3*   GFR: Estimated Creatinine Clearance: 4.6 mL/min (A) (by C-G formula based on SCr of 14.97 mg/dL (H)). Liver Function Tests: Recent Labs  Lab 02/27/18 1658  AST 28  ALT 42  ALKPHOS 141*  BILITOT 1.5*  PROT 8.9*  ALBUMIN 3.5   No results for input(s): LIPASE, AMYLASE in the last 168 hours. No results for input(s): AMMONIA in the last 168 hours. Coagulation Profile: Recent Labs  Lab 02/27/18 1658  INR 1.08   Cardiac Enzymes: Recent Labs  Lab 02/27/18 2251 02/28/18 0207 02/28/18 0752  TROPONINI 0.22* 0.26* 0.22*   BNP (last 3 results) No results for input(s): PROBNP in the last 8760 hours. HbA1C: Recent Labs    02/28/18 0207  HGBA1C 6.6*   CBG: No results for input(s): GLUCAP in the last 168 hours. Lipid Profile: Recent Labs    02/28/18 0207  CHOL 106  HDL 36*  LDLCALC 48  TRIG 108  CHOLHDL 2.9   Thyroid Function Tests: No results for input(s): TSH, T4TOTAL, FREET4, T3FREE, THYROIDAB in the last 72 hours. Anemia Panel: No results  for input(s): VITAMINB12, FOLATE, FERRITIN, TIBC, IRON, RETICCTPCT in the last 72 hours.    Radiology Studies: I have reviewed all of the imaging during this hospital visit personally     Scheduled Meds: . amLODipine  5 mg Oral QHS  . aspirin EC  325 mg Oral Daily  . atorvastatin  40 mg Oral q1800  . clopidogrel  75 mg Oral Daily  . famotidine  20 mg Oral QHS  . heparin  5,000 Units Subcutaneous Q8H  . metoprolol succinate  200 mg Oral Daily  . multivitamin  1 tablet Oral Daily   Continuous Infusions:   LOS: 2 days        Ilsa Bonello Gerome Apley, MD

## 2018-03-02 NOTE — Progress Notes (Signed)
Pt called staff to the room and reported that he can't move his left arm but he used it earlier on the shift. MD came to the room after he was paged. New orders were received. Pt is currently A/O x4. Denies any other problems. VSS. Will be going to the radiology dept soon for CT Scan.

## 2018-03-02 NOTE — Progress Notes (Signed)
Admit: 02/27/2018 LOS: 2  82M ESRD, recent PD --> HD with acute CVAs  Subjective:  Peter [name] Peter [name]  MRI this AM stable, LUE paresis . CIR eval pending  02/16 0701 - 02/17 0700 In: 320 [P.O.:320] Out: -   Filed Weights   02/27/18 1700  Weight: 70.3 kg    Scheduled Meds: . amLODipine  5 mg Oral QHS  . aspirin EC  325 mg Oral Daily  . atorvastatin  40 mg Oral q1800  . clopidogrel  75 mg Oral Daily  . famotidine  20 mg Oral QHS  . heparin  5,000 Units Subcutaneous Q8H  . metoprolol succinate  200 mg Oral Daily  . multivitamin  1 tablet Oral Daily   Continuous Infusions: PRN Meds:.acetaminophen, polyvinyl alcohol, senna-docusate  Current Labs: reviewed    Physical Exam:  Blood pressure (!) 167/74, pulse 66, temperature 98 F (36.7 C), temperature source Oral, resp. rate 18, height '6\' 3"'  (1.905 m), weight 70.3 kg, SpO2 100 %. RRR nl s1s2 R IJ TDC C/D/I CTAB No sig LEE Weakness in LUE  A 1. ESRD on HD. Was on PD until admitted for PEA arrest Peter [name] late 2019; [name] MD Neph follows 2. Acute CVA, L sided weakness; neuro following; ? To CIR 3. Anemia, Hb stable 4. CKD BMD, use Tums 5. chrnoic systolc CHF 6. HTN, 5 meds as outpatient. Permissive HTN for now  P . HD today on schedule.  Avoid IDH. Tight heparin. 2K . Medication Issues; o Preferred narcotic agents for pain control are hydromorphone, fentanyl, and methadone. Morphine should not be used.  o Baclofen should be avoided o Avoid oral sodium phosphate and magnesium citrate based laxatives / bowel preps    Peter Grippe MD 03/02/2018, 11:07 AM  Recent Labs  Lab 02/27/18 1658 02/27/18 1703 03/02/18 0547  NA 139  --  136  K 4.1  --  5.2*  CL 101  --  99  CO2 24  --  22  GLUCOSE 134*  --  89  BUN 31*  --  80*  CREATININE 8.45* 8.60* 14.97*  CALCIUM 9.3  --  8.3*   Recent Labs  Lab 02/27/18 1658 03/02/18 0547  WBC 7.1 9.6  NEUTROABS 4.2 4.0  HGB 13.2 11.2*  HCT 41.9 34.7*  MCV 91.1 89.4  PLT 194 193

## 2018-03-02 NOTE — Progress Notes (Signed)
STROKE TEAM PROGRESS NOTE       SUBJECTIVE (INTERVAL HISTORY) His daughter is at the bedside The patient had transient left hemiplegia and stat Ct was obtained which was unchanged from admission and he has improved to baseline this am. MRI scan of the brain done this morning shows no change compared to previous MRI 3 days ago. OBJECTIVE Vitals:   03/01/18 1959 03/02/18 0250 03/02/18 0408 03/02/18 1136  BP:  (!) 183/70 (!) 167/74 (!) 154/64  Pulse: 66 69 66 63  Resp:  18 18 18   Temp: 98.3 F (36.8 C) 98 F (36.7 C)  97.7 F (36.5 C)  TempSrc: Oral Oral  Oral  SpO2:  100% 100%   Weight:      Height:        CBC:  Recent Labs  Lab 02/27/18 1658 03/02/18 0547  WBC 7.1 9.6  NEUTROABS 4.2 4.0  HGB 13.2 11.2*  HCT 41.9 34.7*  MCV 91.1 89.4  PLT 194 308    Basic Metabolic Panel:  Recent Labs  Lab 02/27/18 1658 02/27/18 1703 03/02/18 0547  NA 139  --  136  K 4.1  --  5.2*  CL 101  --  99  CO2 24  --  22  GLUCOSE 134*  --  89  BUN 31*  --  80*  CREATININE 8.45* 8.60* 14.97*  CALCIUM 9.3  --  8.3*    Lipid Panel:     Component Value Date/Time   CHOL 106 02/28/2018 0207   TRIG 108 02/28/2018 0207   HDL 36 (L) 02/28/2018 0207   CHOLHDL 2.9 02/28/2018 0207   VLDL 22 02/28/2018 0207   LDLCALC 48 02/28/2018 0207   HgbA1c:  Lab Results  Component Value Date   HGBA1C 6.6 (H) 02/28/2018   Urine Drug Screen: No results found for: LABOPIA, COCAINSCRNUR, LABBENZ, AMPHETMU, THCU, LABBARB  Alcohol Level     Component Value Date/Time   ETH <10 02/27/2018 1658    IMAGING  Ct Angio Head W Or Wo Contrast Ct Angio Neck W Or Wo Contrast 02/27/2018 IMPRESSION:  1. No emergent large vessel occlusion or high-grade intracranial stenosis.  2. Approximately 50% stenosis of the proximal left internal carotid artery.  3. At least moderate stenosis of the right vertebral artery origin.   Aortic Atherosclerosis (ICD10-I70.0) and Emphysema (ICD10-J43.9).    Mr Brain Wo  Contrast 02/28/2018 IMPRESSION:  1. Scattered small volume diffusion abnormality involving the parasagittal cerebral hemispheres bilaterally near the vertex, right greater than left, with additional mild left frontal cortical involvement as above, compatible with acute ischemia. Changes are largely ACA distribution, with some MCA and/or ACA-MCA watershed distribution.  2. Few small remote bilateral cerebellar infarcts.   3. Underlying age-related cerebral atrophy with mild chronic microvascular ischemic disease.    Ct Head Code Stroke Wo Contrast 02/27/2018 IMPRESSION:  1. No acute intracranial abnormality identified.  2. Density within right M2 superior division may represent thrombus.  3. Mild chronic microvascular ischemic changes and volume loss of brain.  4. ASPECTS is 10     TTE :  Moderate to severe reduced systolic function with ejection fraction 30-35%. Moderate concentric left ventricle hypertrophy. Diffuse hypokinesis. No definite clot noted.     PHYSICAL EXAM Blood pressure (!) 154/64, pulse 63, temperature 97.7 F (36.5 C), temperature source Oral, resp. rate 18, height 6\' 3"  (1.905 m), weight 70.3 kg, SpO2 100 %. Pleasant elderly male not in distress. . Afebrile. Head is nontraumatic. Neck is supple without  bruit.    Cardiac exam no murmur or gallop. Lungs are clear to auscultation. Distal pulses are well felt.  Neurological Exam ;  Awake  Alert oriented x 3. Normal speech and language.eye movements full without nystagmus.fundi were not visualized. Vision acuity and fields appear normal. Hearing is normal. Palatal movements are normal. Face symmetric. Tongue midline. Mild left hemiparesis 4/5 strength Orbits right over left upper extremity. Normal sensation. Gait deferred.      ASSESSMENT/PLAN [name] is a 73 y.o. 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  He did not receive IV t-PA due to mild deficits.  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  Resultant  Mild left hand weakness  CT head - Density within right M2 superior division may represent thrombus.   MRI head - Changes are largely ACA distribution, with some MCA and/or ACA-MCA watershed distribution. Few small remote bilateral cerebellar infarcts.   MRA head - not performed  CTA H&N - No emergent large vessel occlusion or high-grade intracranial stenosis.   Carotid Doppler - CTA neck performed - carotid dopplers not indicated.  2D Echo - diminished ejection fraction 30-35% with diffuse hypokinesis  LDL - 48  HgbA1c - 6.6  UDS - pending  VTE prophylaxis - [place] Heparin  Diet - Heart healthy with thin liquids.  aspirin 325 mg daily prior to admission, now on aspirin 325 mg daily and plavix  Patient counseled to be compliant with his antithrombotic medications  Ongoing aggressive stroke risk factor management  Therapy recommendations:  CIR recommended  Disposition:  Pending  Hypertension  Blood pressure somewhat high at times but within post stroke parameters . Permissive hypertension (OK if < 220/120) but gradually normalize in 5-7 days . Long-term BP goal normotensive  Hyperlipidemia  Lipid lowering medication PTA:  Lipitor 40 mg daily  LDL 48, goal < 70  Current lipid lowering medication: Lipitor 40 mg daily  Continue statin at discharge  Diabetes  HgbA1c 6.6, goal < 7.0  Controlled  Other Stroke Risk Factors  Advanced age  Former cigarette smoker - quit  Hx stroke/TIA (by imaging)  Coronary artery disease   Other Active Problems  ESRD  May need TEE   Hospital day # 2   .  He presented with left-sided weakness and MRI show multiple small embolic anterior circulation infarcts likely from a central cardiac source. Strong clinical suspicion for A. Fib but  review of patient's chart including care everywhere does not found  definite documentation of A. Fib yet. Continue cardiac monitoring and await TEE and prolonged cardiac monitoring . Continue aspirin   and  add Plavix for 3 weeks.D/W Dr Riccardo Dubin and patient's daughter.Greater than 50% time during this 25 minute visit was spent on counseling and coordination of care about his embolic strokes and discussion of evaluation and treatment plan and answered questions. Antony Contras, MD Medical Director [facility] Pager: [phone] 03/02/2018 2:35 PM  To contact Stroke Continuity provider, please refer to [email or web address] After hours, contact General Neurology

## 2018-03-02 NOTE — Progress Notes (Addendum)
On-call cross coverage note  Called by patient's RN regarding patient being flaccid in the left arm, which is a change from before. On examination, patient is 3/5 in the left upper extremity-certainly weaker than the documented exam from this morning. He has embolic-looking strokes on MRI and is not a candidate for any acute intervention given the recent stroke 2 days ago. CTA head and neck with no emergent LVO on 02/27/2018 but 50% stenosis of the proximal left internal carotid and moderate stenosis of the right vertebral artery origin. A stat CT head was ordered.  Formal read pending. Reviewed by me personally- no acute changes.  Hyperdense looking right MCA which is also unchanged from the admission CT scan but some GW differentiation loss in right frontal area.  Will order a repeat MRI brain. Stroke team to continue to follow in the morning    -- Amie Portland, MD [facility] Pager: [phone] If 7pm to 7am, please call on call as listed on AMION.

## 2018-03-02 NOTE — H&P (View-Only) (Signed)
ELECTROPHYSIOLOGY CONSULT NOTE  Patient ID: Peter [name] MRN: [id number], DOB/AGE: January 25, 1945   Admit date: 02/27/2018 Date of Consult: 03/02/2018  Primary Physician: Patient, No Pcp Per Primary Cardiologist: Dr. Beatrix Fetters Reason for Consultation: Cryptogenic stroke ; recommendations regarding Implantable Loop Recorder, requested by Dr. Cathlean Sauer  History of Present Illness Peter [name] was admitted on 02/27/2018 with a stroke   PMHx of CAD (PCI feb 2018), ICM (LVEF 30-35% by echo Nov 2019 by last cards note after a in-hospital PEA arrest felt to have been 2/2 metabolic acidosis and respiratory causes), HTN, ESRF on HD (also has a peritoneal catheter in place), HLD, chronic CHF (systolic), DM, and PVD Neurology notes Multiple infarcts - embolic - unknown source..  he has undergone workup for stroke including echocardiogram and carotid angio.  The patient has been monitored on telemetry which has demonstrated sinus rhythm with no arrhythmias.  Inpatient stroke work-up is to be completed with a TEE.   Echocardiogram this admission demonstrated  IMPRESSIONS  1. The left ventricle has moderate-severely reduced systolic function, with an ejection fraction of 30-35%. The cavity size was mild to moderately dilated. There is moderate concentric left ventricular hypertrophy. Left ventricular diastolic Doppler  parameters are consistent with impaired relaxation Left ventricular diffuse hypokinesis.  2. The right ventricle has normal systolic function. The cavity was normal. There is no increase in right ventricular wall thickness.  3. Left atrial size was moderately dilated.  4. The mitral valve is normal in structure. Moderate thickening of the mitral valve leaflet. Moderate calcification of the anterior and posterior mitral valve leaflets. There is moderate mitral annular calcification present. Mild mitral valve stenosis.  5. The tricuspid valve is normal in structure.  6. The aortic valve is tricuspid  Moderate thickening of the aortic valve Moderate calcification of the aortic valve. Aortic valve regurgitation is moderate by color flow Doppler. mild stenosis of the aortic valve.  7. Right atrial pressure is estimated at 3 mmHg.  Lab work is reviewed.   Prior to admission, the patient denies chest pain, shortness of breath, dizziness, palpitations, or syncope. There is mention of unclear history of AFib though the patient does not recall this definitively, and I do not see any clear documentation of this. He is recovering from his stroke with plans to CIR at discharge.   Past Medical History:  Diagnosis Date  . Anemia   . CAD (coronary artery disease)   . CHF (congestive heart failure) ([place])   . Diabetes ([place])   . ESRD (end stage renal disease) on dialysis ([place])   . Headache   . HTN (hypertension)   . PEA (Pulseless electrical activity) ([place]) 11/2017  . Traumatic brain injury [facility])      Surgical History: History reviewed. No pertinent surgical history.   Medications Prior to Admission  Medication Sig Dispense Refill Last Dose  . acetaminophen (TYLENOL) 500 MG tablet Take 1,500 mg by mouth daily as needed (pain).    week ago  . amLODipine (NORVASC) 5 MG tablet Take 5 mg by mouth at bedtime.   02/26/2018 at pm  . aspirin EC 325 MG tablet Take 325 mg by mouth daily.   02/26/2018 at am  . atorvastatin (LIPITOR) 40 MG tablet Take 40 mg by mouth daily at 6 PM.   02/26/2018 at pm  . Calcium Carbonate Antacid (TUMS PO) Take 1-3 mg by mouth See admin instructions. Take 3 tablets by mouth up to three times daily with full meals, take 1 tablet  with snacks/small meals   02/26/2018 at pm  . famotidine (PEPCID) 20 MG tablet Take 20 mg by mouth at bedtime.   02/26/2018 at pm  . furosemide (LASIX) 40 MG tablet Take 40 mg by mouth 2 (two) times daily.   02/26/2018 at pm  . isosorbide mononitrate (IMDUR) 60 MG 24 hr tablet Take 60 mg by mouth daily.   02/26/2018 at am  . losartan (COZAAR) 100 MG tablet  Take 100 mg by mouth daily.   02/26/2018 at am  . metoprolol (TOPROL-XL) 200 MG 24 hr tablet Take 200 mg by mouth daily.   02/26/2018 at 1000  . multivitamin (RENA-VIT) TABS tablet Take 1 tablet by mouth daily.   02/26/2018 at am  . nitroGLYCERIN (NITROSTAT) 0.4 MG SL tablet Place 0.4 mg under the tongue every 5 (five) minutes as needed for chest pain.    never taken  . polyvinyl alcohol (ARTIFICIAL TEARS) 1.4 % ophthalmic solution Place 1 drop into both eyes daily as needed for dry eyes.   1-2 weeks ago    Inpatient Medications:  . amLODipine  5 mg Oral QHS  . aspirin EC  325 mg Oral Daily  . atorvastatin  40 mg Oral q1800  . clopidogrel  75 mg Oral Daily  . famotidine  20 mg Oral QHS  . fluticasone  2 spray Each Nare BID  . heparin  5,000 Units Subcutaneous Q8H  . metoprolol succinate  200 mg Oral Daily  . multivitamin  1 tablet Oral Daily    Allergies:  Allergies  Allergen Reactions  . Metformin And Related Other (See Comments)    Contraindicated due to renal failure.  Renal failure-on peritoneal dialysis    . Pioglitazone Other (See Comments)    History of congestive heart failure.    . Propoxyphene Rash    Social History   Socioeconomic History  . Marital status: Single    Spouse name: Not on file  . Number of children: Not on file  . Years of education: Not on file  . Highest education level: Not on file  Occupational History  . Not on file  Social Needs  . Financial resource strain: Not on file  . Food insecurity:    Worry: Not on file    Inability: Not on file  . Transportation needs:    Medical: Not on file    Non-medical: Not on file  Tobacco Use  . Smoking status: Former Smoker  Substance and Sexual Activity  . Alcohol use: Never    Frequency: Never  . Drug use: Never  . Sexual activity: Not Currently  Lifestyle  . Physical activity:    Days per week: Not on file    Minutes per session: Not on file  . Stress: Not on file  Relationships  . Social  connections:    Talks on phone: Not on file    Gets together: Not on file    Attends religious service: Not on file    Active member of club or organization: Not on file    Attends meetings of clubs or organizations: Not on file    Relationship status: Not on file  . Intimate partner violence:    Fear of current or ex partner: Not on file    Emotionally abused: Not on file    Physically abused: Not on file    Forced sexual activity: Not on file  Other Topics Concern  . Not on file  Social History Narrative  .  Not on file     Family: Mother had HTN, father had an aortic aneurysm   Review of Systems: All other systems reviewed and are otherwise negative except as noted above.  Physical Exam: Vitals:   03/01/18 1959 03/02/18 0250 03/02/18 0408 03/02/18 1136  BP:  (!) 183/70 (!) 167/74 (!) 154/64  Pulse: 66 69 66 63  Resp:  18 18 18   Temp: 98.3 F (36.8 C) 98 F (36.7 C)  97.7 F (36.5 C)  TempSrc: Oral Oral  Oral  SpO2:  100% 100%   Weight:      Height:        GEN- The patient is well appearing, alert and oriented x 3 today.   Head- normocephalic, atraumatic Eyes-  Sclera clear, conjunctiva pink Ears- hearing intact Oropharynx- clear Neck- supple Lungs- CTA, normal work of breathing Heart- RRR, no murmurs, rubs or gallops  GI- soft, NT, ND Extremities- no clubbing, cyanosis, or edema MS- no significant deformity or atrophy Skin- no rash or lesion Psych- euthymic mood, full affect   Labs:   Lab Results  Component Value Date   WBC 9.6 03/02/2018   HGB 11.2 (L) 03/02/2018   HCT 34.7 (L) 03/02/2018   MCV 89.4 03/02/2018   PLT 193 03/02/2018    Recent Labs  Lab 02/27/18 1658  03/02/18 0547  NA 139  --  136  K 4.1  --  5.2*  CL 101  --  99  CO2 24  --  22  BUN 31*  --  80*  CREATININE 8.45*   < > 14.97*  CALCIUM 9.3  --  8.3*  PROT 8.9*  --   --   BILITOT 1.5*  --   --   ALKPHOS 141*  --   --   ALT 42  --   --   AST 28  --   --   GLUCOSE 134*   --  89   < > = values in this interval not displayed.   Lab Results  Component Value Date   TROPONINI 0.22 (HH) 02/28/2018   Lab Results  Component Value Date   CHOL 106 02/28/2018   Lab Results  Component Value Date   HDL 36 (L) 02/28/2018   Lab Results  Component Value Date   LDLCALC 48 02/28/2018   Lab Results  Component Value Date   TRIG 108 02/28/2018   Lab Results  Component Value Date   CHOLHDL 2.9 02/28/2018   No results found for: LDLDIRECT  No results found for: DDIMER   Radiology/Studies:   Ct Angio Head W Or Wo Contrast Result Date: 02/27/2018 CLINICAL DATA:  Sudden onset left-sided weakness and altered mental status following dialysis. EXAM: CT ANGIOGRAPHY HEAD AND NECK TECHNIQUE: Multidetector CT imaging of the head and neck was performed using the standard protocol during bolus administration of intravenous contrast. Multiplanar CT image reconstructions and MIPs were obtained to evaluate the vascular anatomy. Carotid stenosis measurements (when applicable) are obtained utilizing NASCET criteria, using the distal internal carotid diameter as the denominator. CONTRAST:  13mL OMNIPAQUE IOHEXOL 300 MG/ML  SOLN COMPARISON:  Head CT 02/27/2018 FINDINGS: CTA NECK FINDINGS SKELETON: There is no bony spinal canal stenosis. No lytic or blastic lesion. OTHER NECK: Normal pharynx, larynx and major salivary glands. No cervical lymphadenopathy. Unremarkable thyroid gland. UPPER CHEST: Mild biapical emphysema AORTIC ARCH: There is moderate calcific atherosclerosis of the aortic arch. There is no aneurysm, dissection or hemodynamically significant stenosis of the visualized ascending aorta  and aortic arch. Normal variant aortic arch branching pattern with the left vertebral artery arising independently from the aortic arch. The visualized proximal subclavian arteries are widely patent. RIGHT CAROTID SYSTEM: --Common carotid artery: Widely patent origin without common carotid artery  dissection or aneurysm. --Internal carotid artery: No dissection, occlusion or aneurysm. Mild atherosclerotic calcification at the carotid bifurcation without hemodynamically significant stenosis. --External carotid artery: No acute abnormality. LEFT CAROTID SYSTEM: --Common carotid artery: Widely patent origin without common carotid artery dissection or aneurysm. --Internal carotid artery: No dissection, occlusion or aneurysm. There is predominantly noncalcified atherosclerosis extending into the proximal ICA, resulting in 50% stenosis. Small area of likely ulcerative plaque within the distal left ICA. --External carotid artery: No acute abnormality. VERTEBRAL ARTERIES: Codominant configuration. At least moderate stenosis of the right vertebral artery origin. The remainder of the right vertebral artery is normal. Normal left vertebral artery. CTA HEAD FINDINGS POSTERIOR CIRCULATION: --Basilar artery: Normal. --Posterior cerebral arteries: Both posterior cerebral arteries are partially supplied by the posterior communicating arteries. --Superior cerebellar arteries: Normal. --Inferior cerebellar arteries: Normal anterior and posterior inferior cerebellar arteries. ANTERIOR CIRCULATION: --Intracranial internal carotid arteries: Atherosclerotic calcification of the internal carotid arteries at the skull base without hemodynamically significant stenosis. --Anterior cerebral arteries: Normal. Both A1 segments are present. Patent anterior communicating artery. --Middle cerebral arteries: Normal. --Posterior communicating arteries: Present bilaterally. VENOUS SINUSES: As permitted by contrast timing, patent. ANATOMIC VARIANTS: None DELAYED PHASE: No parenchymal contrast enhancement. Review of the MIP images confirms the above findings. IMPRESSION: 1. No emergent large vessel occlusion or high-grade intracranial stenosis. 2. Approximately 50% stenosis of the proximal left internal carotid artery. 3. At least moderate  stenosis of the right vertebral artery origin. Aortic Atherosclerosis (ICD10-I70.0) and Emphysema (ICD10-J43.9). Electronically Signed   By: Ulyses Jarred M.D.   On: 02/27/2018 18:04    Ct Head Wo Contrast Result Date: 03/02/2018 CLINICAL DATA:  73 year old male with New left upper extremity weakness. Scattered tiny watershed infarcts suspected in both hemispheres on recent brain MRI. EXAM: CT HEAD WITHOUT CONTRAST TECHNIQUE: Contiguous axial images were obtained from the base of the skull through the vertex without intravenous contrast. COMPARISON:  Brain MRI and head CT 02/27/2018 FINDINGS: Brain: Loss of sulci and gray-white matter differentiation in the right superior frontal lobe most apparent on coronal image 40. No regional mass effect. Elsewhere in the right MCA territory gray-white matter differentiation appears stable and preserved. No definite acute intracranial hemorrhage. In the left hemisphere and posterior fossa gray-white matter differentiation appears stable. Ventricle size remains normal. Vascular: Calcified atherosclerosis at the skull base. Questionable new hyperdensity of the right M1 is new compared to 02/27/2018 on series 3, image 12. However, there seems to be generalized increased vascular density when compared to the prior CT. Skull: Stable.  Chronic right lamina papyracea fracture. Sinuses/Orbits: Visualized paranasal sinuses and mastoids are stable and well pneumatized. Other: No acute orbit or scalp soft tissue findings. IMPRESSION: 1. Loss of right superior frontal lobe sulci and gray-white matter, most suspicious for cytotoxic edema such as due to new infarct. No mass effect. No definite acute hemorrhage. 2. Seemingly generalized increased density of vascular structures compared to the earlier CT. No recent IV contrast administration is known. Perhaps this might reflect dehydration. Study discussed by telephone with Dr. Amie Portland on 03/02/2018 at 03:53 . Electronically Signed    By: Genevie Ann M.D.   On: 03/02/2018 03:54    [name] Result Date: 03/02/2018 CLINICAL DATA:  Acute onset  left arm weakness. Right frontal edema by CT. EXAM: MRI HEAD WITHOUT CONTRAST TECHNIQUE: Multiplanar, multiecho pulse sequences of the brain and surrounding structures were obtained without intravenous contrast. COMPARISON:  CT earlier same day.  MRI 02/27/2018. FINDINGS: Brain: No appreciable change since the study of 3 days ago. Acute/subacute infarctions affecting the cortical brain at the vertices, right more than left, most consistent with watershed infarctions. These do not appear progressive. There is only mild/minimal brain swelling in the right frontal region. No evidence of mass, hemorrhage, hydrocephalus or extra-axial collection. Vascular: Major vessels at the base of the brain show flow. Skull and upper cervical spine: Negative Sinuses/Orbits: Clear/normal Other: None IMPRESSION: No change since the study of 3 days ago. 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 No new area of involvement. No significant increase in swelling. No sign of hemorrhage. I should note that in review the CT angiogram of 02/27/2018, I think the stenosis at the distal bulb on the left is 70% or greater. I think there is very severe stenosis in the left carotid siphon, likely flow limiting. Moderate stenosis in the right carotid siphon. Electronically Signed   By: Nelson Chimes M.D.   On: 03/02/2018 10:11     [name] Result Date: 02/28/2018 CLINICAL DATA:  Initial evaluation for acute left-sided weakness. EXAM: MRI HEAD WITHOUT CONTRAST TECHNIQUE: Multiplanar, multiecho pulse sequences of the brain and surrounding structures were obtained without intravenous contrast. COMPARISON:  Prior CT and CTA from earlier same day. FINDINGS: Brain: Generalized age-related cerebral atrophy. Mild T2/FLAIR hyperintensity within the  periventricular and deep white matter both cerebral hemispheres, nonspecific, but most like related chronic microvascular ischemic disease, minimal for age. Few scattered small remote bilateral cerebellar infarcts noted. Subtle scattered diffusion abnormality seen involving the parasagittal cortical gray matter of the high right frontoparietal region (series 5, images 87, 84, 83). Additional subtle diffusion abnormality seen involving the parasagittal left frontal parietal region, left cingulate (series 5, image 81). Small focus of diffusion abnormality at the anterior left frontal lobe (series 5, image 80). Finding compatible with small acute ischemic infarcts, ACA and/or ACA/MCA distributions. No associated hemorrhage or mass effect. Gray-white matter differentiation otherwise maintained. No other evidence for remote cortical infarction. No foci of susceptibility artifact to suggest acute or chronic intracranial hemorrhage. No mass lesion, midline shift or mass effect. No hydrocephalus. No extra-axial fluid collection. Pituitary gland within normal limits. Vascular: Major intracranial vascular flow voids maintained at the skull base. Diminutive vertebrobasilar system noted. Skull and upper cervical spine: Craniocervical junction normal. Upper cervical spine within normal limits. Bone marrow signal intensity normal. No scalp soft tissue abnormality. Sinuses/Orbits: Globes and orbital soft tissues within normal limits. Mild scattered mucosal thickening within the ethmoidal air cells. Paranasal sinuses are otherwise largely clear. Trace right mastoid effusion, of doubtful significance. Inner ear structures grossly normal. Other: None. IMPRESSION: 1. Scattered small volume diffusion abnormality involving the parasagittal cerebral hemispheres bilaterally near the vertex, right greater than left, with additional mild left frontal cortical involvement as above, compatible with acute ischemia. Changes are largely ACA  distribution, with some MCA and/or ACA-MCA watershed distribution. 2. Few small remote bilateral cerebellar infarcts. 3. Underlying age-related cerebral atrophy with mild chronic microvascular ischemic disease. Electronically Signed   By: Jeannine Boga M.D.   On: 02/28/2018 01:02     Ct Head Code Stroke Wo Contrast Result Date: 02/27/2018 CLINICAL DATA:  Code stroke.  73 y/o  M; left-sided  weakness. EXAM: CT HEAD WITHOUT CONTRAST TECHNIQUE: Contiguous axial images were obtained from the base of the skull through the vertex without intravenous contrast. COMPARISON:  None. FINDINGS: Brain: No evidence of acute infarction, hemorrhage, hydrocephalus, extra-axial collection or mass lesion/mass effect. Nonspecific white matter hypodensities compatible with chronic microvascular ischemic changes volume loss of the brain. Vascular: Density within right M2 superior division may represent thrombus. Skull: Normal. Negative for fracture or focal lesion. Sinuses/Orbits: No acute finding. Other: None. ASPECTS [facility] Stroke Program Early CT Score) - Ganglionic level infarction (caudate, lentiform nuclei, internal capsule, insula, M1-M3 cortex): 7 - Supraganglionic infarction (M4-M6 cortex): 3 Total score (0-10 with 10 being normal): 10 IMPRESSION: 1. No acute intracranial abnormality identified. 2. Density within right M2 superior division may represent thrombus. 3. Mild chronic microvascular ischemic changes and volume loss of brain. 4. ASPECTS is 10 These results were called by telephone at the time of interpretation on 02/27/2018 at 4:48 pm to Dr. Lorraine Lax, who verbally acknowledged these results. Electronically Signed   By: Kristine Garbe M.D.   On: 02/27/2018 16:49    US Abdomen Limited Ruq Result Date: 02/28/2018 CLINICAL DATA:  28-year-old with elevated liver function studies. EXAM: ULTRASOUND ABDOMEN LIMITED RIGHT UPPER QUADRANT COMPARISON:  None. FINDINGS: Gallbladder: The gallbladder is mildly  distended. There is a 3 mm gallbladder polyp. No gallstones, gallbladder wall thickening or sonographic Murphy's sign. Common bile duct: Diameter: 4 mm Liver: No focal lesion identified. Within normal limits in parenchymal echogenicity. Portal vein is patent on color Doppler imaging with normal direction of blood flow towards the liver. Incidental imaging of the right kidney demonstrates a small cyst in the lower pole measuring 8 mm maximally as well as a complex adjacent echogenic lesion measuring 12 x 10 x 10 mm. This demonstrates no gross blood flow on this limited examination. Echogenic shadowing foci are present in the mid right kidney consistent with calculi. IMPRESSION: 1. No evidence of gallstones, biliary dilatation or cholecystitis. Tiny gallbladder polyp. 2. The liver appears unremarkable. 3. Indeterminate small mass in the lower pole of the right kidney. This could reflect a complex cyst or solid lesion. Further evaluation recommended. Given the patient's renal insufficiency, noncontrast renal MRI is likely the best available imaging option. Electronically Signed   By: Richardean Sale M.D.   On: 02/28/2018 09:01    12-lead ECG SR All prior EKG's in EPIC reviewed with no documented atrial fibrillation  Telemetry SR, occ PACs  Assessment and Plan:  1. Cryptogenic stroke The patient presents with cryptogenic stroke.  The patient has a TEE planned for this today.  I spoke at length with the patient about monitoring for afib with either a 30 day event monitor or an implantable loop recorder.  Risks, benefits, and alteratives to implantable loop recorder were discussed with the patient today.   Given his known ischemic CM dating back to Nov 2019 by notes (on BB/ARB, diuretic therapy out patient) and actively following with a cardiologist, Dr. Jeneen Rinks McGukin at [facility], would recommend he have follow up with Dr. Beatrix Fetters for consideration of an ICD implant now 3 months after his last echo without  improvement in his EF, once able to from his stroke perspective and decision made regarding AV Fistula (and HD cath removal) vs resuming peritoneal dialysis.  With event monitoring in the meantime.   I have discussed this with the patient.     Dr. Lovena Le will see later today   Baldwin Jamaica, PA-C 03/02/2018  EP attending  Patient seen and examined.  Agree with the findings as noted above.  The patient presents today with a cryptogenic stroke.  He has a history of end-stage renal disease on dialysis, and also has a history of worsening LV dysfunction.  Previously, he was maintained on peritoneal dialysis but is recently switched to hemodialysis.  The patient overall is not particularly happy with his ongoing hemodialysis.  He thinks he would like to go back to peritoneal dialysis.  The patient had a dense left hemiparesis which has improved.  He has no history of atrial fibrillation.  He is on beta-blocker therapy as well as ARB therapy for his heart failure which is been chronic.  His ejection fraction is 30%.  The patient's ongoing dialysis makes treatment more difficult secondary to the marked increased risk of infection.  That he has an indwelling catheter in his right jugular vein would make this even more problematic.  I have discussed the treatment options including insertion of an implantable loop recorder as well as waiting 3 months and placing an implantable cardioverter defibrillator.  He is reflecting on these options and others.  Crissie Sickles, MD

## 2018-03-02 NOTE — Progress Notes (Signed)
    [facility] has been requested to perform a transesophageal echocardiogram on 03/03/2018 for stroke.  After careful review of history and examination, the risks and benefits of transesophageal echocardiogram have been explained including risks of esophageal damage, perforation (1:10,000 risk), bleeding, pharyngeal hematoma as well as other potential complications associated with conscious sedation including aspiration, arrhythmia, respiratory failure and death. Alternatives to treatment were discussed, questions were answered. Patient is willing to proceed.   Reino Bellis, NP-C 03/02/2018 3:35 PM

## 2018-03-02 NOTE — Consult Note (Addendum)
ELECTROPHYSIOLOGY CONSULT NOTE  Patient ID: Peter [name] MRN: [id number], DOB/AGE: August 19, 1945   Admit date: 02/27/2018 Date of Consult: 03/02/2018  Primary Physician: Patient, No Pcp Per Primary Cardiologist: Dr. Beatrix Fetters Reason for Consultation: Cryptogenic stroke ; recommendations regarding Implantable Loop Recorder, requested by Dr. Cathlean Sauer  History of Present Illness Gevork Ayyad was admitted on 02/27/2018 with a stroke   PMHx of CAD (PCI feb 2018), ICM (LVEF 30-35% by echo Nov 2019 by last cards note after a in-hospital PEA arrest felt to have been 2/2 metabolic acidosis and respiratory causes), HTN, ESRF on HD (also has a peritoneal catheter in place), HLD, chronic CHF (systolic), DM, and PVD Neurology notes Multiple infarcts - embolic - unknown source..  he has undergone workup for stroke including echocardiogram and carotid angio.  The patient has been monitored on telemetry which has demonstrated sinus rhythm with no arrhythmias.  Inpatient stroke work-up is to be completed with a TEE.   Echocardiogram this admission demonstrated  IMPRESSIONS  1. The left ventricle has moderate-severely reduced systolic function, with an ejection fraction of 30-35%. The cavity size was mild to moderately dilated. There is moderate concentric left ventricular hypertrophy. Left ventricular diastolic Doppler  parameters are consistent with impaired relaxation Left ventricular diffuse hypokinesis.  2. The right ventricle has normal systolic function. The cavity was normal. There is no increase in right ventricular wall thickness.  3. Left atrial size was moderately dilated.  4. The mitral valve is normal in structure. Moderate thickening of the mitral valve leaflet. Moderate calcification of the anterior and posterior mitral valve leaflets. There is moderate mitral annular calcification present. Mild mitral valve stenosis.  5. The tricuspid valve is normal in structure.  6. The aortic valve is tricuspid  Moderate thickening of the aortic valve Moderate calcification of the aortic valve. Aortic valve regurgitation is moderate by color flow Doppler. mild stenosis of the aortic valve.  7. Right atrial pressure is estimated at 3 mmHg.  Lab work is reviewed.   Prior to admission, the patient denies chest pain, shortness of breath, dizziness, palpitations, or syncope. There is mention of unclear history of AFib though the patient does not recall this definitively, and I do not see any clear documentation of this. He is recovering from his stroke with plans to CIR at discharge.   Past Medical History:  Diagnosis Date  . Anemia   . CAD (coronary artery disease)   . CHF (congestive heart failure) ([place])   . Diabetes ([place])   . ESRD (end stage renal disease) on dialysis ([place])   . Headache   . HTN (hypertension)   . PEA (Pulseless electrical activity) ([place]) 11/2017  . Traumatic brain injury [facility])      Surgical History: History reviewed. No pertinent surgical history.   Medications Prior to Admission  Medication Sig Dispense Refill Last Dose  . acetaminophen (TYLENOL) 500 MG tablet Take 1,500 mg by mouth daily as needed (pain).    week ago  . amLODipine (NORVASC) 5 MG tablet Take 5 mg by mouth at bedtime.   02/26/2018 at pm  . aspirin EC 325 MG tablet Take 325 mg by mouth daily.   02/26/2018 at am  . atorvastatin (LIPITOR) 40 MG tablet Take 40 mg by mouth daily at 6 PM.   02/26/2018 at pm  . Calcium Carbonate Antacid (TUMS PO) Take 1-3 mg by mouth See admin instructions. Take 3 tablets by mouth up to three times daily with full meals, take 1 tablet  with snacks/small meals   02/26/2018 at pm  . famotidine (PEPCID) 20 MG tablet Take 20 mg by mouth at bedtime.   02/26/2018 at pm  . furosemide (LASIX) 40 MG tablet Take 40 mg by mouth 2 (two) times daily.   02/26/2018 at pm  . isosorbide mononitrate (IMDUR) 60 MG 24 hr tablet Take 60 mg by mouth daily.   02/26/2018 at am  . losartan (COZAAR) 100 MG tablet  Take 100 mg by mouth daily.   02/26/2018 at am  . metoprolol (TOPROL-XL) 200 MG 24 hr tablet Take 200 mg by mouth daily.   02/26/2018 at 1000  . multivitamin (RENA-VIT) TABS tablet Take 1 tablet by mouth daily.   02/26/2018 at am  . nitroGLYCERIN (NITROSTAT) 0.4 MG SL tablet Place 0.4 mg under the tongue every 5 (five) minutes as needed for chest pain.    never taken  . polyvinyl alcohol (ARTIFICIAL TEARS) 1.4 % ophthalmic solution Place 1 drop into both eyes daily as needed for dry eyes.   1-2 weeks ago    Inpatient Medications:  . amLODipine  5 mg Oral QHS  . aspirin EC  325 mg Oral Daily  . atorvastatin  40 mg Oral q1800  . clopidogrel  75 mg Oral Daily  . famotidine  20 mg Oral QHS  . fluticasone  2 spray Each Nare BID  . heparin  5,000 Units Subcutaneous Q8H  . metoprolol succinate  200 mg Oral Daily  . multivitamin  1 tablet Oral Daily    Allergies:  Allergies  Allergen Reactions  . Metformin And Related Other (See Comments)    Contraindicated due to renal failure.  Renal failure-on peritoneal dialysis    . Pioglitazone Other (See Comments)    History of congestive heart failure.    . Propoxyphene Rash    Social History   Socioeconomic History  . Marital status: Single    Spouse name: Not on file  . Number of children: Not on file  . Years of education: Not on file  . Highest education level: Not on file  Occupational History  . Not on file  Social Needs  . Financial resource strain: Not on file  . Food insecurity:    Worry: Not on file    Inability: Not on file  . Transportation needs:    Medical: Not on file    Non-medical: Not on file  Tobacco Use  . Smoking status: Former Smoker  Substance and Sexual Activity  . Alcohol use: Never    Frequency: Never  . Drug use: Never  . Sexual activity: Not Currently  Lifestyle  . Physical activity:    Days per week: Not on file    Minutes per session: Not on file  . Stress: Not on file  Relationships  . Social  connections:    Talks on phone: Not on file    Gets together: Not on file    Attends religious service: Not on file    Active member of club or organization: Not on file    Attends meetings of clubs or organizations: Not on file    Relationship status: Not on file  . Intimate partner violence:    Fear of current or ex partner: Not on file    Emotionally abused: Not on file    Physically abused: Not on file    Forced sexual activity: Not on file  Other Topics Concern  . Not on file  Social History Narrative  .  Not on file     Family: Mother had HTN, father had an aortic aneurysm   Review of Systems: All other systems reviewed and are otherwise negative except as noted above.  Physical Exam: Vitals:   03/01/18 1959 03/02/18 0250 03/02/18 0408 03/02/18 1136  BP:  (!) 183/70 (!) 167/74 (!) 154/64  Pulse: 66 69 66 63  Resp:  18 18 18   Temp: 98.3 F (36.8 C) 98 F (36.7 C)  97.7 F (36.5 C)  TempSrc: Oral Oral  Oral  SpO2:  100% 100%   Weight:      Height:        GEN- The patient is well appearing, alert and oriented x 3 today.   Head- normocephalic, atraumatic Eyes-  Sclera clear, conjunctiva pink Ears- hearing intact Oropharynx- clear Neck- supple Lungs- CTA, normal work of breathing Heart- RRR, no murmurs, rubs or gallops  GI- soft, NT, ND Extremities- no clubbing, cyanosis, or edema MS- no significant deformity or atrophy Skin- no rash or lesion Psych- euthymic mood, full affect   Labs:   Lab Results  Component Value Date   WBC 9.6 03/02/2018   HGB 11.2 (L) 03/02/2018   HCT 34.7 (L) 03/02/2018   MCV 89.4 03/02/2018   PLT 193 03/02/2018    Recent Labs  Lab 02/27/18 1658  03/02/18 0547  NA 139  --  136  K 4.1  --  5.2*  CL 101  --  99  CO2 24  --  22  BUN 31*  --  80*  CREATININE 8.45*   < > 14.97*  CALCIUM 9.3  --  8.3*  PROT 8.9*  --   --   BILITOT 1.5*  --   --   ALKPHOS 141*  --   --   ALT 42  --   --   AST 28  --   --   GLUCOSE 134*   --  89   < > = values in this interval not displayed.   Lab Results  Component Value Date   TROPONINI 0.22 (HH) 02/28/2018   Lab Results  Component Value Date   CHOL 106 02/28/2018   Lab Results  Component Value Date   HDL 36 (L) 02/28/2018   Lab Results  Component Value Date   LDLCALC 48 02/28/2018   Lab Results  Component Value Date   TRIG 108 02/28/2018   Lab Results  Component Value Date   CHOLHDL 2.9 02/28/2018   No results found for: LDLDIRECT  No results found for: DDIMER   Radiology/Studies:   Ct Angio Head W Or Wo Contrast Result Date: 02/27/2018 CLINICAL DATA:  Sudden onset left-sided weakness and altered mental status following dialysis. EXAM: CT ANGIOGRAPHY HEAD AND NECK TECHNIQUE: Multidetector CT imaging of the head and neck was performed using the standard protocol during bolus administration of intravenous contrast. Multiplanar CT image reconstructions and MIPs were obtained to evaluate the vascular anatomy. Carotid stenosis measurements (when applicable) are obtained utilizing NASCET criteria, using the distal internal carotid diameter as the denominator. CONTRAST:  130mL OMNIPAQUE IOHEXOL 300 MG/ML  SOLN COMPARISON:  Head CT 02/27/2018 FINDINGS: CTA NECK FINDINGS SKELETON: There is no bony spinal canal stenosis. No lytic or blastic lesion. OTHER NECK: Normal pharynx, larynx and major salivary glands. No cervical lymphadenopathy. Unremarkable thyroid gland. UPPER CHEST: Mild biapical emphysema AORTIC ARCH: There is moderate calcific atherosclerosis of the aortic arch. There is no aneurysm, dissection or hemodynamically significant stenosis of the visualized ascending aorta  and aortic arch. Normal variant aortic arch branching pattern with the left vertebral artery arising independently from the aortic arch. The visualized proximal subclavian arteries are widely patent. RIGHT CAROTID SYSTEM: --Common carotid artery: Widely patent origin without common carotid artery  dissection or aneurysm. --Internal carotid artery: No dissection, occlusion or aneurysm. Mild atherosclerotic calcification at the carotid bifurcation without hemodynamically significant stenosis. --External carotid artery: No acute abnormality. LEFT CAROTID SYSTEM: --Common carotid artery: Widely patent origin without common carotid artery dissection or aneurysm. --Internal carotid artery: No dissection, occlusion or aneurysm. There is predominantly noncalcified atherosclerosis extending into the proximal ICA, resulting in 50% stenosis. Small area of likely ulcerative plaque within the distal left ICA. --External carotid artery: No acute abnormality. VERTEBRAL ARTERIES: Codominant configuration. At least moderate stenosis of the right vertebral artery origin. The remainder of the right vertebral artery is normal. Normal left vertebral artery. CTA HEAD FINDINGS POSTERIOR CIRCULATION: --Basilar artery: Normal. --Posterior cerebral arteries: Both posterior cerebral arteries are partially supplied by the posterior communicating arteries. --Superior cerebellar arteries: Normal. --Inferior cerebellar arteries: Normal anterior and posterior inferior cerebellar arteries. ANTERIOR CIRCULATION: --Intracranial internal carotid arteries: Atherosclerotic calcification of the internal carotid arteries at the skull base without hemodynamically significant stenosis. --Anterior cerebral arteries: Normal. Both A1 segments are present. Patent anterior communicating artery. --Middle cerebral arteries: Normal. --Posterior communicating arteries: Present bilaterally. VENOUS SINUSES: As permitted by contrast timing, patent. ANATOMIC VARIANTS: None DELAYED PHASE: No parenchymal contrast enhancement. Review of the MIP images confirms the above findings. IMPRESSION: 1. No emergent large vessel occlusion or high-grade intracranial stenosis. 2. Approximately 50% stenosis of the proximal left internal carotid artery. 3. At least moderate  stenosis of the right vertebral artery origin. Aortic Atherosclerosis (ICD10-I70.0) and Emphysema (ICD10-J43.9). Electronically Signed   By: Ulyses Jarred M.D.   On: 02/27/2018 18:04    Ct Head Wo Contrast Result Date: 03/02/2018 CLINICAL DATA:  73 year old male with New left upper extremity weakness. Scattered tiny watershed infarcts suspected in both hemispheres on recent brain MRI. EXAM: CT HEAD WITHOUT CONTRAST TECHNIQUE: Contiguous axial images were obtained from the base of the skull through the vertex without intravenous contrast. COMPARISON:  Brain MRI and head CT 02/27/2018 FINDINGS: Brain: Loss of sulci and gray-white matter differentiation in the right superior frontal lobe most apparent on coronal image 40. No regional mass effect. Elsewhere in the right MCA territory gray-white matter differentiation appears stable and preserved. No definite acute intracranial hemorrhage. In the left hemisphere and posterior fossa gray-white matter differentiation appears stable. Ventricle size remains normal. Vascular: Calcified atherosclerosis at the skull base. Questionable new hyperdensity of the right M1 is new compared to 02/27/2018 on series 3, image 12. However, there seems to be generalized increased vascular density when compared to the prior CT. Skull: Stable.  Chronic right lamina papyracea fracture. Sinuses/Orbits: Visualized paranasal sinuses and mastoids are stable and well pneumatized. Other: No acute orbit or scalp soft tissue findings. IMPRESSION: 1. Loss of right superior frontal lobe sulci and gray-white matter, most suspicious for cytotoxic edema such as due to new infarct. No mass effect. No definite acute hemorrhage. 2. Seemingly generalized increased density of vascular structures compared to the earlier CT. No recent IV contrast administration is known. Perhaps this might reflect dehydration. Study discussed by telephone with Dr. Amie Portland on 03/02/2018 at 03:53 . Electronically Signed    By: Genevie Ann M.D.   On: 03/02/2018 03:54    [name] Result Date: 03/02/2018 CLINICAL DATA:  Acute onset  left arm weakness. Right frontal edema by CT. EXAM: MRI HEAD WITHOUT CONTRAST TECHNIQUE: Multiplanar, multiecho pulse sequences of the brain and surrounding structures were obtained without intravenous contrast. COMPARISON:  CT earlier same day.  MRI 02/27/2018. FINDINGS: Brain: No appreciable change since the study of 3 days ago. Acute/subacute infarctions affecting the cortical brain at the vertices, right more than left, most consistent with watershed infarctions. These do not appear progressive. There is only mild/minimal brain swelling in the right frontal region. No evidence of mass, hemorrhage, hydrocephalus or extra-axial collection. Vascular: Major vessels at the base of the brain show flow. Skull and upper cervical spine: Negative Sinuses/Orbits: Clear/normal Other: None IMPRESSION: No change since the study of 3 days ago. 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 No new area of involvement. No significant increase in swelling. No sign of hemorrhage. I should note that in review the CT angiogram of 02/27/2018, I think the stenosis at the distal bulb on the left is 70% or greater. I think there is very severe stenosis in the left carotid siphon, likely flow limiting. Moderate stenosis in the right carotid siphon. Electronically Signed   By: Nelson Chimes M.D.   On: 03/02/2018 10:11     [name] Result Date: 02/28/2018 CLINICAL DATA:  Initial evaluation for acute left-sided weakness. EXAM: MRI HEAD WITHOUT CONTRAST TECHNIQUE: Multiplanar, multiecho pulse sequences of the brain and surrounding structures were obtained without intravenous contrast. COMPARISON:  Prior CT and CTA from earlier same day. FINDINGS: Brain: Generalized age-related cerebral atrophy. Mild T2/FLAIR hyperintensity within the  periventricular and deep white matter both cerebral hemispheres, nonspecific, but most like related chronic microvascular ischemic disease, minimal for age. Few scattered small remote bilateral cerebellar infarcts noted. Subtle scattered diffusion abnormality seen involving the parasagittal cortical gray matter of the high right frontoparietal region (series 5, images 87, 84, 83). Additional subtle diffusion abnormality seen involving the parasagittal left frontal parietal region, left cingulate (series 5, image 81). Small focus of diffusion abnormality at the anterior left frontal lobe (series 5, image 80). Finding compatible with small acute ischemic infarcts, ACA and/or ACA/MCA distributions. No associated hemorrhage or mass effect. Gray-white matter differentiation otherwise maintained. No other evidence for remote cortical infarction. No foci of susceptibility artifact to suggest acute or chronic intracranial hemorrhage. No mass lesion, midline shift or mass effect. No hydrocephalus. No extra-axial fluid collection. Pituitary gland within normal limits. Vascular: Major intracranial vascular flow voids maintained at the skull base. Diminutive vertebrobasilar system noted. Skull and upper cervical spine: Craniocervical junction normal. Upper cervical spine within normal limits. Bone marrow signal intensity normal. No scalp soft tissue abnormality. Sinuses/Orbits: Globes and orbital soft tissues within normal limits. Mild scattered mucosal thickening within the ethmoidal air cells. Paranasal sinuses are otherwise largely clear. Trace right mastoid effusion, of doubtful significance. Inner ear structures grossly normal. Other: None. IMPRESSION: 1. Scattered small volume diffusion abnormality involving the parasagittal cerebral hemispheres bilaterally near the vertex, right greater than left, with additional mild left frontal cortical involvement as above, compatible with acute ischemia. Changes are largely ACA  distribution, with some MCA and/or ACA-MCA watershed distribution. 2. Few small remote bilateral cerebellar infarcts. 3. Underlying age-related cerebral atrophy with mild chronic microvascular ischemic disease. Electronically Signed   By: Jeannine Boga M.D.   On: 02/28/2018 01:02     Ct Head Code Stroke Wo Contrast Result Date: 02/27/2018 CLINICAL DATA:  Code stroke.  73 y/o  M; left-sided  weakness. EXAM: CT HEAD WITHOUT CONTRAST TECHNIQUE: Contiguous axial images were obtained from the base of the skull through the vertex without intravenous contrast. COMPARISON:  None. FINDINGS: Brain: No evidence of acute infarction, hemorrhage, hydrocephalus, extra-axial collection or mass lesion/mass effect. Nonspecific white matter hypodensities compatible with chronic microvascular ischemic changes volume loss of the brain. Vascular: Density within right M2 superior division may represent thrombus. Skull: Normal. Negative for fracture or focal lesion. Sinuses/Orbits: No acute finding. Other: None. ASPECTS [facility] Stroke Program Early CT Score) - Ganglionic level infarction (caudate, lentiform nuclei, internal capsule, insula, M1-M3 cortex): 7 - Supraganglionic infarction (M4-M6 cortex): 3 Total score (0-10 with 10 being normal): 10 IMPRESSION: 1. No acute intracranial abnormality identified. 2. Density within right M2 superior division may represent thrombus. 3. Mild chronic microvascular ischemic changes and volume loss of brain. 4. ASPECTS is 10 These results were called by telephone at the time of interpretation on 02/27/2018 at 4:48 pm to Dr. Lorraine Lax, who verbally acknowledged these results. Electronically Signed   By: Kristine Garbe M.D.   On: 02/27/2018 16:49    US Abdomen Limited Ruq Result Date: 02/28/2018 CLINICAL DATA:  44-year-old with elevated liver function studies. EXAM: ULTRASOUND ABDOMEN LIMITED RIGHT UPPER QUADRANT COMPARISON:  None. FINDINGS: Gallbladder: The gallbladder is mildly  distended. There is a 3 mm gallbladder polyp. No gallstones, gallbladder wall thickening or sonographic Murphy's sign. Common bile duct: Diameter: 4 mm Liver: No focal lesion identified. Within normal limits in parenchymal echogenicity. Portal vein is patent on color Doppler imaging with normal direction of blood flow towards the liver. Incidental imaging of the right kidney demonstrates a small cyst in the lower pole measuring 8 mm maximally as well as a complex adjacent echogenic lesion measuring 12 x 10 x 10 mm. This demonstrates no gross blood flow on this limited examination. Echogenic shadowing foci are present in the mid right kidney consistent with calculi. IMPRESSION: 1. No evidence of gallstones, biliary dilatation or cholecystitis. Tiny gallbladder polyp. 2. The liver appears unremarkable. 3. Indeterminate small mass in the lower pole of the right kidney. This could reflect a complex cyst or solid lesion. Further evaluation recommended. Given the patient's renal insufficiency, noncontrast renal MRI is likely the best available imaging option. Electronically Signed   By: Richardean Sale M.D.   On: 02/28/2018 09:01    12-lead ECG SR All prior EKG's in EPIC reviewed with no documented atrial fibrillation  Telemetry SR, occ PACs  Assessment and Plan:  1. Cryptogenic stroke The patient presents with cryptogenic stroke.  The patient has a TEE planned for this today.  I spoke at length with the patient about monitoring for afib with either a 30 day event monitor or an implantable loop recorder.  Risks, benefits, and alteratives to implantable loop recorder were discussed with the patient today.   Given his known ischemic CM dating back to Nov 2019 by notes (on BB/ARB, diuretic therapy out patient) and actively following with a cardiologist, Dr. Jeneen Rinks McG[facility] at [facility], would recommend he have follow up with Dr. Beatrix Fetters for consideration of an ICD implant now 3 months after his last echo without  improvement in his EF, once able to from his stroke perspective and decision made regarding AV Fistula (and HD cath removal) vs resuming peritoneal dialysis.  With event monitoring in the meantime.   I have discussed this with the patient.     Dr. Lovena Le will see later today   Baldwin Jamaica, PA-C 03/02/2018  EP attending  Patient seen and examined.  Agree with the findings as noted above.  The patient presents today with a cryptogenic stroke.  He has a history of end-stage renal disease on dialysis, and also has a history of worsening LV dysfunction.  Previously, he was maintained on peritoneal dialysis but is recently switched to hemodialysis.  The patient overall is not particularly happy with his ongoing hemodialysis.  He thinks he would like to go back to peritoneal dialysis.  The patient had a dense left hemiparesis which has improved.  He has no history of atrial fibrillation.  He is on beta-blocker therapy as well as ARB therapy for his heart failure which is been chronic.  His ejection fraction is 30%.  The patient's ongoing dialysis makes treatment more difficult secondary to the marked increased risk of infection.  That he has an indwelling catheter in his right jugular vein would make this even more problematic.  I have discussed the treatment options including insertion of an implantable loop recorder as well as waiting 3 months and placing an implantable cardioverter defibrillator.  He is reflecting on these options and others.  Crissie Sickles, MD

## 2018-03-02 NOTE — Progress Notes (Signed)
Physical Therapy Treatment Patient Details Name: Peter [name] MRN: [id number] DOB: 01/01/1946 Today's Date: 03/02/2018    History of Present Illness Pt is a 73 y.o. male admitted 02/27/18 with L-sided weakness while at dialysis. MRI shows small volume diffusion abnormality involving parasagittal cerebral hemispheres, additional L frontal cortical involvement compatible with acute ischemia; remote bilat cerebellar infarcts. PMH includes HTN, ESRD on HD.    PT Comments    Patient progressing well towards PT goals. Continues to demonstrate left inattention to body despite cues to use LUE for functional tasks. Difficulty initiating and sequencing movements requiring cues. Demonstrated slow processing. Pt with right gaze preference but able to gaze left without difficulty. Tolerated gait training with Min A for balance/safety and cues for upright posture and to increase step length. Good CIR candidate. Will follow.     Follow Up Recommendations  CIR;Supervision for mobility/OOB     Equipment Recommendations  Other (comment)(defer)    Recommendations for Other Services       Precautions / Restrictions Precautions Precautions: Fall Precaution Comments: Inattention to left side (body and environment) Restrictions Weight Bearing Restrictions: No    Mobility  Bed Mobility Overal bed mobility: Needs Assistance Bed Mobility: Supine to Sit     Supine to sit: Min guard;HOB elevated     General bed mobility comments: Min Guard A for safety. No assist needed.  Transfers Overall transfer level: Needs assistance Equipment used: None Transfers: Sit to/from Stand Sit to Stand: Min assist;Mod assist;From elevated surface         General transfer comment: Assist to power to standing with cues to use RUE to push through surface to stand and lower to sit; stood from EOB x1, from chair x1. Inattention to LUE.  Ambulation/Gait Ambulation/Gait assistance: Min assist Gait Distance (Feet): 40  Feet(x2 bouts) Assistive device: 1 person hand held assist Gait Pattern/deviations: Step-to pattern;Decreased stride length;Trunk flexed;Step-through pattern;Narrow base of support Gait velocity: Decreased Gait velocity interpretation: <1.31 ft/sec, indicative of household ambulator General Gait Details: Slow, unsteady gait with left knee instability and jerking like movement at times; HHA on left initially progressing to no UE support. Cues for upright and to increase step lengths. Difficulty initiating/sequencing steps when stopped. 1 seated rest break.    Stairs             Wheelchair Mobility    Modified Rankin (Stroke Patients Only) Modified Rankin (Stroke Patients Only) Pre-Morbid Rankin Score: No symptoms Modified Rankin: Moderately severe disability     Balance Overall balance assessment: Needs assistance Sitting-balance support: No upper extremity supported;Feet supported Sitting balance-Leahy Scale: Fair     Standing balance support: During functional activity Standing balance-Leahy Scale: Poor Standing balance comment: Requires external support or UE support for standing balance.                             Cognition Arousal/Alertness: Awake/alert Behavior During Therapy: WFL for tasks assessed/performed Overall Cognitive Status: Impaired/Different from baseline Area of Impairment: Attention;Problem solving;Following commands;Awareness                   Current Attention Level: Selective   Following Commands: Follows one step commands with increased time   Awareness: Emergent Problem Solving: Slow processing;Requires verbal cues;Difficulty sequencing General Comments: Increased time to process, sequence and follow multi step commands. Poor awareness of deficits. Knows he had a stroke. Left inattention to body.      Exercises  General Comments        Pertinent Vitals/Pain Pain Assessment: No/denies pain    Home Living                       Prior Function            PT Goals (current goals can now be found in the care plan section) Progress towards PT goals: Progressing toward goals    Frequency    Min 4X/week      PT Plan Current plan remains appropriate    Co-evaluation              AM-PAC PT "6 Clicks" Mobility   Outcome Measure  Help needed turning from your back to your side while in a flat bed without using bedrails?: A Little Help needed moving from lying on your back to sitting on the side of a flat bed without using bedrails?: A Little Help needed moving to and from a bed to a chair (including a wheelchair)?: A Lot Help needed standing up from a chair using your arms (e.g., wheelchair or bedside chair)?: A Lot Help needed to walk in hospital room?: A Little Help needed climbing 3-5 steps with a railing? : A Lot 6 Click Score: 15    End of Session Equipment Utilized During Treatment: Gait belt Activity Tolerance: Patient tolerated treatment well Patient left: in chair;with call bell/phone within reach;with chair alarm set Nurse Communication: Mobility status PT Visit Diagnosis: Other abnormalities of gait and mobility (R26.89);Muscle weakness (generalized) (M62.81)     Time: 8250-5397 PT Time Calculation (min) (ACUTE ONLY): 28 min  Charges:  $Gait Training: 8-22 mins $Therapeutic Activity: 8-22 mins                     Wray Kearns, [place], DPT Acute Rehabilitation Services Pager [phone] Office [phone]       [place] 03/02/2018, 11:24 AM

## 2018-03-02 NOTE — Evaluation (Signed)
Speech Language Pathology Evaluation Patient Details Name: Peter [name] MRN: [id number] DOB: [birth year] Today's Date: 03/02/2018 Time: 1435-1450 SLP Time Calculation (min) (ACUTE ONLY): 15 min  Problem List:  Patient Active Problem List   Diagnosis Date Noted  . Acute CVA (cerebrovascular accident) ([place]) 02/28/2018  . ESRD on dialysis ([place]) 02/28/2018  . Right kidney mass 02/28/2018  . TIA (transient ischemic attack) 02/28/2018  . CAD (coronary artery disease)   . Diabetes ([place])   . CHF (congestive heart failure) ([place])   . Traumatic brain injury ([place])   . Anemia   . Elevated troponin 02/27/2018  . Elevated LFTs 02/27/2018  . HTN (hypertension) 02/27/2018  . PEA (Pulseless electrical activity) ([place]) 11/14/2017   Past Medical History:  Past Medical History:  Diagnosis Date  . Anemia   . CAD (coronary artery disease)   . CHF (congestive heart failure) ([place])   . Diabetes ([place])   . ESRD (end stage renal disease) on dialysis ([place])   . Headache   . HTN (hypertension)   . PEA (Pulseless electrical activity) ([place]) 11/2017  . Traumatic brain injury [facility])    Past Surgical History: History reviewed. No pertinent surgical history. HPI:  Pt is a 73 y.o. male admitted 02/27/18 with L-sided weakness while at dialysis. MRI shows small volume diffusion abnormality involving parasagittal cerebral hemispheres, additional L frontal cortical involvement compatible with acute ischemia; remote bilat cerebellar infarcts. PMH includes HTN, ESRD on HD.   Assessment / Plan / Recommendation Clinical Impression  Patient lethargic upon arrival and required extra time for arousal. Patient answered complex yes/no questions with 90% accuracy and followed multi-step commands WFL. Patient performed verbal fluency tasks, generative naming  tasks, convergent naming tasks and divergent naming tasks without evidence of word-finding deficits. Patient also participated in an informal conversation with Mod I for word  and 100% intelligibility. Deficits in attention noted throughout evaluation and suspect higher-level cognitive deficits present as well but not fully assessed due to lethargy. Patient's language appeared [facility] for all tasks assessed but patient would benefit from skilled SLP intervention for continued diagnostic treatment of higher-level cognitive functioning due to patient living independently prior to admission. Patient and daughter verbalized understanding and agreement.     SLP Assessment  SLP Recommendation/Assessment: Patient needs continued Speech Lanaguage Pathology Services SLP Visit Diagnosis: Cognitive communication deficit (R41.841)    Follow Up Recommendations  Inpatient Rehab    Frequency and Duration min 2x/week  2 weeks      SLP Evaluation Cognition  Overall Cognitive Status: Impaired/Different from baseline Arousal/Alertness: Lethargic Orientation Level: Oriented X4 Attention: Sustained Sustained Attention: Impaired Sustained Attention Impairment: Verbal basic Problem Solving: Impaired Problem Solving Impairment: Functional basic Safety/Judgment: Impaired       Comprehension  Auditory Comprehension Overall Auditory Comprehension: Appears within functional limits for tasks assessed Visual Recognition/Discrimination Discrimination: Not tested Reading Comprehension Reading Status: Not tested    Expression Expression Primary Mode of Expression: Verbal Verbal Expression Overall Verbal Expression: Appears within functional limits for tasks assessed Written Expression Dominant Hand: Right Written Expression: Not tested   Oral / Motor  Oral Motor/Sensory Function Overall Oral Motor/Sensory Function: Within functional limits Motor Speech Overall Motor Speech: Appears within functional limits for tasks assessed   GO                    Anaeli Cornwall 03/02/2018, 2:59 PM  [place], [place], [place]

## 2018-03-02 NOTE — Progress Notes (Signed)
Inpatient Diabetes Program Recommendations  AACE/ADA: New Consensus Statement on Inpatient Glycemic Control (2015)  Target Ranges:  Prepandial:   less than 140 mg/dL      Peak postprandial:   less than 180 mg/dL (1-2 hours)      Critically ill patients:  140 - 180 mg/dL   Results for Peter [name], Peter [name] (MRN [id number]) as of 03/02/2018 14:28  Ref. Range 02/27/2018 16:58 03/02/2018 05:47  Glucose Latest Ref Range: 70 - 99 mg/dL 134 (H) 89   Results for Peter [name], Peter [name] (MRN [id number]) as of 03/02/2018 14:28  Ref. Range 02/28/2018 02:07  Hemoglobin A1C Latest Ref Range: 4.8 - 5.6 % 6.6 (H)    Admit with: Acute CVA  History: DM, ESRD  Home DM Meds: None  Current Orders: None     Per Review of Care Everywhere (see Initial Consult note from Dr. Irwin Brakeman) 02/06/2018, patient with History of DM.  MD- Please place orders for CBG checks TID AC + HS     --Will follow patient during hospitalization--  Wyn Quaker RN, MSN, CDE Diabetes Coordinator Inpatient Glycemic Control Team Team Pager: [phone] (8a-5p)

## 2018-03-02 NOTE — Progress Notes (Signed)
OT Cancellation Note  Patient Details Name: Peter [name] MRN: [id number] DOB: [birth year]   Cancelled Treatment:    Reason Eval/Treat Not Completed: Patient at procedure or test/ unavailable(MRI. Will return as schedule allows. )  [place], OTR/L Acute Rehab Pager: [phone] Office: [phone] 03/02/2018, 8:39 AM

## 2018-03-03 ENCOUNTER — Encounter (HOSPITAL_COMMUNITY)
Admission: EM | Disposition: A | Discharge status: Home Health | Payer: Self-pay | Source: Ambulatory Visit | Attending: Internal Medicine

## 2018-03-03 ENCOUNTER — Inpatient Hospital Stay (HOSPITAL_COMMUNITY): Payer: Medicare HMO

## 2018-03-03 DIAGNOSIS — I639 Cerebral infarction, unspecified: Secondary | ICD-10-CM

## 2018-03-03 LAB — BASIC METABOLIC PANEL
Anion gap: 17 — ABNORMAL HIGH (ref 5–15)
BUN: 95 mg/dL — ABNORMAL HIGH (ref 8–23)
CALCIUM: 8.7 mg/dL — AB (ref 8.9–10.3)
CO2: 20 mmol/L — ABNORMAL LOW (ref 22–32)
Chloride: 98 mmol/L (ref 98–111)
Creatinine, Ser: 16.83 mg/dL — ABNORMAL HIGH (ref 0.61–1.24)
GFR calc Af Amer: 3 mL/min — ABNORMAL LOW (ref >60)
GFR calc non Af Amer: 3 mL/min — ABNORMAL LOW (ref >60)
Glucose, Bld: 84 mg/dL (ref 70–99)
Potassium: 5.3 mmol/L — ABNORMAL HIGH (ref 3.5–5.1)
Sodium: 135 mmol/L (ref 135–145)

## 2018-03-03 LAB — CBC
HCT: 32.6 % — ABNORMAL LOW (ref 39.0–52.0)
Hemoglobin: 10.7 g/dL — ABNORMAL LOW (ref 13.0–17.0)
MCH: 29.4 pg (ref 26.0–34.0)
MCHC: 32.8 g/dL (ref 30.0–36.0)
MCV: 89.6 fL (ref 80.0–100.0)
Platelets: 199 10*3/uL (ref 150–400)
RBC: 3.64 MIL/uL — ABNORMAL LOW (ref 4.22–5.81)
RDW: 16.1 % — AB (ref 11.5–15.5)
WBC: 8.9 10*3/uL (ref 4.0–10.5)
nRBC: 0 % (ref 0.0–0.2)

## 2018-03-03 LAB — HEPATITIS B SURFACE ANTIGEN: Hepatitis B Surface Ag: NEGATIVE

## 2018-03-03 SURGERY — INVASIVE LAB ABORTED CASE

## 2018-03-03 MED ORDER — SODIUM CHLORIDE 0.9 % IV SOLN: 100.0000 mL | INTRAVENOUS | Status: DC | PRN

## 2018-03-03 MED ORDER — SODIUM CHLORIDE 0.9 % IV SOLN
INTRAVENOUS | Status: DC
  Administered 2018-03-03: 06:00:00 via INTRAVENOUS

## 2018-03-03 MED ORDER — HEPARIN SODIUM (PORCINE) 1000 UNIT/ML DIALYSIS
20.0000 [IU]/kg | INTRAMUSCULAR | Status: DC | PRN
  Administered 2018-03-03: 1400 [IU] via INTRAVENOUS_CENTRAL

## 2018-03-03 MED ORDER — PENTAFLUOROPROP-TETRAFLUOROETH EX AERO: 1.0000 "application " | INHALATION_SPRAY | CUTANEOUS | Status: DC | PRN

## 2018-03-03 MED ORDER — LOSARTAN POTASSIUM 50 MG PO TABS
100.0000 mg | ORAL_TABLET | Freq: Every day | ORAL | Status: DC
  Administered 2018-03-03 – 2018-03-29 (×25): 100 mg via ORAL
  Filled 2018-03-03 (×25): qty 2

## 2018-03-03 MED ORDER — MIDAZOLAM HCL (PF) 10 MG/2ML IJ SOLN
INTRAMUSCULAR | Status: DC | PRN
  Administered 2018-03-03 (×2): 1 mg via INTRAVENOUS
  Administered 2018-03-03: 2 mg via INTRAVENOUS
  Administered 2018-03-03: 1 mg via INTRAVENOUS

## 2018-03-03 MED ORDER — HEPARIN SODIUM (PORCINE) 5000 UNIT/ML IJ SOLN
5000.0000 [IU] | Freq: Three times a day (TID) | INTRAMUSCULAR | Status: DC
  Administered 2018-03-04 – 2018-03-05 (×2): 5000 [IU] via SUBCUTANEOUS
  Filled 2018-03-03 (×2): qty 1

## 2018-03-03 MED ORDER — NITROGLYCERIN 0.4 MG SL SUBL
0.4000 mg | SUBLINGUAL_TABLET | SUBLINGUAL | Status: AC | PRN
  Administered 2018-03-03 – 2018-03-05 (×2): 0.4 mg via SUBLINGUAL
  Filled 2018-03-03 (×2): qty 1

## 2018-03-03 MED ORDER — BUTAMBEN-TETRACAINE-BENZOCAINE 2-2-14 % EX AERO
INHALATION_SPRAY | CUTANEOUS | Status: DC | PRN
  Administered 2018-03-03: 2 via TOPICAL

## 2018-03-03 MED ORDER — ALTEPLASE 2 MG IJ SOLR: 2.0000 mg | Freq: Once | INTRAMUSCULAR | Status: DC | PRN

## 2018-03-03 MED ORDER — FENTANYL CITRATE (PF) 100 MCG/2ML IJ SOLN
INTRAMUSCULAR | Status: DC | PRN
  Administered 2018-03-03 (×4): 25 ug via INTRAVENOUS

## 2018-03-03 MED ORDER — ONDANSETRON HCL 4 MG/2ML IJ SOLN
4.0000 mg | Freq: Once | INTRAMUSCULAR | Status: AC
  Administered 2018-03-03: 4 mg via INTRAVENOUS

## 2018-03-03 MED ORDER — HEPARIN SODIUM (PORCINE) 1000 UNIT/ML DIALYSIS
1000.0000 [IU] | INTRAMUSCULAR | Status: DC | PRN
  Administered 2018-03-03: 1000 [IU] via INTRAVENOUS_CENTRAL
  Filled 2018-03-03: qty 1

## 2018-03-03 MED ORDER — ONDANSETRON HCL 4 MG/2ML IJ SOLN
INTRAMUSCULAR | Status: AC
  Administered 2018-03-03: 4 mg via INTRAVENOUS
  Filled 2018-03-03: qty 2

## 2018-03-03 MED ORDER — LIDOCAINE HCL (PF) 1 % IJ SOLN: 5.0000 mL | INTRAMUSCULAR | Status: DC | PRN

## 2018-03-03 MED ORDER — HEPARIN SODIUM (PORCINE) 1000 UNIT/ML IJ SOLN
INTRAMUSCULAR | Status: AC
  Administered 2018-03-03: 1400 [IU] via INTRAVENOUS_CENTRAL
  Filled 2018-03-03: qty 2

## 2018-03-03 MED ORDER — MIDAZOLAM HCL (PF) 5 MG/ML IJ SOLN
INTRAMUSCULAR | Status: AC
  Filled 2018-03-03: qty 2

## 2018-03-03 MED ORDER — ONDANSETRON HCL 4 MG/2ML IJ SOLN: 4.0000 mg | Freq: Once | INTRAMUSCULAR | Status: DC

## 2018-03-03 MED ORDER — LIDOCAINE-PRILOCAINE 2.5-2.5 % EX CREA: 1.0000 "application " | TOPICAL_CREAM | CUTANEOUS | Status: DC | PRN

## 2018-03-03 MED ORDER — HEPARIN SODIUM (PORCINE) 1000 UNIT/ML DIALYSIS: 20.0000 [IU]/kg | INTRAMUSCULAR | Status: DC | PRN

## 2018-03-03 MED ORDER — PROMETHAZINE HCL 25 MG/ML IJ SOLN
12.5000 mg | Freq: Once | INTRAMUSCULAR | Status: AC
  Administered 2018-03-03: 12.5 mg via INTRAVENOUS
  Filled 2018-03-03: qty 1

## 2018-03-03 MED ORDER — FENTANYL CITRATE (PF) 100 MCG/2ML IJ SOLN
INTRAMUSCULAR | Status: AC
  Filled 2018-03-03: qty 2

## 2018-03-03 NOTE — Consult Note (Signed)
Chief Complaint: Patient was seen in consultation today for left ICA stenosis.  Referring Physician(s): Garvin Fila  Supervising Physician: Luanne Bras  Patient Status: Peter [name] - In-pt  History of Present Illness: Peter [name] is a 73 y.o. 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 He presented to [facility] ED 02/27/2018 with complaint of LUE weakness. He was found to have an acute CVA and was admitted for further management.  CT head 02/27/2018: 1. No acute intracranial abnormality identified. 2. Density within right M2 superior division may represent thrombus. 3. Mild chronic microvascular ischemic changes and volume loss of brain. 4. ASPECTS is 10  CTA head/neck 02/27/2018: 1. No emergent large vessel occlusion or high-grade intracranial stenosis. 2. Approximately 50% stenosis of the proximal left internal carotid artery. 3. At least moderate stenosis of the right vertebral artery origin.  MR brain 02/27/2018: 1. Scattered small volume diffusion abnormality involving the parasagittal cerebral hemispheres bilaterally near the vertex, right greater than left, with additional mild left frontal cortical involvement as above, compatible with acute ischemia. Changes are largely ACA distribution, with some MCA and/or ACA-MCA watershed distribution. 2. Few small remote bilateral cerebellar infarcts. 3. Underlying age-related cerebral atrophy with mild chronic microvascular ischemic disease.  CT head 03/02/2018: 1. Loss of right superior frontal lobe sulci and gray-white matter, most suspicious for cytotoxic edema such as due to new infarct. No mass effect. No definite acute hemorrhage. 2. Seemingly generalized increased density of vascular structures compared to the earlier CT. No recent IV contrast administration is known. Perhaps this might reflect dehydration.  MR brain 03/02/2018: 1. No change  since the study of 3 days ago. 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 No new area of involvement. No significant increase in swelling. No sign of hemorrhage. 2. I should note that in review the CT angiogram of 02/27/2018, I think the stenosis at the distal bulb on the left is 70% or greater. I think there is very severe stenosis in the left carotid siphon, likely flow limiting. Moderate stenosis in the right carotid siphon.  IR requested by Dr. Leonie Man for possible image-guided diagnostic cerebral arteriogram. Patient awake and alert sitting in chair with no complaints at this time. Denies fever, chills, chest pain, dyspnea, abdominal pain, dizziness, or headache.  Patient is currently taking Plavix 75 mg once daily and receiving Heparin SQ injections Q8H.   Past Medical History:  Diagnosis Date  . Anemia   . CAD (coronary artery disease)   . CHF (congestive heart failure) ([place])   . Diabetes ([place])   . ESRD (end stage renal disease) on dialysis ([place])   . Headache   . HTN (hypertension)   . PEA (Pulseless electrical activity) ([place]) 11/2017  . Traumatic brain injury [facility])     History reviewed. No pertinent surgical history.  Allergies: Metformin and related; Pioglitazone; and Propoxyphene  Medications: Prior to Admission medications   Medication Sig Start Date End Date Taking? Authorizing Provider  acetaminophen (TYLENOL) 500 MG tablet Take 1,500 mg by mouth daily as needed (pain).    Yes [provider]  amLODipine (NORVASC) 5 MG tablet Take 5 mg by mouth at bedtime. 02/06/18  Yes [provider]  aspirin EC 325 MG tablet Take 325 mg by mouth daily.   Yes [provider]  atorvastatin (LIPITOR) 40 MG tablet Take 40 mg by mouth daily at 6  PM. 12/24/17  Yes [provider]  Calcium Carbonate Antacid (TUMS PO) Take 1-3 mg by mouth See admin instructions. Take 3 tablets by mouth up to  three times daily with full meals, take 1 tablet with snacks/small meals   Yes [provider]  famotidine (PEPCID) 20 MG tablet Take 20 mg by mouth at bedtime. 12/22/17  Yes [provider]  furosemide (LASIX) 40 MG tablet Take 40 mg by mouth 2 (two) times daily. 02/17/18  Yes [provider]  isosorbide mononitrate (IMDUR) 60 MG 24 hr tablet Take 60 mg by mouth daily. 12/25/17  Yes [provider]  losartan (COZAAR) 100 MG tablet Take 100 mg by mouth daily. 02/25/18  Yes [provider]  metoprolol (TOPROL-XL) 200 MG 24 hr tablet Take 200 mg by mouth daily. 10/24/17  Yes [provider]  multivitamin (RENA-VIT) TABS tablet Take 1 tablet by mouth daily.   Yes [provider]  nitroGLYCERIN (NITROSTAT) 0.4 MG SL tablet Place 0.4 mg under the tongue every 5 (five) minutes as needed for chest pain.  12/25/17  Yes [provider]  polyvinyl alcohol (ARTIFICIAL TEARS) 1.4 % ophthalmic solution Place 1 drop into both eyes daily as needed for dry eyes.   Yes [provider]     No family history on file.  Social History   Socioeconomic History  . Marital status: Single    Spouse name: Not on file  . Number of children: Not on file  . Years of education: Not on file  . Highest education level: Not on file  Occupational History  . Not on file  Social Needs  . Financial resource strain: Not on file  . Food insecurity:    Worry: Not on file    Inability: Not on file  . Transportation needs:    Medical: Not on file    Non-medical: Not on file  Tobacco Use  . Smoking status: Former Smoker  Substance and Sexual Activity  . Alcohol use: Never    Frequency: Never  . Drug use: Never  . Sexual activity: Not Currently  Lifestyle  . Physical activity:    Days per week: Not on file    Minutes per session: Not on file  . Stress: Not on file  Relationships  . Social connections:    Talks on phone: Not on file    Gets  together: Not on file    Attends religious service: Not on file    Active member of club or organization: Not on file    Attends meetings of clubs or organizations: Not on file    Relationship status: Not on file  Other Topics Concern  . Not on file  Social History Narrative  . Not on file     Review of Systems: A 12 point ROS discussed and pertinent positives are indicated in the HPI above.  All other systems are negative.  Review of Systems  Constitutional: Negative for chills and fever.  Respiratory: Negative for shortness of breath and wheezing.   Cardiovascular: Negative for chest pain and palpitations.  Gastrointestinal: Negative for abdominal pain.  Neurological: Negative for dizziness and headaches.  Psychiatric/Behavioral: Negative for behavioral problems and confusion.    Vital Signs: BP (!) 188/72   Pulse 68   Temp 98.2 F (36.8 C)   Resp 16   Ht 6\' 3"  (1.905 m)   Wt 155 lb 6.8 oz (70.5 kg)   SpO2 100%   BMI 19.43 kg/m  Physical Exam Vitals signs and nursing note reviewed.  Constitutional:      General: He is not in acute distress.    Appearance: Normal appearance.  Cardiovascular:     Rate and Rhythm: Normal rate and regular rhythm.     Heart sounds: Normal heart sounds. No murmur.  Pulmonary:     Effort: Pulmonary effort is normal. No respiratory distress.     Breath sounds: Normal breath sounds. No wheezing.  Skin:    General: Skin is warm and dry.  Neurological:     Mental Status: He is alert and oriented to person, place, and time.  Psychiatric:        Mood and Affect: Mood normal.        Behavior: Behavior normal.        Thought Content: Thought content normal.        Judgment: Judgment normal.      MD Evaluation Airway: WNL Heart: WNL Abdomen: WNL Chest/ Lungs: WNL ASA  Classification: 3 Mallampati/Airway Score: Two   Imaging: Ct Angio Head W Or Wo Contrast  Result Date: 02/27/2018 CLINICAL DATA:  Sudden onset left-sided  weakness and altered mental status following dialysis. EXAM: CT ANGIOGRAPHY HEAD AND NECK TECHNIQUE: Multidetector CT imaging of the head and neck was performed using the standard protocol during bolus administration of intravenous contrast. Multiplanar CT image reconstructions and MIPs were obtained to evaluate the vascular anatomy. Carotid stenosis measurements (when applicable) are obtained utilizing NASCET criteria, using the distal internal carotid diameter as the denominator. CONTRAST:  133mL OMNIPAQUE IOHEXOL 300 MG/ML  SOLN COMPARISON:  Head CT 02/27/2018 FINDINGS: CTA NECK FINDINGS SKELETON: There is no bony spinal canal stenosis. No lytic or blastic lesion. OTHER NECK: Normal pharynx, larynx and major salivary glands. No cervical lymphadenopathy. Unremarkable thyroid gland. UPPER CHEST: Mild biapical emphysema AORTIC ARCH: There is moderate calcific atherosclerosis of the aortic arch. There is no aneurysm, dissection or hemodynamically significant stenosis of the visualized ascending aorta and aortic arch. Normal variant aortic arch branching pattern with the left vertebral artery arising independently from the aortic arch. The visualized proximal subclavian arteries are widely patent. RIGHT CAROTID SYSTEM: --Common carotid artery: Widely patent origin without common carotid artery dissection or aneurysm. --Internal carotid artery: No dissection, occlusion or aneurysm. Mild atherosclerotic calcification at the carotid bifurcation without hemodynamically significant stenosis. --External carotid artery: No acute abnormality. LEFT CAROTID SYSTEM: --Common carotid artery: Widely patent origin without common carotid artery dissection or aneurysm. --Internal carotid artery: No dissection, occlusion or aneurysm. There is predominantly noncalcified atherosclerosis extending into the proximal ICA, resulting in 50% stenosis. Small area of likely ulcerative plaque within the distal left ICA. --External carotid  artery: No acute abnormality. VERTEBRAL ARTERIES: Codominant configuration. At least moderate stenosis of the right vertebral artery origin. The remainder of the right vertebral artery is normal. Normal left vertebral artery. CTA HEAD FINDINGS POSTERIOR CIRCULATION: --Basilar artery: Normal. --Posterior cerebral arteries: Both posterior cerebral arteries are partially supplied by the posterior communicating arteries. --Superior cerebellar arteries: Normal. --Inferior cerebellar arteries: Normal anterior and posterior inferior cerebellar arteries. ANTERIOR CIRCULATION: --Intracranial internal carotid arteries: Atherosclerotic calcification of the internal carotid arteries at the skull base without hemodynamically significant stenosis. --Anterior cerebral arteries: Normal. Both A1 segments are present. Patent anterior communicating artery. --Middle cerebral arteries: Normal. --Posterior communicating arteries: Present bilaterally. VENOUS SINUSES: As permitted by contrast timing, patent. ANATOMIC VARIANTS: None DELAYED PHASE: No parenchymal contrast enhancement. Review of the MIP images confirms the above findings. IMPRESSION: 1. No  emergent large vessel occlusion or high-grade intracranial stenosis. 2. Approximately 50% stenosis of the proximal left internal carotid artery. 3. At least moderate stenosis of the right vertebral artery origin. Aortic Atherosclerosis (ICD10-I70.0) and Emphysema (ICD10-J43.9). Electronically Signed   By: Ulyses Jarred M.D.   On: 02/27/2018 18:04   Ct Head Wo Contrast  Result Date: 03/02/2018 CLINICAL DATA:  73 year old male with New left upper extremity weakness. Scattered tiny watershed infarcts suspected in both hemispheres on recent brain MRI. EXAM: CT HEAD WITHOUT CONTRAST TECHNIQUE: Contiguous axial images were obtained from the base of the skull through the vertex without intravenous contrast. COMPARISON:  Brain MRI and head CT 02/27/2018 FINDINGS: Brain: Loss of sulci and  gray-white matter differentiation in the right superior frontal lobe most apparent on coronal image 40. No regional mass effect. Elsewhere in the right MCA territory gray-white matter differentiation appears stable and preserved. No definite acute intracranial hemorrhage. In the left hemisphere and posterior fossa gray-white matter differentiation appears stable. Ventricle size remains normal. Vascular: Calcified atherosclerosis at the skull base. Questionable new hyperdensity of the right M1 is new compared to 02/27/2018 on series 3, image 12. However, there seems to be generalized increased vascular density when compared to the prior CT. Skull: Stable.  Chronic right lamina papyracea fracture. Sinuses/Orbits: Visualized paranasal sinuses and mastoids are stable and well pneumatized. Other: No acute orbit or scalp soft tissue findings. IMPRESSION: 1. Loss of right superior frontal lobe sulci and gray-white matter, most suspicious for cytotoxic edema such as due to new infarct. No mass effect. No definite acute hemorrhage. 2. Seemingly generalized increased density of vascular structures compared to the earlier CT. No recent IV contrast administration is known. Perhaps this might reflect dehydration. Study discussed by telephone with Dr. Amie Portland on 03/02/2018 at 03:53 . Electronically Signed   By: Genevie Ann M.D.   On: 03/02/2018 03:54   Ct Angio Neck W Or Wo Contrast  Result Date: 02/27/2018 CLINICAL DATA:  Sudden onset left-sided weakness and altered mental status following dialysis. EXAM: CT ANGIOGRAPHY HEAD AND NECK TECHNIQUE: Multidetector CT imaging of the head and neck was performed using the standard protocol during bolus administration of intravenous contrast. Multiplanar CT image reconstructions and MIPs were obtained to evaluate the vascular anatomy. Carotid stenosis measurements (when applicable) are obtained utilizing NASCET criteria, using the distal internal carotid diameter as the denominator.  CONTRAST:  160mL OMNIPAQUE IOHEXOL 300 MG/ML  SOLN COMPARISON:  Head CT 02/27/2018 FINDINGS: CTA NECK FINDINGS SKELETON: There is no bony spinal canal stenosis. No lytic or blastic lesion. OTHER NECK: Normal pharynx, larynx and major salivary glands. No cervical lymphadenopathy. Unremarkable thyroid gland. UPPER CHEST: Mild biapical emphysema AORTIC ARCH: There is moderate calcific atherosclerosis of the aortic arch. There is no aneurysm, dissection or hemodynamically significant stenosis of the visualized ascending aorta and aortic arch. Normal variant aortic arch branching pattern with the left vertebral artery arising independently from the aortic arch. The visualized proximal subclavian arteries are widely patent. RIGHT CAROTID SYSTEM: --Common carotid artery: Widely patent origin without common carotid artery dissection or aneurysm. --Internal carotid artery: No dissection, occlusion or aneurysm. Mild atherosclerotic calcification at the carotid bifurcation without hemodynamically significant stenosis. --External carotid artery: No acute abnormality. LEFT CAROTID SYSTEM: --Common carotid artery: Widely patent origin without common carotid artery dissection or aneurysm. --Internal carotid artery: No dissection, occlusion or aneurysm. There is predominantly noncalcified atherosclerosis extending into the proximal ICA, resulting in 50% stenosis. Small area of likely ulcerative plaque within the  distal left ICA. --External carotid artery: No acute abnormality. VERTEBRAL ARTERIES: Codominant configuration. At least moderate stenosis of the right vertebral artery origin. The remainder of the right vertebral artery is normal. Normal left vertebral artery. CTA HEAD FINDINGS POSTERIOR CIRCULATION: --Basilar artery: Normal. --Posterior cerebral arteries: Both posterior cerebral arteries are partially supplied by the posterior communicating arteries. --Superior cerebellar arteries: Normal. --Inferior cerebellar arteries:  Normal anterior and posterior inferior cerebellar arteries. ANTERIOR CIRCULATION: --Intracranial internal carotid arteries: Atherosclerotic calcification of the internal carotid arteries at the skull base without hemodynamically significant stenosis. --Anterior cerebral arteries: Normal. Both A1 segments are present. Patent anterior communicating artery. --Middle cerebral arteries: Normal. --Posterior communicating arteries: Present bilaterally. VENOUS SINUSES: As permitted by contrast timing, patent. ANATOMIC VARIANTS: None DELAYED PHASE: No parenchymal contrast enhancement. Review of the MIP images confirms the above findings. IMPRESSION: 1. No emergent large vessel occlusion or high-grade intracranial stenosis. 2. Approximately 50% stenosis of the proximal left internal carotid artery. 3. At least moderate stenosis of the right vertebral artery origin. Aortic Atherosclerosis (ICD10-I70.0) and Emphysema (ICD10-J43.9). Electronically Signed   By: Ulyses Jarred M.D.   On: 02/27/2018 18:04   [name]  Result Date: 03/02/2018 CLINICAL DATA:  Acute onset left arm weakness. Right frontal edema by CT. EXAM: MRI HEAD WITHOUT CONTRAST TECHNIQUE: Multiplanar, multiecho pulse sequences of the brain and surrounding structures were obtained without intravenous contrast. COMPARISON:  CT earlier same day.  MRI 02/27/2018. FINDINGS: Brain: No appreciable change since the study of 3 days ago. Acute/subacute infarctions affecting the cortical brain at the vertices, right more than left, most consistent with watershed infarctions. These do not appear progressive. There is only mild/minimal brain swelling in the right frontal region. No evidence of mass, hemorrhage, hydrocephalus or extra-axial collection. Vascular: Major vessels at the base of the brain show flow. Skull and upper cervical spine: Negative Sinuses/Orbits: Clear/normal Other: None IMPRESSION: No change since the study of 3 days ago. Likely watershed  distribution cortical infarctions at the cerebral hemispheric vertices, right more than left. No new area of involvement. No significant increase in swelling. No sign of hemorrhage. I should note that in review the CT angiogram of 02/27/2018, I think the stenosis at the distal bulb on the left is 70% or greater. I think there is very severe stenosis in the left carotid siphon, likely flow limiting. Moderate stenosis in the right carotid siphon. Electronically Signed   By: Nelson Chimes M.D.   On: 03/02/2018 10:11   [name]  Result Date: 02/28/2018 CLINICAL DATA:  Initial evaluation for acute left-sided weakness. EXAM: MRI HEAD WITHOUT CONTRAST TECHNIQUE: Multiplanar, multiecho pulse sequences of the brain and surrounding structures were obtained without intravenous contrast. COMPARISON:  Prior CT and CTA from earlier same day. FINDINGS: Brain: Generalized age-related cerebral atrophy. Mild T2/FLAIR hyperintensity within the periventricular and deep white matter both cerebral hemispheres, nonspecific, but most like related chronic microvascular ischemic disease, minimal for age. Few scattered small remote bilateral cerebellar infarcts noted. Subtle scattered diffusion abnormality seen involving the parasagittal cortical gray matter of the high right frontoparietal region (series 5, images 87, 84, 83). Additional subtle diffusion abnormality seen involving the parasagittal left frontal parietal region, left cingulate (series 5, image 81). Small focus of diffusion abnormality at the anterior left frontal lobe (series 5, image 80). Finding compatible with small acute ischemic infarcts, ACA and/or ACA/MCA distributions. No associated hemorrhage or mass effect. Gray-white matter differentiation otherwise maintained. No other evidence for remote cortical infarction.  No foci of susceptibility artifact to suggest acute or chronic intracranial hemorrhage. No mass lesion, midline shift or mass effect. No  hydrocephalus. No extra-axial fluid collection. Pituitary gland within normal limits. Vascular: Major intracranial vascular flow voids maintained at the skull base. Diminutive vertebrobasilar system noted. Skull and upper cervical spine: Craniocervical junction normal. Upper cervical spine within normal limits. Bone marrow signal intensity normal. No scalp soft tissue abnormality. Sinuses/Orbits: Globes and orbital soft tissues within normal limits. Mild scattered mucosal thickening within the ethmoidal air cells. Paranasal sinuses are otherwise largely clear. Trace right mastoid effusion, of doubtful significance. Inner ear structures grossly normal. Other: None. IMPRESSION: 1. Scattered small volume diffusion abnormality involving the parasagittal cerebral hemispheres bilaterally near the vertex, right greater than left, with additional mild left frontal cortical involvement as above, compatible with acute ischemia. Changes are largely ACA distribution, with some MCA and/or ACA-MCA watershed distribution. 2. Few small remote bilateral cerebellar infarcts. 3. Underlying age-related cerebral atrophy with mild chronic microvascular ischemic disease. Electronically Signed   By: Jeannine Boga M.D.   On: 02/28/2018 01:02   Ct Head Code Stroke Wo Contrast  Result Date: 02/27/2018 CLINICAL DATA:  Code stroke.  73 y/o  M; left-sided weakness. EXAM: CT HEAD WITHOUT CONTRAST TECHNIQUE: Contiguous axial images were obtained from the base of the skull through the vertex without intravenous contrast. COMPARISON:  None. FINDINGS: Brain: No evidence of acute infarction, hemorrhage, hydrocephalus, extra-axial collection or mass lesion/mass effect. Nonspecific white matter hypodensities compatible with chronic microvascular ischemic changes volume loss of the brain. Vascular: Density within right M2 superior division may represent thrombus. Skull: Normal. Negative for fracture or focal lesion. Sinuses/Orbits: No acute  finding. Other: None. ASPECTS [facility] Stroke Program Early CT Score) - Ganglionic level infarction (caudate, lentiform nuclei, internal capsule, insula, M1-M3 cortex): 7 - Supraganglionic infarction (M4-M6 cortex): 3 Total score (0-10 with 10 being normal): 10 IMPRESSION: 1. No acute intracranial abnormality identified. 2. Density within right M2 superior division may represent thrombus. 3. Mild chronic microvascular ischemic changes and volume loss of brain. 4. ASPECTS is 10 These results were called by telephone at the time of interpretation on 02/27/2018 at 4:48 pm to Dr. Lorraine Lax, who verbally acknowledged these results. Electronically Signed   By: Kristine Garbe M.D.   On: 02/27/2018 16:49   US Abdomen Limited Ruq  Result Date: 02/28/2018 CLINICAL DATA:  50-year-old with elevated liver function studies. EXAM: ULTRASOUND ABDOMEN LIMITED RIGHT UPPER QUADRANT COMPARISON:  None. FINDINGS: Gallbladder: The gallbladder is mildly distended. There is a 3 mm gallbladder polyp. No gallstones, gallbladder wall thickening or sonographic Murphy's sign. Common bile duct: Diameter: 4 mm Liver: No focal lesion identified. Within normal limits in parenchymal echogenicity. Portal vein is patent on color Doppler imaging with normal direction of blood flow towards the liver. Incidental imaging of the right kidney demonstrates a small cyst in the lower pole measuring 8 mm maximally as well as a complex adjacent echogenic lesion measuring 12 x 10 x 10 mm. This demonstrates no gross blood flow on this limited examination. Echogenic shadowing foci are present in the mid right kidney consistent with calculi. IMPRESSION: 1. No evidence of gallstones, biliary dilatation or cholecystitis. Tiny gallbladder polyp. 2. The liver appears unremarkable. 3. Indeterminate small mass in the lower pole of the right kidney. This could reflect a complex cyst or solid lesion. Further evaluation recommended. Given the patient's renal  insufficiency, noncontrast renal MRI is likely the best available imaging option. Electronically Signed   By:  Richardean Sale M.D.   On: 02/28/2018 09:01    Labs:  CBC: Recent Labs    02/27/18 1658 03/02/18 0547  WBC 7.1 9.6  HGB 13.2 11.2*  HCT 41.9 34.7*  PLT 194 193    COAGS: Recent Labs    02/27/18 1658  INR 1.08  APTT 28    BMP: Recent Labs    02/27/18 1658 02/27/18 1703 03/02/18 0547 03/03/18 0502  NA 139  --  136 135  K 4.1  --  5.2* 5.3*  CL 101  --  99 98  CO2 24  --  22 20*  GLUCOSE 134*  --  89 84  BUN 31*  --  80* 95*  CALCIUM 9.3  --  8.3* 8.7*  CREATININE 8.45* 8.60* 14.97* 16.83*  GFRNONAA 6*  --  3* 3*  GFRAA 7*  --  3* 3*    LIVER FUNCTION TESTS: Recent Labs    02/27/18 1658  BILITOT 1.5*  AST 28  ALT 42  ALKPHOS 141*  PROT 8.9*  ALBUMIN 3.5     Assessment and Plan:  Left ICA stenosis. Plan for image-guided diagnostic cerebral arteriogram tentatively for tomorrow 03/04/2018 with Dr. Estanislado Pandy. Patient will be NPO at midnight. Afebrile and WBCs WNL. Will hold Heparin per IR protocol, ok to proceed with Plavix use per Dr. Estanislado Pandy. INR 1.08 seconds 02/27/2018.  Risks and benefits of cerebral angiogram were discussed with the patient including, but not limited to bleeding, infection, vascular injury or contrast induced renal failure. This interventional procedure involves the use of X-rays and because of the nature of the planned procedure, it is possible that we will have prolonged use of X-ray fluoroscopy. Potential radiation risks to you include (but are not limited to) the following: - A slightly elevated risk for cancer  several years later in life. This risk is typically less than 0.5% percent. This risk is low in comparison to the normal incidence of human cancer, which is 33% for women and 50% for men according to the [employer]. - Radiation induced injury can include skin redness, resembling a rash, tissue  breakdown / ulcers and hair loss (which can be temporary or permanent).  The likelihood of either of these occurring depends on the difficulty of the procedure and whether you are sensitive to radiation due to previous procedures, disease, or genetic conditions.  IF your procedure requires a prolonged use of radiation, you will be notified and given written instructions for further action.  It is your responsibility to monitor the irradiated area for the 2 weeks following the procedure and to notify your physician if you are concerned that you have suffered a radiation induced injury.   All of the patient's questions were answered, patient is agreeable to proceed. Consent signed and in chart.   Thank you for this interesting consult.  I greatly enjoyed meeting Renn Dirocco and look forward to participating in their care.  A copy of this report was sent to the requesting provider on this date.  Electronically Signed: Earley Abide, PA-C 03/03/2018, 3:18 PM   I spent a total of 40 Minutes in face to face in clinical consultation, greater than 50% of which was counseling/coordinating care for left ICA stenosis.

## 2018-03-03 NOTE — Interval H&P Note (Signed)
History and Physical Interval Note:  03/03/2018 8:31 AM  Peter [name]  has presented today for surgery, with the diagnosis of STROKE  The various methods of treatment have been discussed with the patient and family. After consideration of risks, benefits and other options for treatment, the patient has consented to  Procedure(s) with comments: TRANSESOPHAGEAL ECHOCARDIOGRAM (TEE) (N/A) - loop as a surgical intervention .  The patient's history has been reviewed, patient examined, no change in status, stable for surgery.  I have reviewed the patient's chart and labs.  Questions were answered to the patient's satisfaction.     Elouise Munroe

## 2018-03-03 NOTE — Progress Notes (Signed)
STROKE TEAM PROGRESS NOTE       SUBJECTIVE (INTERVAL HISTORY) His  friend is at the bedside The tried having tube done but they were unable to pass the probe and now it is rescheduled for Friday under anesthesia. OBJECTIVE Vitals:   03/03/18 0947 03/03/18 0957 03/03/18 1007 03/03/18 1141  BP: (!) 183/60 (!) 164/57 (!) 178/54 (!) 169/71  Pulse: 69 67 65 64  Resp: 14 14 16 18   Temp: 97.8 F (36.6 C)   98.4 F (36.9 C)  TempSrc: Oral     SpO2: 100% 97% 99% 100%  Weight:      Height:        CBC:  Recent Labs  Lab 02/27/18 1658 03/02/18 0547  WBC 7.1 9.6  NEUTROABS 4.2 4.0  HGB 13.2 11.2*  HCT 41.9 34.7*  MCV 91.1 89.4  PLT 194 947    Basic Metabolic Panel:  Recent Labs  Lab 03/02/18 0547 03/03/18 0502  NA 136 135  K 5.2* 5.3*  CL 99 98  CO2 22 20*  GLUCOSE 89 84  BUN 80* 95*  CREATININE 14.97* 16.83*  CALCIUM 8.3* 8.7*    Lipid Panel:     Component Value Date/Time   CHOL 106 02/28/2018 0207   TRIG 108 02/28/2018 0207   HDL 36 (L) 02/28/2018 0207   CHOLHDL 2.9 02/28/2018 0207   VLDL 22 02/28/2018 0207   LDLCALC 48 02/28/2018 0207   HgbA1c:  Lab Results  Component Value Date   HGBA1C 6.6 (H) 02/28/2018   Urine Drug Screen: No results found for: LABOPIA, COCAINSCRNUR, LABBENZ, AMPHETMU, THCU, LABBARB  Alcohol Level     Component Value Date/Time   ETH <10 02/27/2018 1658    IMAGING  Ct Angio Head W Or Wo Contrast Ct Angio Neck W Or Wo Contrast 02/27/2018 IMPRESSION:  1. No emergent large vessel occlusion or high-grade intracranial stenosis.  2. Approximately 50% stenosis of the proximal left internal carotid artery.  3. At least moderate stenosis of the right vertebral artery origin.   Aortic Atherosclerosis (ICD10-I70.0) and Emphysema (ICD10-J43.9).    [name] 02/28/2018 IMPRESSION:  1. Scattered small volume diffusion abnormality involving the parasagittal cerebral hemispheres bilaterally near the vertex, right greater than  left, with additional mild left frontal cortical involvement as above, compatible with acute ischemia. Changes are largely ACA distribution, with some MCA and/or ACA-MCA watershed distribution.  2. Few small remote bilateral cerebellar infarcts.   3. Underlying age-related cerebral atrophy with mild chronic microvascular ischemic disease.    Ct Head Code Stroke Wo Contrast 02/27/2018 IMPRESSION:  1. No acute intracranial abnormality identified.  2. Density within right M2 superior division may represent thrombus.  3. Mild chronic microvascular ischemic changes and volume loss of brain.  4. ASPECTS is 10     TTE :  Moderate to severe reduced systolic function with ejection fraction 30-35%. Moderate concentric left ventricle hypertrophy. Diffuse hypokinesis. No definite clot noted.     PHYSICAL EXAM Blood pressure (!) 169/71, pulse 64, temperature 98.4 F (36.9 C), resp. rate 18, height 6\' 3"  (1.905 m), weight 70.5 kg, SpO2 100 %. Pleasant elderly male not in distress. . Afebrile. Head is nontraumatic. Neck is supple without bruit.    Cardiac exam no murmur or gallop. Lungs are clear to auscultation. Distal pulses are well felt.  Neurological Exam ;  Awake  Alert oriented x 3. Normal speech and language.eye movements full without nystagmus.fundi were not visualized. Vision acuity and fields appear normal.  Hearing is normal. Palatal movements are normal. Face symmetric. Tongue midline. Mild left hemiparesis 4/5 strength Orbits right over left upper extremity. Normal sensation. Gait deferred.      ASSESSMENT/PLAN Peter [name] is a 73 y.o. 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 He did not receive IV t-PA due to mild deficits.  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  Resultant  Mild left hand weakness  CT head - Density within right M2 superior division may  represent thrombus.   MRI head - Changes are largely ACA distribution, with some MCA and/or ACA-MCA watershed distribution. Few small remote bilateral cerebellar infarcts.   MRA head - not performed  CTA H&N - No emergent large vessel occlusion or high-grade intracranial stenosis.   Carotid Doppler - CTA neck performed - carotid dopplers not indicated.  2D Echo - diminished ejection fraction 30-35% with diffuse hypokinesis  LDL - 48  HgbA1c - 6.6  UDS - pending  VTE prophylaxis -  Heparin  Diet - Heart healthy with thin liquids.  aspirin 325 mg daily prior to admission, now on aspirin 325 mg daily and plavix  Patient counseled to be compliant with his antithrombotic medications  Ongoing aggressive stroke risk factor management  Therapy recommendations:  CIR recommended  Disposition:  Pending  Hypertension  Blood pressure somewhat high at times but within post stroke parameters . Permissive hypertension (OK if < 220/120) but gradually normalize in 5-7 days . Long-term BP goal normotensive  Hyperlipidemia  Lipid lowering medication PTA:  Lipitor 40 mg daily  LDL 48, goal < 70  Current lipid lowering medication: Lipitor 40 mg daily  Continue statin at discharge  Diabetes  HgbA1c 6.6, goal < 7.0  Controlled  Other Stroke Risk Factors  Advanced age  Former cigarette smoker - quit  Hx stroke/TIA (by imaging)  Coronary artery disease   Other Active Problems  ESRD  May need TEE   Hospital day # 3   .  He presented with left-sided weakness and MRI show multiple small embolic anterior circulation infarcts likely from a central cardiac source. Strong clinical suspicion for A. Fib but  review of patient's chart including care everywhere does not found definite documentation of A. Fib yet. Continue cardiac monitoring and await TEE and prolonged cardiac monitoring . Recommend check diagnostic cerebral catheter angiogram to evaluate carotid siphon stenosis  more accurately to consider possible endovascular treatment.Continue aspirin   and  add Plavix for 3 weeks.D/W Dr Riccardo Dubin and patient's friend.Greater than 50% time during this 25 minute visit was spent on counseling and coordination of care about his embolic strokes and discussion of evaluation and treatment plan and answered questions. Antony Contras, MD Medical Director [facility] Pager: [phone] 03/03/2018 1:46 PM  To contact Stroke Continuity provider, please refer to [email or web address] After hours, contact General Neurology

## 2018-03-03 NOTE — CV Procedure (Signed)
INDICATIONS: Stroke  PROCEDURE:   Informed consent was obtained prior to the procedure. The risks, benefits and alternatives for the procedure were discussed and the patient comprehended these risks.  Risks include, but are not limited to, cough, sore throat, vomiting, nausea, somnolence, esophageal and stomach trauma or perforation, bleeding, low blood pressure, aspiration, pneumonia, infection, trauma to the teeth and death.    After a procedural time-out, the oropharynx was anesthetized with 20% benzocaine spray.   During this procedure the patient was administered a total of Versed 5 mg and Fentanyl 100 mg to achieve and maintain moderate conscious sedation.  The patient's heart rate, blood pressure, and oxygen saturationweare monitored continuously during the procedure. The period of conscious sedation was 20 minutes, of which I was present face-to-face 100% of this time.  The transesophageal probe was not inserted in the esophagus and stomach. We were unable to adequately sedate the patient for intubation of TEE probe, patient was agitated. Procedure will be better performed with anesthesia support.   The patient was kept under observation until the patient left the procedure room.  The patient left the procedure room in stable condition.   Agitated microbubble saline contrast was administered with transthoracic imaging.  COMPLICATIONS:    There were no immediate complications.  FINDINGS:  Unable to perform TEE due to inability to sedate patient adequately for comfort and probe insertion. No PFO by transthoracic imaging with rest and valsalva.   RECOMMENDATIONS:    Can return to hospital room when alert.  Time Spent Directly with the Patient:  60 minutes   Elouise Munroe 03/03/2018, 9:39 AM

## 2018-03-03 NOTE — Progress Notes (Signed)
Admit: 02/27/2018 LOS: 3  56M ESRD, recent PD --> HD with acute CVAs  Subjective:  . TEE this morning, not able to complete secondary to sedation . HD postponed until today . CIR eval pending  02/17 0701 - 02/18 0700 In: 354.4 [P.O.:340; I.V.:14.4] Out: -   Filed Weights   02/27/18 1700 03/03/18 0327 03/03/18 0829  Weight: 70.3 kg 70.5 kg 70.5 kg    Scheduled Meds: . amLODipine  5 mg Oral QHS  . aspirin EC  325 mg Oral Daily  . atorvastatin  40 mg Oral q1800  . clopidogrel  75 mg Oral Daily  . famotidine  20 mg Oral QHS  . fluticasone  2 spray Each Nare BID  . heparin  5,000 Units Subcutaneous Q8H  . metoprolol succinate  200 mg Oral Daily  . multivitamin  1 tablet Oral Daily   Continuous Infusions: PRN Meds:.acetaminophen, polyvinyl alcohol, senna-docusate  Current Labs: reviewed    Physical Exam:  Blood pressure (!) 169/71, pulse 64, temperature 98.4 F (36.9 C), resp. rate 18, height _0  (1.905 m), weight 70.5 kg, SpO2 100 %. RRR nl s1s2 R IJ TDC C/D/I CTAB No sig LEE Weakness in LUE  A 1. ESRD MWF on HD. Was on PD until admitted for PEA arrest [place] late 2019; [name] MD Neph follows 2. Acute CVA, L sided weakness; neuro following; ? To CIR 3. Anemia, Hb stable 4. CKD BMD, use Tums 5. chrnoic systolc CHF 6. HTN, 5 meds as outpatient. Permissive HTN for now  P . HD today  Avoid IDH. Tight heparin. 2K . Medication Issues; o Preferred narcotic agents for pain control are hydromorphone, fentanyl, and methadone. Morphine should not be used.  o Baclofen should be avoided o Avoid oral sodium phosphate and magnesium citrate based laxatives / bowel preps    [name] MD 03/03/2018, 12:39 PM  Recent Labs  Lab 02/27/18 1658 02/27/18 1703 03/02/18 0547 03/03/18 0502  NA 139  --  136 135  K 4.1  --  5.2* 5.3*  CL 101  --  99 98  CO2 24  --  22 20*  GLUCOSE 134*  --  89 84  BUN 31*  --  80* 95*  CREATININE 8.45* 8.60* 14.97* 16.83*  CALCIUM 9.3  --   8.3* 8.7*   Recent Labs  Lab 02/27/18 1658 03/02/18 0547  WBC 7.1 9.6  NEUTROABS 4.2 4.0  HGB 13.2 11.2*  HCT 41.9 34.7*  MCV 91.1 89.4  PLT 194 193

## 2018-03-03 NOTE — Progress Notes (Signed)
Received pt from Linna Hoff, [place] HD tx initiated @ 1750 by Linna Hoff, RN via HD cath w/o problem, per report Bilat ports: pull/push/flush equally w/o problem, per report VSS w/ high bp, per report Will continue to monitor while on HD tx

## 2018-03-03 NOTE — Progress Notes (Signed)
After further discussion and chart review by Dr. Rayann Heman,  EP recommendations are for the patient to follow up with his attending cardiologist regarding monitoring/device discussion, decisions.  I have made the patient aware.  He was appreciative, asks if his record will be sent to his cardiologist.  Informed him that our notes/recommendations are visible in his chart to his cardiologist as well.  Tommye Standard, PA-C

## 2018-03-03 NOTE — Progress Notes (Signed)
IP rehab admissions - I have received a denial from [facility].  If attending MD wants to do a peer to peer with insurance carrier, please call [phone] [phone] to request it before 12 noon on 03/04/18.  The pending reference number is [id number].  Call me for questions.  [phone]

## 2018-03-03 NOTE — Progress Notes (Signed)
HD tx completed @ 2120 w/high bp, and cath function/clotting issues throughout tx UF goal met Blood rinsed back VSS w/  High bp Report called to Olegario Shearer, RN

## 2018-03-03 NOTE — Progress Notes (Signed)
Physical Therapy Treatment Patient Details Name: Peter [name] MRN: [id number] DOB: March 22, 1945 Today's Date: 03/03/2018    History of Present Illness Pt is a 73 y.o. male admitted 02/27/18 with L-sided weakness while at dialysis. MRI shows small volume diffusion abnormality involving parasagittal cerebral hemispheres, additional L frontal cortical involvement compatible with acute ischemia; remote bilat cerebellar infarcts. PMH includes HTN, ESRD on HD.    PT Comments    Patient progressing slowly towards PT goals. Increased time to perform any and all mobility with constant cues needed to perform tasks. Also slow processing noted throughout. Pt s/p TEE this AM and slightly more sleepy that baseline. Pt continues to have left inattention to body. Requires Min A for gait training due to weakness and imbalance with x2 complete LOB needing Mod A for support. Pt not able to recall therapist from session yesterday. Eager to get to CIR. Will follow.   Follow Up Recommendations  CIR;Supervision for mobility/OOB     Equipment Recommendations  Other (comment)(defer)    Recommendations for Other Services       Precautions / Restrictions Precautions Precautions: Fall Precaution Comments: Inattention to left side (body and environment) Restrictions Weight Bearing Restrictions: No    Mobility  Bed Mobility Overal bed mobility: Needs Assistance Bed Mobility: Supine to Sit     Supine to sit: Min guard;HOB elevated     General bed mobility comments: Min Guard A for safety. No assist needed. Very slow to get to EOB.   Transfers Overall transfer level: Needs assistance Equipment used: None Transfers: Sit to/from Stand Sit to Stand: Min assist         General transfer comment: Min A for balance and cues to push through LUE.   Ambulation/Gait Ambulation/Gait assistance: Min assist Gait Distance (Feet): 75 Feet Assistive device: 1 person hand held assist Gait Pattern/deviations:  Step-to pattern;Decreased stride length;Trunk flexed;Step-through pattern;Narrow base of support Gait velocity: Decreased   General Gait Details: Slow, unsteady gait with left knee instability, narrow BoS and LOB x2 requiring external support to maintain balance. HHA on RUE. Cues to widen BoS and increase step length.    Stairs             Wheelchair Mobility    Modified Rankin (Stroke Patients Only) Modified Rankin (Stroke Patients Only) Pre-Morbid Rankin Score: No symptoms Modified Rankin: Moderately severe disability     Balance Overall balance assessment: Needs assistance Sitting-balance support: No upper extremity supported;Feet supported Sitting balance-Leahy Scale: Fair     Standing balance support: During functional activity Standing balance-Leahy Scale: Poor Standing balance comment: Requires external support or UE support for standing balance.                             Cognition Arousal/Alertness: Awake/alert Behavior During Therapy: Flat affect Overall Cognitive Status: Impaired/Different from baseline Area of Impairment: Problem solving;Memory;Awareness;Safety/judgement;Following commands                     Memory: Decreased short-term memory Following Commands: Follows one step commands with increased time Safety/Judgement: Decreased awareness of deficits Awareness: Emergent Problem Solving: Slow processing;Requires verbal cues General Comments: Increased time to process, sequence and follow multi step commands. Poor awareness of deficits. Left inattention to body. "not really sure how I am doing, you tell me."      Exercises      General Comments        Pertinent Vitals/Pain Pain Assessment: No/denies  pain    Home Living                      Prior Function            PT Goals (current goals can now be found in the care plan section) Progress towards PT goals: Progressing toward goals(slowly)     Frequency    Min 4X/week      PT Plan Current plan remains appropriate    Co-evaluation              AM-PAC PT "6 Clicks" Mobility   Outcome Measure  Help needed turning from your back to your side while in a flat bed without using bedrails?: A Little Help needed moving from lying on your back to sitting on the side of a flat bed without using bedrails?: A Little Help needed moving to and from a bed to a chair (including a wheelchair)?: A Little Help needed standing up from a chair using your arms (e.g., wheelchair or bedside chair)?: A Little Help needed to walk in hospital room?: A Little Help needed climbing 3-5 steps with a railing? : A Lot 6 Click Score: 17    End of Session Equipment Utilized During Treatment: Gait belt Activity Tolerance: Patient tolerated treatment well Patient left: in chair;with call bell/phone within reach;with chair alarm set Nurse Communication: Mobility status PT Visit Diagnosis: Other abnormalities of gait and mobility (R26.89);Muscle weakness (generalized) (M62.81)     Time: 7824-2353 PT Time Calculation (min) (ACUTE ONLY): 21 min  Charges:  $Gait Training: 8-22 mins                     Wray Kearns, PT, DPT Acute Rehabilitation Services Pager [phone] Office [phone]       [place] 03/03/2018, 12:30 PM

## 2018-03-03 NOTE — Progress Notes (Addendum)
PROGRESS NOTE    Younes Degeorge  QBV:[id number] DOB: 22-Sep-1945 DOA: 02/27/2018 PCP: Patient, No Pcp Per    Brief Narrative:  73 year old male who presented with weakness. He does have significant past medical history for end-stage renal disease on hemodialysis andhypertension.He developed left sided weakness right after finishing dialysis, he had severe left-sided weakness to the point that wherehe could not get out of the chair. Physical examination blood pressure was 190/72, heart rate 70, respiratory rate 16, temperature 98.9, oxygen saturation 98% his lungs were clear to auscultation bilaterally, heart S1-S2 present and rhythmic, the abdomen was soft nontender. Patient was slow to respond to questions, left side weakness 4 out of 5 upper and lower extremities. Sensation to light touch grossly intact. Strength on the right side was preserved 5 out of 5.39, potassium 4.1, chloride 101, bicarb 24, glucose 134, BUN 31, creatinine 8.4,troponin 0.22, 0.26, 0.22,white cell count 7.1, hemoglobin 13.2, hematocrit 41.9, platelets 194.His head CT had a density within the right M2 superior division possible thrombus.  Patient was admitted to the hospital with a working diagnosis of acute CVA.   Assessment & Plan:   Principal Problem:   Acute CVA (cerebrovascular accident) ([place]) Active Problems:   Elevated troponin   Elevated LFTs   HTN (hypertension)   ESRD on dialysis ([place])   CAD (coronary artery disease)   Diabetes (HCC)   CHF (congestive heart failure) ([place])   Traumatic brain injury ([place])   Anemia   Right kidney mass   TIA (transient ischemic attack)  1. Acute CVA, left anterior cerebral artery distribution with some MCA, watershed distribution, suspected embolic. Improving left upper extremity weakness, continue to have left lower extremity weakness. On dual antiplatelet therapy with full dose aspirin and clopidogrel. Continue with atorvastatin. Case discussed with Neurology,  Dr Leonie Man, patient will need cerebral angiogram. Pending TEE and loop recorder, before transfer to CIR.   2. HTN. Continue with amlodipine for blood pressure control, systolic blood pressure today 169 mmHg, will resume losartan.   3. ESRD. Patient scheduled for HD today, will continue to follow with nephrology recommendations. Patient is euvolemic.  4. Chronic anemia of chronic renal disease. Has remained stable, check cell count as outpatient.   5. T2DM. Fasting glucose is 84 this am, patient is tolerating po well.    6. Systolic heart failure, LV systolic function 30 to 28%, Ischemic cardiomyopathy. Will resume losartan for heart failure, patient is euvolemic, continue ultrafiltration with HD.    7. Right renal mass. Will need to follow up as outpatient.  8. Sins congestion. Continue bid flonase.   DVT prophylaxis:heparin Code Status:full Family Communication:I spoke with patient's family at the bedside and all questions were addressed.  Disposition Plan/ discharge barriers:pending clinical improvement and cardiac workup.Inpatient Rehabilitation.    Body mass index is 19.43 kg/m. Malnutrition Type:      Malnutrition Characteristics:      Nutrition Interventions:     RN Pressure Injury Documentation:     Consultants:   Neurology   Cardiology   Procedures:     Antimicrobials:       Subjective: Patient is feeling better, continue to have left lower extremity weakness, the left upper extremity is better but continue to be weak.  Objective: Vitals:   03/03/18 0947 03/03/18 0957 03/03/18 1007 03/03/18 1141  BP: (!) 183/60 (!) 164/57 (!) 178/54 (!) 169/71  Pulse: 69 67 65 64  Resp: 14 14 16 18   Temp: 97.8 F (36.6 C)   98.4 F (  36.9 C)  TempSrc: Oral     SpO2: 100% 97% 99% 100%  Weight:      Height:        Intake/Output Summary (Last 24 hours) at 03/03/2018 1246 Last data filed at 03/03/2018 6546 Gross per 24 hour  Intake 14.4 ml   Output -  Net 14.4 ml   Filed Weights   02/27/18 1700 03/03/18 0327 03/03/18 0829  Weight: 70.3 kg 70.5 kg 70.5 kg    Examination:   General: deconditioned  Neurology: Awake and alert, non focal  E ENT: mild pallor, no icterus, oral mucosa moist Cardiovascular: No JVD. S1-S2 present, rhythmic, no gallops, rubs, or murmurs. No lower extremity edema. Pulmonary: positive breath sounds bilaterally, adequate air movement, no wheezing, rhonchi or rales. Gastrointestinal. Abdomen with, no organomegaly, non tender, no rebound or guarding/ peritoneal catheter in place.  Skin. No rashes Musculoskeletal: no joint deformities Right IJ HD catheter in place.     Data Reviewed: I have personally reviewed following labs and imaging studies  CBC: Recent Labs  Lab 02/27/18 1658 03/02/18 0547  WBC 7.1 9.6  NEUTROABS 4.2 4.0  HGB 13.2 11.2*  HCT 41.9 34.7*  MCV 91.1 89.4  PLT 194 503   Basic Metabolic Panel: Recent Labs  Lab 02/27/18 1658 02/27/18 1703 03/02/18 0547 03/03/18 0502  NA 139  --  136 135  K 4.1  --  5.2* 5.3*  CL 101  --  99 98  CO2 24  --  22 20*  GLUCOSE 134*  --  89 84  BUN 31*  --  80* 95*  CREATININE 8.45* 8.60* 14.97* 16.83*  CALCIUM 9.3  --  8.3* 8.7*   GFR: Estimated Creatinine Clearance: 4.1 mL/min (A) (by C-G formula based on SCr of 16.83 mg/dL (H)). Liver Function Tests: Recent Labs  Lab 02/27/18 1658  AST 28  ALT 42  ALKPHOS 141*  BILITOT 1.5*  PROT 8.9*  ALBUMIN 3.5   No results for input(s): LIPASE, AMYLASE in the last 168 hours. No results for input(s): AMMONIA in the last 168 hours. Coagulation Profile: Recent Labs  Lab 02/27/18 1658  INR 1.08   Cardiac Enzymes: Recent Labs  Lab 02/27/18 2251 02/28/18 0207 02/28/18 0752  TROPONINI 0.22* 0.26* 0.22*   BNP (last 3 results) No results for input(s): PROBNP in the last 8760 hours. HbA1C: No results for input(s): HGBA1C in the last 72 hours. CBG: No results for input(s):  GLUCAP in the last 168 hours. Lipid Profile: No results for input(s): CHOL, HDL, LDLCALC, TRIG, CHOLHDL, LDLDIRECT in the last 72 hours. Thyroid Function Tests: No results for input(s): TSH, T4TOTAL, FREET4, T3FREE, THYROIDAB in the last 72 hours. Anemia Panel: No results for input(s): VITAMINB12, FOLATE, FERRITIN, TIBC, IRON, RETICCTPCT in the last 72 hours.    Radiology Studies: I have reviewed all of the imaging during this hospital visit personally     Scheduled Meds: . amLODipine  5 mg Oral QHS  . aspirin EC  325 mg Oral Daily  . atorvastatin  40 mg Oral q1800  . clopidogrel  75 mg Oral Daily  . famotidine  20 mg Oral QHS  . fluticasone  2 spray Each Nare BID  . heparin  5,000 Units Subcutaneous Q8H  . metoprolol succinate  200 mg Oral Daily  . multivitamin  1 tablet Oral Daily   Continuous Infusions:   LOS: 3 days        Farouk Vivero Gerome Apley, MD

## 2018-03-03 NOTE — Progress Notes (Signed)
TEE aborted per Dr. Margaretann Loveless. Unable to pass probe. TEE will be rescheduled for another day.

## 2018-03-04 ENCOUNTER — Encounter (HOSPITAL_COMMUNITY): Payer: Self-pay | Admitting: Cardiology

## 2018-03-04 DIAGNOSIS — I2510 Atherosclerotic heart disease of native coronary artery without angina pectoris: Secondary | ICD-10-CM

## 2018-03-04 DIAGNOSIS — E1121 Type 2 diabetes mellitus with diabetic nephropathy: Secondary | ICD-10-CM

## 2018-03-04 DIAGNOSIS — I5022 Chronic systolic (congestive) heart failure: Secondary | ICD-10-CM

## 2018-03-04 DIAGNOSIS — I214 Non-ST elevation (NSTEMI) myocardial infarction: Secondary | ICD-10-CM

## 2018-03-04 LAB — TROPONIN I
Troponin I: 0.44 ng/mL (ref <0.03)
Troponin I: 4.69 ng/mL (ref <0.03)
Troponin I: 6.4 ng/mL (ref <0.03)

## 2018-03-04 LAB — BASIC METABOLIC PANEL
Anion gap: 14 (ref 5–15)
BUN: 44 mg/dL — ABNORMAL HIGH (ref 8–23)
CALCIUM: 8.3 mg/dL — AB (ref 8.9–10.3)
CO2: 22 mmol/L (ref 22–32)
Chloride: 99 mmol/L (ref 98–111)
Creatinine, Ser: 11.06 mg/dL — ABNORMAL HIGH (ref 0.61–1.24)
GFR calc Af Amer: 5 mL/min — ABNORMAL LOW (ref >60)
GFR calc non Af Amer: 4 mL/min — ABNORMAL LOW (ref >60)
Glucose, Bld: 121 mg/dL — ABNORMAL HIGH (ref 70–99)
Potassium: 4.4 mmol/L (ref 3.5–5.1)
Sodium: 135 mmol/L (ref 135–145)

## 2018-03-04 LAB — HEPATITIS B SURFACE ANTIBODY,QUALITATIVE: Hep B S Ab: REACTIVE

## 2018-03-04 NOTE — Consult Note (Addendum)
The patient has been seen in conjunction with Reino Bellis, NP. All aspects of care have been considered and discussed. The patient has been personally interviewed, examined, and all clinical data has been reviewed.   Very difficult situation in this patient with known obstructive coronary disease, prior coronary stent, chronic combined systolic and diastolic heart failure, end-stage kidney disease on dialysis, who presented with a new stroke and within the past 24 hours has had prolonged chest pain.  The ECG does not demonstrate an ST elevation MI but has findings compatible with left ventricular hypertrophy and secondary ST-T wave change.  The patient is felt to have an acute coronary syndrome but is not an invasive candidate due to recent CVA.  We recommend conservative medical management unless the patient becomes hemodynamically unstable.  Medical management would include aggressive statin therapy, blood pressure control coordinated with neurology to prevent cerebral hypoperfusion, LDL less than 70, hemoglobin A1c less than 7, and consideration of dual antiplatelet therapy with aspirin and Plavix if felt safe from neurological standpoint.  Would also ordinarily recommend IV heparin if could be done safely.  Prognosis is guarded given the complex nature of the patient's acute presentation with neurological findings and now superimposed acute coronary syndrome.   Cardiology Consultation:   Patient ID: Peter [name] MRN: [id number]; DOB: 08-21-1945  Admit date: 02/27/2018 Date of Consult: 03/04/2018  Primary Care Provider: Patient, No Pcp Per Primary Cardiologist: No primary care provider on file.  Primary Electrophysiologist:  None    Patient Profile:   Peter [name] is a 73 y.o. male with a hx of ESRD on HD, hypertension, CAD s/p DES to Lcx ('18), diabetes, systolic heart failure and anemia who is being seen today for the evaluation of chest pain, elevated troponin at the request of Dr.  Erlinda Hong.  History of Present Illness:   [name] 73 year old male with past medical history ESRD on HD, hypertension, CAD, diabetes, systolic heart failure and anemia.  He is followed by Dr. Beatrix Fetters for his CAD.  He underwent cardiac catheterization in February 2018 with mild nonobstructive disease to the LAD, and RCA.  Had successful PCI/DES to the left circumflex into a branching OM.  He was placed on dual antiplatelet therapy with aspirin and Brilinta which he completed back in February 2019.  He was continued on aspirin.  Last echocardiogram in 12/12/16 showed a normal EF with mild concentric left ventricular hypertrophy.  Also with basal/anterolateral LV inferior wall hypokinesis.  He presented with shortness of breath back in November 2019 and underwent cardiac cath noting 60% stenosis in the LAD, 40% ostial stenosis in the left circumflex, and 45% mid RCA disease.  After his cardiac catheterization he developed PEA arrest which was felt to be metabolic related as well as respiratory failure.  He was stabilized and transferred out of the ICU.  He developed recurrent shortness of breath and readmitted to the ICU and underwent multiple hemodialysis sessions and a left-sided thoracentesis with negative cultures.  His EF during that admission was noted at 30 to 35%.  Admission he had been on peritoneal dialysis, but was transitioned to hemodialysis.  He presented to the [facility] ED on 02/27/2018 with left-sided weakness after finishing dialysis.  He was slow to respond, and head CT had a density within the right M2 superior division with possible thrombus.  He was evaluated by neurology and underwent an MRI which showed multiple embolic infarcts with unknown source.  EF this admission noted at 30 to 35%  with diffuse hypokinesis.  Radiology was consulted in regards to results on the CT angiogram with possible severe stenosis in the left carotid.  He was planned for a image guided diagnostic cerebral arteriogram  today.  Overnight patient developed a sudden onset of centralized chest pain which felt like a nail stabbing him in the chest.  He was given 1 sublingual nitroglycerin and Phenergan which resolved his symptoms allowing him to fall asleep.  EKG showed sinus rhythm with LVH.  Troponins cycled 0.44>>4.69.  He was transferred down for his cerebral arteriogram today, but reported chest pain while down there.  Therefore testing was canceled and transferred for further cardiology evaluation.  Past Medical History:  Diagnosis Date  . Anemia   . CAD (coronary artery disease)   . CHF (congestive heart failure) ([place])   . Diabetes ([place])   . ESRD (end stage renal disease) on dialysis ([place])   . Headache   . HTN (hypertension)   . PEA (Pulseless electrical activity) ([place]) 11/2017  . Traumatic brain injury [facility])     History reviewed. No pertinent surgical history.   Home Medications:  Prior to Admission medications   Medication Sig Start Date End Date Taking? Authorizing Provider  acetaminophen (TYLENOL) 500 MG tablet Take 1,500 mg by mouth daily as needed (pain).    Yes [provider]  amLODipine (NORVASC) 5 MG tablet Take 5 mg by mouth at bedtime. 02/06/18  Yes [provider]  aspirin EC 325 MG tablet Take 325 mg by mouth daily.   Yes [provider]  atorvastatin (LIPITOR) 40 MG tablet Take 40 mg by mouth daily at 6 PM. 12/24/17  Yes [provider]  Calcium Carbonate Antacid (TUMS PO) Take 1-3 mg by mouth See admin instructions. Take 3 tablets by mouth up to three times daily with full meals, take 1 tablet with snacks/small meals   Yes [provider]  famotidine (PEPCID) 20 MG tablet Take 20 mg by mouth at bedtime. 12/22/17  Yes [provider]  furosemide (LASIX) 40 MG tablet Take 40 mg by mouth 2 (two) times daily. 02/17/18  Yes [provider]  isosorbide mononitrate (IMDUR) 60 MG 24 hr tablet Take 60 mg by mouth daily. 12/25/17  Yes  [provider]  losartan (COZAAR) 100 MG tablet Take 100 mg by mouth daily. 02/25/18  Yes [provider]  metoprolol (TOPROL-XL) 200 MG 24 hr tablet Take 200 mg by mouth daily. 10/24/17  Yes [provider]  multivitamin (RENA-VIT) TABS tablet Take 1 tablet by mouth daily.   Yes [provider]  nitroGLYCERIN (NITROSTAT) 0.4 MG SL tablet Place 0.4 mg under the tongue every 5 (five) minutes as needed for chest pain.  12/25/17  Yes [provider]  polyvinyl alcohol (ARTIFICIAL TEARS) 1.4 % ophthalmic solution Place 1 drop into both eyes daily as needed for dry eyes.   Yes [provider]    Inpatient Medications: Scheduled Meds: . amLODipine  5 mg Oral QHS  . aspirin EC  325 mg Oral Daily  . atorvastatin  40 mg Oral q1800  . clopidogrel  75 mg Oral Daily  . famotidine  20 mg Oral QHS  . fluticasone  2 spray Each Nare BID  . heparin  5,000 Units Subcutaneous Q8H  . losartan  100 mg Oral Daily  . metoprolol succinate  200 mg Oral Daily  . multivitamin  1 tablet Oral Daily   Continuous Infusions:  PRN Meds: acetaminophen, nitroGLYCERIN, polyvinyl  alcohol, senna-docusate  Allergies:    Allergies  Allergen Reactions  . Metformin And Related Other (See Comments)    Contraindicated due to renal failure.  Renal failure-on peritoneal dialysis    . Pioglitazone Other (See Comments)    History of congestive heart failure.    . Propoxyphene Rash    Social History:   Social History   Socioeconomic History  . Marital status: Single    Spouse name: Not on file  . Number of children: Not on file  . Years of education: Not on file  . Highest education level: Not on file  Occupational History  . Not on file  Social Needs  . Financial resource strain: Not on file  . Food insecurity:    Worry: Not on file    Inability: Not on file  . Transportation needs:    Medical: Not on file    Non-medical: Not on file  Tobacco Use  .  Smoking status: Former Smoker  Substance and Sexual Activity  . Alcohol use: Never    Frequency: Never  . Drug use: Never  . Sexual activity: Not Currently  Lifestyle  . Physical activity:    Days per week: Not on file    Minutes per session: Not on file  . Stress: Not on file  Relationships  . Social connections:    Talks on phone: Not on file    Gets together: Not on file    Attends religious service: Not on file    Active member of club or organization: Not on file    Attends meetings of clubs or organizations: Not on file    Relationship status: Not on file  . Intimate partner violence:    Fear of current or ex partner: Not on file    Emotionally abused: Not on file    Physically abused: Not on file    Forced sexual activity: Not on file  Other Topics Concern  . Not on file  Social History Narrative  . Not on file    Family History:    Family History  Problem Relation Age of Onset  . Hypertension Father      ROS:  Please see the history of present illness.   All other ROS reviewed and negative.     Physical Exam/Data:   Vitals:   03/04/18 0544 03/04/18 0741 03/04/18 1137 03/04/18 1145  BP: (!) 144/58 (!) 145/57 (!) 147/63 (!) 150/66  Pulse: 77 72 68 67  Resp: 20 18 16 16   Temp: 98.4 F (36.9 C)     TempSrc: Oral     SpO2: 100% 100% 100% 100%  Weight:      Height:        Intake/Output Summary (Last 24 hours) at 03/04/2018 1257 Last data filed at 03/03/2018 2120 Gross per 24 hour  Intake -  Output 1000 ml  Net -1000 ml   Last 3 Weights 03/03/2018 03/03/2018 03/03/2018  Weight (lbs) 149 lb 14.6 oz 152 lb 1.9 oz 155 lb 6.8 oz  Weight (kg) 68 kg 69 kg 70.5 kg     Body mass index is 18.74 kg/m.  General:  Well nourished, well developed, in no acute distress HEENT: normal Lymph: no adenopathy Neck: no JVD Endocrine:  No thryomegaly Cardiac:  normal S1, S2; VHQ;4/6 systolic murmur  Lungs:  clear to auscultation bilaterally, no wheezing, rhonchi or  rales  Abd: soft, nontender, no hepatomegaly  Ext: no edema Musculoskeletal:  No deformities, BUE and BLE  strength normal and equal. HD cath to upper chest. Skin: warm and dry  Neuro:  CNs 2-12 intact, no focal abnormalities noted Psych:  Normal affect   EKG:  The EKG was personally reviewed and demonstrates: SR 1st degree AVB with LVH    Relevant CV Studies:  TTE: 02/28/2018  IMPRESSIONS    1. The left ventricle has moderate-severely reduced systolic function, with an ejection fraction of 30-35%. The cavity size was mild to moderately dilated. There is moderate concentric left ventricular hypertrophy. Left ventricular diastolic Doppler  parameters are consistent with impaired relaxation Left ventricular diffuse hypokinesis.  2. The right ventricle has normal systolic function. The cavity was normal. There is no increase in right ventricular wall thickness.  3. Left atrial size was moderately dilated.  4. The mitral valve is normal in structure. Moderate thickening of the mitral valve leaflet. Moderate calcification of the anterior and posterior mitral valve leaflets. There is moderate mitral annular calcification present. Mild mitral valve stenosis.  5. The tricuspid valve is normal in structure.  6. The aortic valve is tricuspid Moderate thickening of the aortic valve Moderate calcification of the aortic valve. Aortic valve regurgitation is moderate by color flow Doppler. mild stenosis of the aortic valve.  7. Right atrial pressure is estimated at 3 mmHg.  Laboratory Data:  Chemistry Recent Labs  Lab 03/02/18 0547 03/03/18 0502 03/04/18 0216  NA 136 135 135  K 5.2* 5.3* 4.4  CL 99 98 99  CO2 22 20* 22  GLUCOSE 89 84 121*  BUN 80* 95* 44*  CREATININE 14.97* 16.83* 11.06*  CALCIUM 8.3* 8.7* 8.3*  GFRNONAA 3* 3* 4*  GFRAA 3* 3* 5*  ANIONGAP 15 17* 14    Recent Labs  Lab 02/27/18 1658  PROT 8.9*  ALBUMIN 3.5  AST 28  ALT 42  ALKPHOS 141*  BILITOT 1.5*    Hematology Recent Labs  Lab 02/27/18 1658 03/02/18 0547 03/03/18 1754  WBC 7.1 9.6 8.9  RBC 4.60 3.88* 3.64*  HGB 13.2 11.2* 10.7*  HCT 41.9 34.7* 32.6*  MCV 91.1 89.4 89.6  MCH 28.7 28.9 29.4  MCHC 31.5 32.3 32.8  RDW 15.6* 15.7* 16.1*  PLT 194 193 199   Cardiac Enzymes Recent Labs  Lab 02/27/18 2251 02/28/18 0207 02/28/18 0752 03/04/18 0216 03/04/18 0923  TROPONINI 0.22* 0.26* 0.22* 0.44* 4.69*    Recent Labs  Lab 02/27/18 1702  TROPIPOC 0.09*    BNPNo results for input(s): BNP, PROBNP in the last 168 hours.  DDimer No results for input(s): DDIMER in the last 168 hours.  Radiology/Studies:  Ct Head Wo Contrast  Result Date: 03/02/2018 CLINICAL DATA:  73 year old male with New left upper extremity weakness. Scattered tiny watershed infarcts suspected in both hemispheres on recent brain MRI. EXAM: CT HEAD WITHOUT CONTRAST TECHNIQUE: Contiguous axial images were obtained from the base of the skull through the vertex without intravenous contrast. COMPARISON:  Brain MRI and head CT 02/27/2018 FINDINGS: Brain: Loss of sulci and gray-white matter differentiation in the right superior frontal lobe most apparent on coronal image 40. No regional mass effect. Elsewhere in the right MCA territory gray-white matter differentiation appears stable and preserved. No definite acute intracranial hemorrhage. In the left hemisphere and posterior fossa gray-white matter differentiation appears stable. Ventricle size remains normal. Vascular: Calcified atherosclerosis at the skull base. Questionable new hyperdensity of the right M1 is new compared to 02/27/2018 on series 3, image 12. However, there seems to be generalized increased vascular density when  compared to the prior CT. Skull: Stable.  Chronic right lamina papyracea fracture. Sinuses/Orbits: Visualized paranasal sinuses and mastoids are stable and well pneumatized. Other: No acute orbit or scalp soft tissue findings. IMPRESSION: 1. Loss  of right superior frontal lobe sulci and gray-white matter, most suspicious for cytotoxic edema such as due to new infarct. No mass effect. No definite acute hemorrhage. 2. Seemingly generalized increased density of vascular structures compared to the earlier CT. No recent IV contrast administration is known. Perhaps this might reflect dehydration. Study discussed by telephone with Dr. Amie Portland on 03/02/2018 at 03:53 . Electronically Signed   By: Genevie Ann M.D.   On: 03/02/2018 03:54   [name]  Result Date: 03/02/2018 CLINICAL DATA:  Acute onset left arm weakness. Right frontal edema by CT. EXAM: MRI HEAD WITHOUT CONTRAST TECHNIQUE: Multiplanar, multiecho pulse sequences of the brain and surrounding structures were obtained without intravenous contrast. COMPARISON:  CT earlier same day.  MRI 02/27/2018. FINDINGS: Brain: No appreciable change since the study of 3 days ago. Acute/subacute infarctions affecting the cortical brain at the vertices, right more than left, most consistent with watershed infarctions. These do not appear progressive. There is only mild/minimal brain swelling in the right frontal region. No evidence of mass, hemorrhage, hydrocephalus or extra-axial collection. Vascular: Major vessels at the base of the brain show flow. Skull and upper cervical spine: Negative Sinuses/Orbits: Clear/normal Other: None IMPRESSION: No change since the study of 3 days ago. 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 No new area of involvement. No significant increase in swelling. No sign of hemorrhage. I should note that in review the CT angiogram of 02/27/2018, I think the stenosis at the distal bulb on the left is 70% or greater. I think there is very severe stenosis in the left carotid siphon, likely flow limiting. Moderate stenosis in the right carotid siphon. Electronically Signed   By: Nelson Chimes M.D.   On: 03/02/2018 10:11     Assessment and Plan:   Arsen Mangione is a 73 y.o. male with a hx of ESRD on HD, hypertension, CAD s/p DES to Lcx ('18), diabetes, systolic heart failure and anemia who is being seen today for the evaluation of chest pain, elevated troponin at the request of Dr. Erlinda Hong.  1. NSTEMI: Developed acute onset on chest pain last night with troponin 0.4>>4.69. EKG with LVH and 1st degree AVB. Resolved with SL nitro. Had some mild discomfort today while waiting for IR procedure. This was canceled and brought back to room. Still mild discomfort but significantly improved. Overall this is very concerning for ACS but clinical picture is complicated given his acute CVA. At this time would not consider him a good candidate for invasive procedure and would be high risk.  -- aggressive medical therapy with ASA, statin, BB, ARB.   2. Chronic systolic HF: Known EF of 67% from admission back in 11/19. Volume status managed by nephrology.  -- on BB and ARB  3. Acute CVA: neurology following. Recommendations for CIR.   4. ESRD on HD: had previously been on PD but switched to HD back in 11/19 following PEA arrest 2/2 to noncompliance with PD.    For questions or updates, please contact [place] Please consult [email or web address] for contact info under     Signed, Reino Bellis, NP  03/04/2018 12:57 PM

## 2018-03-04 NOTE — Progress Notes (Addendum)
Inpatient Rehabilitation Admissions Coordinator  I met with patient at bedside and then contacted pt's daughter, [name], by phone to make them aware that [facility] has given a denial for admission to inpt rehab . [name] is requesting that Hospitalists MD pursue peer to peer appeal. I will contact Dr. Erlinda Hong to request peer to peer . [employer] states the peer to peer has to be initiated by 5 pm today. [phone] ext [phone]. Pending reference number is [id number].  Danne Baxter, RN, MSN Rehab Admissions Coordinator [phone] 03/04/2018 9:30 AM

## 2018-03-04 NOTE — Progress Notes (Signed)
STROKE TEAM PROGRESS NOTE       SUBJECTIVE (INTERVAL HISTORY) His  friend is at the bedside   patient`s cerebral catheter angiogram planned for today has been cancer as patient complained of chest pain last night and has elevated troponin and cardiology has been consulted OBJECTIVE Vitals:   03/04/18 0544 03/04/18 0741 03/04/18 1137 03/04/18 1145  BP: (!) 144/58 (!) 145/57 (!) 147/63 (!) 150/66  Pulse: 77 72 68 67  Resp: 20 18 16 16   Temp: 98.4 F (36.9 C)     TempSrc: Oral     SpO2: 100% 100% 100% 100%  Weight:      Height:        CBC:  Recent Labs  Lab 02/27/18 1658 03/02/18 0547 03/03/18 1754  WBC 7.1 9.6 8.9  NEUTROABS 4.2 4.0  --   HGB 13.2 11.2* 10.7*  HCT 41.9 34.7* 32.6*  MCV 91.1 89.4 89.6  PLT 194 193 366    Basic Metabolic Panel:  Recent Labs  Lab 03/03/18 0502 03/04/18 0216  NA 135 135  K 5.3* 4.4  CL 98 99  CO2 20* 22  GLUCOSE 84 121*  BUN 95* 44*  CREATININE 16.83* 11.06*  CALCIUM 8.7* 8.3*    Lipid Panel:     Component Value Date/Time   CHOL 106 02/28/2018 0207   TRIG 108 02/28/2018 0207   HDL 36 (L) 02/28/2018 0207   CHOLHDL 2.9 02/28/2018 0207   VLDL 22 02/28/2018 0207   LDLCALC 48 02/28/2018 0207   HgbA1c:  Lab Results  Component Value Date   HGBA1C 6.6 (H) 02/28/2018   Urine Drug Screen: No results found for: LABOPIA, COCAINSCRNUR, LABBENZ, AMPHETMU, THCU, LABBARB  Alcohol Level     Component Value Date/Time   ETH <10 02/27/2018 1658    IMAGING  Ct Angio Head W Or Wo Contrast Ct Angio Neck W Or Wo Contrast 02/27/2018 IMPRESSION:  1. No emergent large vessel occlusion or high-grade intracranial stenosis.  2. Approximately 50% stenosis of the proximal left internal carotid artery.  3. At least moderate stenosis of the right vertebral artery origin.   Aortic Atherosclerosis (ICD10-I70.0) and Emphysema (ICD10-J43.9).    [name] 02/28/2018 IMPRESSION:  1. Scattered small volume diffusion abnormality  involving the parasagittal cerebral hemispheres bilaterally near the vertex, right greater than left, with additional mild left frontal cortical involvement as above, compatible with acute ischemia. Changes are largely ACA distribution, with some MCA and/or ACA-MCA watershed distribution.  2. Few small remote bilateral cerebellar infarcts.   3. Underlying age-related cerebral atrophy with mild chronic microvascular ischemic disease.    Ct Head Code Stroke Wo Contrast 02/27/2018 IMPRESSION:  1. No acute intracranial abnormality identified.  2. Density within right M2 superior division may represent thrombus.  3. Mild chronic microvascular ischemic changes and volume loss of brain.  4. ASPECTS is 10     TTE :  Moderate to severe reduced systolic function with ejection fraction 30-35%. Moderate concentric left ventricle hypertrophy. Diffuse hypokinesis. No definite clot noted.     PHYSICAL EXAM Blood pressure (!) 150/66, pulse 67, temperature 98.4 F (36.9 C), temperature source Oral, resp. rate 16, height 6\' 3"  (1.905 m), weight 68 kg, SpO2 100 %. Pleasant elderly male not in distress. . Afebrile. Head is nontraumatic. Neck is supple without bruit.    Cardiac exam no murmur or gallop. Lungs are clear to auscultation. Distal pulses are well felt.  Neurological Exam ;  Awake  Alert oriented x 3.  Normal speech and language.eye movements full without nystagmus.fundi were not visualized. Vision acuity and fields appear normal. Hearing is normal. Palatal movements are normal. Face symmetric. Tongue midline. Mild left hemiparesis 4/5 strength Orbits right over left upper extremity. Normal sensation. Gait deferred.      ASSESSMENT/PLAN [name] is a 73 y.o. 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 He did not receive IV t-PA due to mild deficits.  Stroke:  Multiple infarcts -  embolic - unknown source.  Resultant  Mild left hand weakness  CT head - Density within right M2 superior division may represent thrombus.   MRI head - Changes are largely ACA distribution, with some MCA and/or ACA-MCA watershed distribution. Few small remote bilateral cerebellar infarcts.   MRA head - not performed  CTA H&N - No emergent large vessel occlusion or high-grade intracranial stenosis.   Carotid Doppler - CTA neck performed - carotid dopplers not indicated.  2D Echo - diminished ejection fraction 30-35% with diffuse hypokinesis  LDL - 48  HgbA1c - 6.6  UDS - pending  VTE prophylaxis - [place] Heparin  Diet - Heart healthy with thin liquids.  aspirin 325 mg daily prior to admission, now on aspirin 325 mg daily and plavix  Patient counseled to be compliant with his antithrombotic medications  Ongoing aggressive stroke risk factor management  Therapy recommendations:  CIR recommended  Disposition:  Pending  Hypertension  Blood pressure somewhat high at times but within post stroke parameters . Permissive hypertension (OK if < 220/120) but gradually normalize in 5-7 days . Long-term BP goal normotensive  Hyperlipidemia  Lipid lowering medication PTA:  Lipitor 40 mg daily  LDL 48, goal < 70  Current lipid lowering medication: Lipitor 40 mg daily  Continue statin at discharge  Diabetes  HgbA1c 6.6, goal < 7.0  Controlled  Other Stroke Risk Factors  Advanced age  Former cigarette smoker - quit  Hx stroke/TIA (by imaging)  Coronary artery disease   Other Active Problems  ESRD  May need TEE   Hospital day # 4   .  He presented with left-sided weakness and MRI show multiple small embolic anterior circulation infarcts likely from a central cardiac source. Strong clinical suspicion for A. Fib but  review of patient's chart including care everywhere does not found definite documentation of A. Fib yet. Continue cardiac monitoring and await TEE  and prolonged cardiac monitoring . Recommend hold diagnostic cerebral catheter angiogram to evaluate carotid siphon stenosis more accurately to consider possible endovascular treatment.Continue aspirin   and  add Plavix for 3 weeks.D/W Dr Erlinda Hong  Await cardiology evaluation for chest pain and elevated troponin.Greater than 50% time during this 25 minute visit was spent on counseling and coordination of care about his embolic strokes and discussion of evaluation and treatment plan and answered questions. Antony Contras, MD Medical Director [facility] Pager: [phone] 03/04/2018 2:44 PM  To contact Stroke Continuity provider, please refer to [email or web address] After hours, contact General Neurology

## 2018-03-04 NOTE — Progress Notes (Signed)
PROGRESS NOTE  Peter [name] LEX:[id number] DOB: Jan 24, 1945 DOA: 02/27/2018 PCP: Patient, No Pcp Per  HPI/Recap of past 24 hours:  Had left sided chest pain last night with nausea, reports feeling better after nitroglycerin, troponin went up from 0.4 to 4.69 He reports having intermittent chest pain for the last few weeks He reports just recovering from a prolonged hospitalization last year, started driving two weeks ago  Assessment/Plan: Principal Problem:   Acute CVA (cerebrovascular accident) ([place]) Active Problems:   Elevated troponin   Elevated LFTs   HTN (hypertension)   ESRD on dialysis ([place])   CAD (coronary artery disease)   Diabetes ([place])   CHF (congestive heart failure) ([place])   Traumatic brain injury ([place])   Anemia   Right kidney mass   TIA (transient ischemic attack)  NSTEMI: -had chest pain midnight (2/18-2/19), troponin went up , cardiology consulted. Cerebral angiogram cancelled. -h/o cad s/p stent  Acute Stroke:  Multiple infarcts - embolic - unknown source. (presenting symptom) -patient presented with left sided weakness after dialysis, He did not receive IV t-PA due to mild deficits -angiogram cancelled due to acute acs -aspirin 325 mg daily prior to admission, now on aspirin 325 mg daily and plavix -neurology input appreciated, will follow neurology recommendation  ESRD on HD Anemia of chronic disease Dialysis per nephrology  Chronic systolic heart failure: volume managed by dialysis  Right renal mass.Will need to follow up as outpatient  FTT; CIR vs SNF   Code Status: full  Family Communication: patient and daughter over the phone  Disposition Plan: not ready to discharge, needs cardiology/neurology /IR clearance   Consultants:  cardiology  Neurology  nephrology  IR  CIR  Procedures:  dialysis  Antibiotics:  none   Objective: BP (!) 150/66 (BP Location: Left Arm)   Pulse 67   Temp 98.4 F (36.9 C) (Oral)   Resp 16   Ht  6\' 3"  (1.905 m)   Wt 68 kg   SpO2 100%   BMI 18.74 kg/m   Intake/Output Summary (Last 24 hours) at 03/04/2018 1307 Last data filed at 03/03/2018 2120 Gross per 24 hour  Intake -  Output 1000 ml  Net -1000 ml   Filed Weights   03/03/18 0829 03/03/18 1740 03/03/18 2127  Weight: 70.5 kg 69 kg 68 kg    Exam: Patient is examined daily including today on 03/04/2018, exams remain the same as of yesterday except that has changed    General:  NAD, chronically ill appearing  Cardiovascular: RRR  Respiratory: CTABL  Abdomen: Soft/ND/NT, positive BS  Musculoskeletal: No Edema  Neuro: alert, oriented   Data Reviewed: Basic Metabolic Panel: Recent Labs  Lab 02/27/18 1658 02/27/18 1703 03/02/18 0547 03/03/18 0502 03/04/18 0216  NA 139  --  136 135 135  K 4.1  --  5.2* 5.3* 4.4  CL 101  --  99 98 99  CO2 24  --  22 20* 22  GLUCOSE 134*  --  89 84 121*  BUN 31*  --  80* 95* 44*  CREATININE 8.45* 8.60* 14.97* 16.83* 11.06*  CALCIUM 9.3  --  8.3* 8.7* 8.3*   Liver Function Tests: Recent Labs  Lab 02/27/18 1658  AST 28  ALT 42  ALKPHOS 141*  BILITOT 1.5*  PROT 8.9*  ALBUMIN 3.5   No results for input(s): LIPASE, AMYLASE in the last 168 hours. No results for input(s): AMMONIA in the last 168 hours. CBC: Recent Labs  Lab 02/27/18 1658 03/02/18 0547  03/03/18 1754  WBC 7.1 9.6 8.9  NEUTROABS 4.2 4.0  --   HGB 13.2 11.2* 10.7*  HCT 41.9 34.7* 32.6*  MCV 91.1 89.4 89.6  PLT 194 193 199   Cardiac Enzymes:   Recent Labs  Lab 02/27/18 2251 02/28/18 0207 02/28/18 0752 03/04/18 0216 03/04/18 0923  TROPONINI 0.22* 0.26* 0.22* 0.44* 4.69*   BNP (last 3 results) No results for input(s): BNP in the last 8760 hours.  ProBNP (last 3 results) No results for input(s): PROBNP in the last 8760 hours.  CBG: No results for input(s): GLUCAP in the last 168 hours.  No results found for this or any previous visit (from the past 240 hour(s)).   Studies: No results  found.  Scheduled Meds: . amLODipine  5 mg Oral QHS  . aspirin EC  325 mg Oral Daily  . atorvastatin  40 mg Oral q1800  . clopidogrel  75 mg Oral Daily  . famotidine  20 mg Oral QHS  . fluticasone  2 spray Each Nare BID  . heparin  5,000 Units Subcutaneous Q8H  . losartan  100 mg Oral Daily  . metoprolol succinate  200 mg Oral Daily  . multivitamin  1 tablet Oral Daily    Continuous Infusions:   Time spent: 53mins, case discussed with cardiology, neurology, interventional radiology I have personally reviewed and interpreted on  03/04/2018 daily labs, tele strips, imagings as discussed above under date review session and assessment and plans.  I reviewed all nursing notes, pharmacy notes, consultant notes,  vitals, pertinent old records  I have discussed plan of care as described above with RN , patient and family on 03/04/2018   Florencia Reasons MD, PhD  [facility] Pager [phone]. If 7PM-7AM, please contact night-coverage at [email or web address], password [email or web address] 03/04/2018, 1:07 PM  LOS: 4 days

## 2018-03-04 NOTE — Progress Notes (Signed)
CRITICAL VALUE ALERT  Critical Value:  Trop 0.44 Date & Time Notied:  03/04/2018 6659 Provider Notified: Lamar Blinks Orders Received/Actions taken: Awaiting response

## 2018-03-04 NOTE — Progress Notes (Signed)
ECG completed and handed to [facility], MD. Patient experiencing mild chest pain. MD aware. Vitals remain stable at this time.

## 2018-03-04 NOTE — Progress Notes (Signed)
Procedure cancelled . Patient transporting back upstairs by this RN

## 2018-03-04 NOTE — Progress Notes (Signed)
CRITICAL VALUE ALERT  Critical Value:  Troponin 4.69  Date & Time Notied:  03/04/18 at 44  Provider Notified: Dr. Florencia Reasons  Orders Received/Actions taken: none yet, will update once orders are received

## 2018-03-04 NOTE — Progress Notes (Signed)
Inpatient Rehabilitation Admissions Coordinator  Discussed with Dr. Erlinda Hong, will hold pursuing insurance approval for CIR admit for pt not medically ready with medical workup continues.  Danne Baxter, RN, MSN Rehab Admissions Coordinator [phone] 03/04/2018 7:53 PM

## 2018-03-04 NOTE — NC FL2 (Signed)
[place] LEVEL OF CARE SCREENING TOOL     IDENTIFICATION  Patient Name: Peter [name] Birthdate: 1945/04/02 Sex: male Admission Date (Current Location): 02/27/2018  [facility] and [place] Number:  Herbalist and Address:  [facility] [place], [place] [address] [place], [place] [address]      Provider Number: [id number]  Attending Physician Name and Address:  Florencia Reasons, MD  Relative Name and Phone Number:       Current Level of Care: Hospital Recommended Level of Care: [place] Prior Approval Number:    Date Approved/Denied:   PASRR Number: [id number]  Discharge Plan: SNF    Current Diagnoses: Patient Active Problem List   Diagnosis Date Noted  . Acute CVA (cerebrovascular accident) ([place]) 02/28/2018  . ESRD on dialysis ([place]) 02/28/2018  . Right kidney mass 02/28/2018  . TIA (transient ischemic attack) 02/28/2018  . CAD (coronary artery disease)   . Diabetes ([place])   . CHF (congestive heart failure) ([place])   . Traumatic brain injury ([place])   . Anemia   . Elevated troponin 02/27/2018  . Elevated LFTs 02/27/2018  . HTN (hypertension) 02/27/2018  . PEA (Pulseless electrical activity) ([place]) 11/14/2017    Orientation RESPIRATION BLADDER Height & Weight     Self, Time, Situation, Place  O2(Nasal Cannula 3L) Incontinent Weight: 149 lb 14.6 oz (68 kg) Height:  6\' 3"  (190.5 cm)  BEHAVIORAL SYMPTOMS/MOOD NEUROLOGICAL BOWEL NUTRITION STATUS      Continent Diet(renal with Fluid restriction: 1200 mL Fluid)  AMBULATORY STATUS COMMUNICATION OF NEEDS Skin   Limited Assist Verbally Normal                       Personal Care Assistance Level of Assistance  Dressing, Feeding, Bathing Bathing Assistance: Limited assistance Feeding assistance: Independent Dressing Assistance: Limited assistance     Functional Limitations Info  Sight, Hearing, Speech Sight Info: Adequate Hearing Info: Adequate Speech Info: Adequate     SPECIAL CARE FACTORS FREQUENCY  PT (By licensed PT), OT (By licensed OT)     PT Frequency: 4x OT Frequency: 4x            Contractures Contractures Info: Not present    Additional Factors Info  Code Status, Allergies Code Status Info: Full Code Allergies Info: Metformin And Related, Pioglitazone, Propoxyphene           Current Medications (03/04/2018):  This is the current hospital active medication list Current Facility-Administered Medications  Medication Dose Route Frequency Provider Last Rate Last Dose  . acetaminophen (TYLENOL) tablet 650 mg  650 mg Oral Q6H PRN Shela Leff, MD   650 mg at 03/02/18 0358  . amLODipine (NORVASC) tablet 5 mg  5 mg Oral QHS Shela Leff, MD   5 mg at 03/04/18 0044  . aspirin EC tablet 325 mg  325 mg Oral Daily Shela Leff, MD   325 mg at 03/04/18 0912  . atorvastatin (LIPITOR) tablet 40 mg  40 mg Oral q1800 Shela Leff, MD   40 mg at 03/03/18 1713  . clopidogrel (PLAVIX) tablet 75 mg  75 mg Oral Daily Garvin Fila, MD   75 mg at 03/04/18 0912  . famotidine (PEPCID) tablet 20 mg  20 mg Oral QHS Shela Leff, MD   20 mg at 03/04/18 0044  . fluticasone (FLONASE) 50 MCG/ACT nasal spray 2 spray  2 spray Each Nare BID Arrien, Amro Picket, MD   2 spray at 03/04/18  7619  . heparin injection 5,000 Units  5,000 Units Subcutaneous Q8H Louk, Alexandra M, PA-C      . losartan (COZAAR) tablet 100 mg  100 mg Oral Daily Arrien, Leon Picket, MD   100 mg at 03/04/18 0912  . metoprolol succinate (TOPROL-XL) 24 hr tablet 200 mg  200 mg Oral Daily Arrien, Winnie Picket, MD   200 mg at 03/04/18 5093  . multivitamin (RENA-VIT) tablet 1 tablet  1 tablet Oral Daily Shela Leff, MD   1 tablet at 03/04/18 0912  . nitroGLYCERIN (NITROSTAT) SL tablet 0.4 mg  0.4 mg Sublingual Q5 min PRN Schorr, Rhetta Mura, NP   0.4 mg at 03/03/18 2340  . polyvinyl alcohol (LIQUIFILM TEARS) 1.4 % ophthalmic solution 1 drop  1 drop  Both Eyes Daily PRN Shela Leff, MD      . senna-docusate (Senokot-S) tablet 1 tablet  1 tablet Oral QHS PRN Shela Leff, MD         Discharge Medications: Please see discharge summary for a list of discharge medications.  Relevant Imaging Results:  Relevant Lab Results:   Additional Information SSN; [id number]  Eileen Stanford, LCSW

## 2018-03-04 NOTE — Progress Notes (Signed)
Patient complaining of chest pain 7/10 and nausea. Text paged NP. Orders received for EKG,nitro and phenergan. Orders carried out. Patient rates chest pain 0/10. BP 170/62 after administration of chest pain. Will continue to monitor

## 2018-03-04 NOTE — Progress Notes (Signed)
Occupational Therapy Treatment Patient Details Name: Peter [name] MRN: [id number] DOB: [birth year] Today's Date: 03/04/2018    History of present illness Pt is a 73 y.o. male admitted 02/27/18 with L-sided weakness while at dialysis. MRI shows small volume diffusion abnormality involving parasagittal cerebral hemispheres, additional L frontal cortical involvement compatible with acute ischemia; remote bilat cerebellar infarcts. PMH includes HTN, ESRD on HD.   OT comments  Pt making great progress toward his OT goals. Pt's L UE has improved and pt is able to functionally manipulate self care items. Min A required to power up into standing. Min A- min guard balance support provided throughout LB bathing in standing. Pt may progress to home with HHOT, but based on today's performance recommend CIR.    Follow Up Recommendations  CIR;Supervision/Assistance - 24 hour    Equipment Recommendations  None recommended by OT    Recommendations for Other Services Rehab consult;PT consult;Speech consult    Precautions / Restrictions Precautions Precautions: Fall Restrictions Weight Bearing Restrictions: No       Mobility Bed Mobility Overal bed mobility: Needs Assistance Bed Mobility: Supine to Sit     Supine to sit: Supervision     General bed mobility comments: Pt in recliner upon entry  Transfers Overall transfer level: Needs assistance Equipment used: Rolling walker (2 wheeled) Transfers: Sit to/from Stand Sit to Stand: Min assist         General transfer comment: min A to power up into standing     Balance Overall balance assessment: Needs assistance Sitting-balance support: No upper extremity supported;Feet supported Sitting balance-Leahy Scale: Good Sitting balance - Comments: pt able to don socks bilaterally sitting EOB with supervision Postural control: Right lateral lean Standing balance support: During functional activity;Bilateral upper extremity supported Standing  balance-Leahy Scale: Poor Standing balance comment: Requires external support or UE support for standing balance.                            ADL either performed or assessed with clinical judgement   ADL Overall ADL's : Needs assistance/impaired Eating/Feeding: Supervision/ safety;Sitting   Grooming: Supervision/safety;Sitting   Upper Body Bathing: Supervision/ safety;Sitting   Lower Body Bathing: Min guard;Sit to/from stand Lower Body Bathing Details (indicate cue type and reason): Pt required min A to power up from low chair to complete peri hygiene in standing Upper Body Dressing : Min guard;Sitting   Lower Body Dressing: Minimal assistance;Cueing for sequencing;Sit to/from stand   Toilet Transfer: Ambulation;RW;Min guard;BSC;Grab bars           Functional mobility during ADLs: Minimal assistance;Rolling walker;Min guard General ADL Comments: Pt very friendly and chatty this session, balance deficits and safety awareness present. Improved use of L UE this session               Cognition Arousal/Alertness: Awake/alert Behavior During Therapy: WFL for tasks assessed/performed Overall Cognitive Status: Impaired/Different from baseline Area of Impairment: Safety/judgement;Problem solving;Memory                   Current Attention Level: Selective Memory: Decreased short-term memory Following Commands: Follows multi-step commands with increased time Safety/Judgement: Decreased awareness of deficits;Decreased awareness of safety Awareness: Emergent Problem Solving: Slow processing General Comments: Pt with questionable memory, no family present to confirm PLOF              General Comments No family present during session     Pertinent Vitals/ Pain  Pain Assessment: No/denies pain  Home Living   Living Arrangements: Alone                                          Frequency  Min 3X/week        Progress Toward  Goals  OT Goals(current goals can now be found in the care plan section)  Progress towards OT goals: Progressing toward goals  Acute Rehab OT Goals Patient Stated Goal: Return to normal OT Goal Formulation: With patient/family Time For Goal Achievement: 03/14/18 Potential to Achieve Goals: Good  Plan Discharge plan remains appropriate       AM-PAC OT "6 Clicks" Daily Activity     Outcome Measure   Help from another person eating meals?: A Little Help from another person taking care of personal grooming?: A Little Help from another person toileting, which includes using toliet, bedpan, or urinal?: A Little Help from another person bathing (including washing, rinsing, drying)?: A Little Help from another person to put on and taking off regular upper body clothing?: A Little Help from another person to put on and taking off regular lower body clothing?: A Little 6 Click Score: 18    End of Session Equipment Utilized During Treatment: Rolling walker  OT Visit Diagnosis: Unsteadiness on feet (R26.81);Other abnormalities of gait and mobility (R26.89);Muscle weakness (generalized) (M62.81);Other symptoms and signs involving cognitive function;Pain;Hemiplegia and hemiparesis;Low vision, both eyes (H54.2) Hemiplegia - Right/Left: Left Hemiplegia - dominant/non-dominant: Non-Dominant Hemiplegia - caused by: Cerebral infarction   Activity Tolerance Patient tolerated treatment well   Patient Left in chair;with call bell/phone within reach;with chair alarm set;with family/visitor present   Nurse Communication Mobility status        Time: 8127-5170 OT Time Calculation (min): 31 min  Charges: OT General Charges $OT Visit: 1 Visit OT Treatments $Self Care/Home Management : 23-37 mins   Curtis Sites OTR/L  03/04/2018, 4:13 PM

## 2018-03-04 NOTE — Progress Notes (Addendum)
Patient currently having active chest pain. Labs reviewed and troponin noted to have risen from 0.44 to 4.69. Patient immediately hooked up to monitor, vitals assessed,  placed on oxygen. Attending floor MD, floor RN, Devershwar, MD and [facility], all notified of situation. STAT ECG ordered.

## 2018-03-04 NOTE — Progress Notes (Signed)
Patient was scheduled for an image-guided diagnostic cerebral arteriogram tentatively for today with Dr. Estanislado Pandy.  Upon arrival to IR, patient complaint of chest pain. Labs reviewed an troponin noted to rise from 0.44 to 4.69 today. Stat EKG ordered and reviewed by Dr. Estanislado Pandy.  Dr. Estanislado Pandy spoke with Dr. Erlinda Hong and recommended cardiology consult prior to proceeding with procedure. Dr. Estanislado Pandy informed patient that procedure will not occur today and we will require cardiac clearance prior to moving forward with procedure.  All questions answered and concerns addressed. Patient conveys understanding and agrees with plan.  Bea Graff Louk, PA-C 03/04/2018, 12:13 PM

## 2018-03-04 NOTE — Progress Notes (Signed)
Admit: 02/27/2018 LOS: 4  48M ESRD, recent PD --> HD with acute CVAs  Subjective:  . HD alst evening, some N/V at end of Tx, 1L UF . Had CP overnight, ruled in for NSTEMI Trp is > 4 . Was to have cerebral angiogram, cancelled 2/2 above  02/18 0701 - 02/19 0700 In: -  Out: 1000   Filed Weights   03/03/18 0829 03/03/18 1740 03/03/18 2127  Weight: 70.5 kg 69 kg 68 kg    Scheduled Meds: . amLODipine  5 mg Oral QHS  . aspirin EC  325 mg Oral Daily  . atorvastatin  40 mg Oral q1800  . clopidogrel  75 mg Oral Daily  . famotidine  20 mg Oral QHS  . fluticasone  2 spray Each Nare BID  . heparin  5,000 Units Subcutaneous Q8H  . losartan  100 mg Oral Daily  . metoprolol succinate  200 mg Oral Daily  . multivitamin  1 tablet Oral Daily   Continuous Infusions: PRN Meds:.acetaminophen, nitroGLYCERIN, polyvinyl alcohol, senna-docusate  Current Labs: reviewed    Physical Exam:  Blood pressure (!) 150/66, pulse 67, temperature 98.4 F (36.9 C), temperature source Oral, resp. rate 16, height _0  (1.905 m), weight 68 kg, SpO2 100 %. RRR nl s1s2 R IJ TDC C/D/I CTAB No sig LEE Weakness in LUE  A 1. ESRD MWF on HD. Was on PD until admitted for PEA arrest [place] late 2019; [name] MD Neph follows 2. Acute CVA, L sided weakness; neuro following; ? To CIR.  Sig cerebral stenosis but not able to do angiogram 2/2 #3 3. NSTEMI 03/04/18: med mgmt for now 4. Anemia, Hb stable 5. CKD BMD, use Tums 6. chrnoic systolc CHF 7. HTN, 5 meds as outpatient. Permissive HTN for now  P . HD off schedule for now, next tomorrow: Avoid IDH. Tight heparin. 2K . Medication Issues; o Preferred narcotic agents for pain control are hydromorphone, fentanyl, and methadone. Morphine should not be used.  o Baclofen should be avoided o Avoid oral sodium phosphate and magnesium citrate based laxatives / bowel preps    [name] MD 03/04/2018, 3:02 PM  Recent Labs  Lab 03/02/18 0547 03/03/18 0502  03/04/18 0216  NA 136 135 135  K 5.2* 5.3* 4.4  CL 99 98 99  CO2 22 20* 22  GLUCOSE 89 84 121*  BUN 80* 95* 44*  CREATININE 14.97* 16.83* 11.06*  CALCIUM 8.3* 8.7* 8.3*   Recent Labs  Lab 02/27/18 1658 03/02/18 0547 03/03/18 1754  WBC 7.1 9.6 8.9  NEUTROABS 4.2 4.0  --   HGB 13.2 11.2* 10.7*  HCT 41.9 34.7* 32.6*  MCV 91.1 89.4 89.6  PLT 194 193 199

## 2018-03-04 NOTE — Care Management Important Message (Signed)
Important Message  Patient Details  Name: Peter [name] MRN: [id number] Date of Birth: 27-Jan-1945   Medicare Important Message Given:  Yes    [name] Montine Circle 03/04/2018, 12:07 PM

## 2018-03-04 NOTE — Progress Notes (Addendum)
Physical Therapy Treatment Patient Details Name: Peter [name] MRN: [id number] DOB: 10-Mar-1945 Today's Date: 03/04/2018    History of Present Illness Pt is a 73 y.o. male admitted 02/27/18 with L-sided weakness while at dialysis. MRI shows small volume diffusion abnormality involving parasagittal cerebral hemispheres, additional L frontal cortical involvement compatible with acute ischemia; remote bilat cerebellar infarcts. PMH includes HTN, ESRD on HD.    PT Comments    Pt continuing to progress slowly overall with functional mobility. His responses are very delayed to commands and questions, and he requires increased time for processing to complete tasks. Continue to feel pt would greatly benefit from further therapy services at CIR to maximize his independence with functional mobility prior to d/c home.   Of note pt with active chest pain this AM and elevated troponin levels. Pt had an EKG performed which demonstrated LVH and 1st degree AVB. Cardiology was consulted and recommending medical management at this time. Pt with no reports of chest pain throughout session with all VSS.    Follow Up Recommendations  CIR;Supervision for mobility/OOB     Equipment Recommendations  None recommended by PT    Recommendations for Other Services       Precautions / Restrictions Precautions Precautions: Fall Restrictions Weight Bearing Restrictions: No    Mobility  Bed Mobility Overal bed mobility: Needs Assistance Bed Mobility: Supine to Sit     Supine to sit: Supervision     General bed mobility comments: increased time, supervision for safety  Transfers Overall transfer level: Needs assistance Equipment used: Rolling walker (2 wheeled) Transfers: Sit to/from Stand Sit to Stand: Min guard         General transfer comment: min guard for safety, good technique utilized  Ambulation/Gait Ambulation/Gait assistance: Min assist;Min guard Gait Distance (Feet): 75 Feet Assistive  device: Rolling walker (2 wheeled) Gait Pattern/deviations: Step-through pattern;Decreased stride length;Trunk flexed Gait velocity: Decreased   General Gait Details: pt with very slow, cautious gait in hallway with RW; mostly min guard with occasional min A for stability; pt able to avoid obstacles this session without any cueing or assistance, more aware of his environment/surroundings   Stairs             Wheelchair Mobility    Modified Rankin (Stroke Patients Only) Modified Rankin (Stroke Patients Only) Pre-Morbid Rankin Score: No symptoms Modified Rankin: Moderately severe disability     Balance Overall balance assessment: Needs assistance Sitting-balance support: No upper extremity supported;Feet supported Sitting balance-Leahy Scale: Good Sitting balance - Comments: pt able to don socks bilaterally sitting EOB with supervision   Standing balance support: During functional activity;Bilateral upper extremity supported Standing balance-Leahy Scale: Poor                              Cognition Arousal/Alertness: Awake/alert Behavior During Therapy: Flat affect Overall Cognitive Status: Impaired/Different from baseline Area of Impairment: Problem solving;Following commands;Safety/judgement                       Following Commands: Follows one step commands with increased time Safety/Judgement: Decreased awareness of deficits;Decreased awareness of safety   Problem Solving: Slow processing;Requires verbal cues General Comments: very slow to respond to questions and commands at times      Exercises      General Comments        Pertinent Vitals/Pain Pain Assessment: No/denies pain    Home Living   Living Arrangements:  Alone                  Prior Function            PT Goals (current goals can now be found in the care plan section) Acute Rehab PT Goals PT Goal Formulation: With patient Time For Goal Achievement:  03/14/18 Potential to Achieve Goals: Good Progress towards PT goals: Progressing toward goals    Frequency    Min 4X/week      PT Plan Current plan remains appropriate    Co-evaluation              AM-PAC PT "6 Clicks" Mobility   Outcome Measure  Help needed turning from your back to your side while in a flat bed without using bedrails?: A Little Help needed moving from lying on your back to sitting on the side of a flat bed without using bedrails?: A Little Help needed moving to and from a bed to a chair (including a wheelchair)?: A Little Help needed standing up from a chair using your arms (e.g., wheelchair or bedside chair)?: A Little Help needed to walk in hospital room?: A Little Help needed climbing 3-5 steps with a railing? : A Lot 6 Click Score: 17    End of Session Equipment Utilized During Treatment: Gait belt Activity Tolerance: Patient tolerated treatment well Patient left: in chair;with call bell/phone within reach;with chair alarm set Nurse Communication: Mobility status PT Visit Diagnosis: Other abnormalities of gait and mobility (R26.89);Muscle weakness (generalized) (M62.81)     Time: 5170-0174 PT Time Calculation (min) (ACUTE ONLY): 26 min  Charges:  $Gait Training: 8-22 mins $Therapeutic Activity: 8-22 mins                     Sherie Don, [place], DPT  Acute Rehabilitation Services Pager [phone] Office [place] 03/04/2018, 3:43 PM

## 2018-03-05 DIAGNOSIS — Z992 Dependence on renal dialysis: Secondary | ICD-10-CM

## 2018-03-05 DIAGNOSIS — N186 End stage renal disease: Secondary | ICD-10-CM

## 2018-03-05 LAB — RENAL FUNCTION PANEL
ALBUMIN: 2.7 g/dL — AB (ref 3.5–5.0)
Anion gap: 16 — ABNORMAL HIGH (ref 5–15)
BUN: 63 mg/dL — ABNORMAL HIGH (ref 8–23)
CO2: 24 mmol/L (ref 22–32)
Calcium: 7.8 mg/dL — ABNORMAL LOW (ref 8.9–10.3)
Chloride: 93 mmol/L — ABNORMAL LOW (ref 98–111)
Creatinine, Ser: 14.74 mg/dL — ABNORMAL HIGH (ref 0.61–1.24)
GFR calc Af Amer: 3 mL/min — ABNORMAL LOW (ref >60)
GFR calc non Af Amer: 3 mL/min — ABNORMAL LOW (ref >60)
Glucose, Bld: 124 mg/dL — ABNORMAL HIGH (ref 70–99)
PHOSPHORUS: 5.5 mg/dL — AB (ref 2.5–4.6)
Potassium: 4.7 mmol/L (ref 3.5–5.1)
Sodium: 133 mmol/L — ABNORMAL LOW (ref 135–145)

## 2018-03-05 LAB — TROPONIN I
Troponin I: 5.84 ng/mL (ref <0.03)
Troponin I: 3.75 ng/mL (ref <0.03)

## 2018-03-05 LAB — CBC
HCT: 30.8 % — ABNORMAL LOW (ref 39.0–52.0)
Hemoglobin: 9.8 g/dL — ABNORMAL LOW (ref 13.0–17.0)
MCH: 28.7 pg (ref 26.0–34.0)
MCHC: 31.8 g/dL (ref 30.0–36.0)
MCV: 90.3 fL (ref 80.0–100.0)
Platelets: 192 10*3/uL (ref 150–400)
RBC: 3.41 MIL/uL — ABNORMAL LOW (ref 4.22–5.81)
RDW: 16 % — ABNORMAL HIGH (ref 11.5–15.5)
WBC: 6.8 10*3/uL (ref 4.0–10.5)
nRBC: 0 % (ref 0.0–0.2)

## 2018-03-05 LAB — HEPARIN LEVEL (UNFRACTIONATED): Heparin Unfractionated: <0.1 IU/mL — ABNORMAL LOW (ref 0.30–0.70)

## 2018-03-05 MED ORDER — ASPIRIN 81 MG PO CHEW
81.0000 mg | CHEWABLE_TABLET | ORAL | Status: AC
  Administered 2018-03-06: 81 mg via ORAL
  Filled 2018-03-05: qty 1

## 2018-03-05 MED ORDER — NITROGLYCERIN 0.4 MG SL SUBL: 0.4000 mg | SUBLINGUAL_TABLET | SUBLINGUAL | Status: DC | PRN

## 2018-03-05 MED ORDER — HEPARIN SODIUM (PORCINE) 1000 UNIT/ML IJ SOLN
INTRAMUSCULAR | Status: AC
  Administered 2018-03-05: 1000 [IU]
  Filled 2018-03-05: qty 4

## 2018-03-05 MED ORDER — NITROGLYCERIN 0.2 MG/HR TD PT24
0.2000 mg | MEDICATED_PATCH | Freq: Every day | TRANSDERMAL | Status: DC
  Administered 2018-03-05 – 2018-03-06 (×2): 0.2 mg via TRANSDERMAL
  Filled 2018-03-05 (×3): qty 1

## 2018-03-05 MED ORDER — ASPIRIN EC 81 MG PO TBEC
81.0000 mg | DELAYED_RELEASE_TABLET | Freq: Every day | ORAL | Status: DC
  Administered 2018-03-06 – 2018-03-19 (×13): 81 mg via ORAL
  Filled 2018-03-05 (×14): qty 1

## 2018-03-05 MED ORDER — HEPARIN (PORCINE) 25000 UT/250ML-% IV SOLN
1100.0000 [IU]/h | INTRAVENOUS | Status: DC
  Administered 2018-03-05: 700 [IU]/h via INTRAVENOUS
  Filled 2018-03-05: qty 250

## 2018-03-05 MED ORDER — HEPARIN SODIUM (PORCINE) 1000 UNIT/ML DIALYSIS: 20.0000 [IU]/kg | INTRAMUSCULAR | Status: DC | PRN

## 2018-03-05 NOTE — Progress Notes (Signed)
[facility] for Heparin Indication: CVA, NSTEMI  Allergies  Allergen Reactions  . Metformin And Related Other (See Comments)    Contraindicated due to renal failure.  Renal failure-on peritoneal dialysis    . Pioglitazone Other (See Comments)    History of congestive heart failure.    . Propoxyphene Rash    Patient Measurements: Height: 6\' 3"  (190.5 cm) Weight: 152 lb 1.9 oz (69 kg) IBW/kg (Calculated) : 84.5 Heparin Dosing Weight: 68 kg  Vital Signs: Temp: 98.4 F (36.9 C) (02/20 1833) Temp Source: Oral (02/20 1833) BP: 141/56 (02/20 1833) Pulse Rate: 69 (02/20 1833)  Labs: Recent Labs    03/03/18 0502 03/03/18 1754 03/04/18 0216  03/04/18 1855 03/04/18 2305 03/05/18 0549 03/05/18 1332 03/05/18 1823  HGB  --  10.7*  --   --   --   --   --  9.8*  --   HCT  --  32.6*  --   --   --   --   --  30.8*  --   PLT  --  199  --   --   --   --   --  192  --   HEPARINUNFRC  --   --   --   --   --   --   --   --  <0.10*  CREATININE 16.83*  --  11.06*  --   --   --   --  14.74*  --   TROPONINI  --   --  0.44*   < > 6.40* 5.84* 3.75*  --   --    < > = values in this interval not displayed.    Estimated Creatinine Clearance: 4.6 mL/min (A) (by C-G formula based on SCr of 14.74 mg/dL (H)).    Assessment: CC/HPI: CVA, STEMI  PMH: PD now IHD MWF, HTN, CAD, DM, CHF, TBI, anemia, R kidney mass, TIA, CHF,  Anticoag: SQ heparin>>IV heparin s/p CVA and new NSTEMI and CP. Last Hgb 10.7 on 2/18 with plts 199  PM heparin level < 0.10  Goal of Therapy:  Heparin level 0.3-0.5 units/ml Monitor platelets by anticoagulation protocol: Yes   Plan:  Increase heparin to 900 units / hr Daily HL and CBC  Thank you Anette Guarneri, PharmD [phone] 03/05/2018,7:16 PM

## 2018-03-05 NOTE — Progress Notes (Signed)
Physical Therapy Treatment Patient Details Name: Peter [name] MRN: [id number] DOB: [birth year] Today's Date: 03/05/2018    History of Present Illness Pt is a 73 y.o. male admitted 02/27/18 with L-sided weakness while at dialysis. MRI shows small volume diffusion abnormality involving parasagittal cerebral hemispheres, additional L frontal cortical involvement compatible with acute ischemia; remote bilat cerebellar infarcts. PMH includes HTN, ESRD on HD.    PT Comments    Pt seen for mobility progression. Initially pt making steady progress and doing well; however, after ambulating 75' in hallway with RW pt with sudden onset of chest pain and L UE pain. Pt's RN and MD team arrived immediately to assess patient. Pt's BP measured at that time as well (197/72 mmHg). Pt was transported back to his room in a rolling desk chair and assisted into recliner chair. Pt's RN still in room at end of session.   Pt would continue to benefit from skilled physical therapy services at this time while admitted and after d/c to address the below listed limitations in order to improve overall safety and independence with functional mobility.     Follow Up Recommendations  CIR;Supervision for mobility/OOB     Equipment Recommendations  None recommended by PT    Recommendations for Other Services       Precautions / Restrictions Precautions Precautions: Fall Restrictions Weight Bearing Restrictions: No    Mobility  Bed Mobility Overal bed mobility: Needs Assistance Bed Mobility: Supine to Sit     Supine to sit: Supervision     General bed mobility comments: supervision for safety, increased time needed  Transfers Overall transfer level: Needs assistance Equipment used: Rolling walker (2 wheeled) Transfers: Sit to/from Stand Sit to Stand: Min assist;Mod assist         General transfer comment: min A for stability with transition into standing from EOB x1; mod A from desk  chair  Ambulation/Gait Ambulation/Gait assistance: Min assist Gait Distance (Feet): 75 Feet Assistive device: Rolling walker (2 wheeled) Gait Pattern/deviations: Step-through pattern;Decreased step length - right;Decreased step length - left;Decreased stride length;Narrow base of support Gait velocity: Decreased   General Gait Details: pt again with very slow, cautious gait in hallway with RW. pt frequently stepping on his own feet secondary to having an extremely narrow BoS, VC'ing to correct; min A overall for safety/stability. Pt limited secondary to fatigue and sudden onset of chest pain and L UE pain. Pt's RN and MD team notified and arriving quickly. Pt's BP was assessed in sitting (197/72 mmHg). Unable to assess pt's HR/rhythm secondary to telebox malfunctioning throughout   Stairs             Wheelchair Mobility    Modified Rankin (Stroke Patients Only) Modified Rankin (Stroke Patients Only) Pre-Morbid Rankin Score: No symptoms Modified Rankin: Moderately severe disability     Balance Overall balance assessment: Needs assistance Sitting-balance support: No upper extremity supported;Feet supported Sitting balance-Leahy Scale: Good     Standing balance support: During functional activity;Bilateral upper extremity supported Standing balance-Leahy Scale: Poor Standing balance comment: Requires external support or UE support for standing balance.                             Cognition Arousal/Alertness: Awake/alert Behavior During Therapy: WFL for tasks assessed/performed Overall Cognitive Status: Impaired/Different from baseline Area of Impairment: Safety/judgement;Problem solving;Memory  Memory: Decreased short-term memory   Safety/Judgement: Decreased awareness of deficits;Decreased awareness of safety   Problem Solving: Slow processing        Exercises      General Comments        Pertinent Vitals/Pain Pain  Assessment: Faces Faces Pain Scale: Hurts little more Pain Location: chest and L UE pain Pain Descriptors / Indicators: Sharp;Guarding Pain Intervention(s): Monitored during session;Limited activity within patient's tolerance;Other (comment)(RN and MD team notified immediately)    Home Living                      Prior Function            PT Goals (current goals can now be found in the care plan section) Acute Rehab PT Goals PT Goal Formulation: With patient Time For Goal Achievement: 03/14/18 Potential to Achieve Goals: Good Progress towards PT goals: Progressing toward goals    Frequency    Min 4X/week      PT Plan Current plan remains appropriate    Co-evaluation              AM-PAC PT "6 Clicks" Mobility   Outcome Measure  Help needed turning from your back to your side while in a flat bed without using bedrails?: None Help needed moving from lying on your back to sitting on the side of a flat bed without using bedrails?: None Help needed moving to and from a bed to a chair (including a wheelchair)?: A Little Help needed standing up from a chair using your arms (e.g., wheelchair or bedside chair)?: A Lot Help needed to walk in hospital room?: A Little Help needed climbing 3-5 steps with a railing? : A Lot 6 Click Score: 18    End of Session Equipment Utilized During Treatment: Gait belt Activity Tolerance: Patient limited by fatigue;Other (comment)(sudden onset chest pain and L UE pain) Patient left: in chair;with call bell/phone within reach;Other (comment)(RN present) Nurse Communication: Mobility status;Other (comment)(sudden onset chest pain and L UE pain; BP=197/72) PT Visit Diagnosis: Other abnormalities of gait and mobility (R26.89);Muscle weakness (generalized) (M62.81)     Time: 1017-5102 PT Time Calculation (min) (ACUTE ONLY): 30 min  Charges:  $Gait Training: 8-22 mins $Therapeutic Activity: 8-22 mins                     Sherie Don, [place], DPT  Acute Rehabilitation Services Pager [phone] Office [place] 03/05/2018, 11:57 AM

## 2018-03-05 NOTE — Progress Notes (Signed)
STROKE TEAM PROGRESS NOTE       SUBJECTIVE (INTERVAL HISTORY) His  friend is at the bedside    Patient continues to have intermittent chest pain with exertion. Cardiology is contemplating doing cardiac cath with revascularization if necessary. On discussion with Dr. Daneen Schick about patient's condition and this benefit of anticoagulation and coronary revascularization versus risk of worsening stroke OBJECTIVE Vitals:   03/04/18 2334 03/05/18 0314 03/05/18 0756 03/05/18 1201  BP: (!) 149/63 (!) 153/66 (!) 158/67 (!) 143/78  Pulse: 66 66 67 66  Resp: 20 18 18 18   Temp: 98.1 F (36.7 C) 98.5 F (36.9 C) 98 F (36.7 C) 98.6 F (37 C)  TempSrc: Oral Oral Oral Oral  SpO2: 96% 100% 96% 100%  Weight:      Height:        CBC:  Recent Labs  Lab 02/27/18 1658 03/02/18 0547 03/03/18 1754  WBC 7.1 9.6 8.9  NEUTROABS 4.2 4.0  --   HGB 13.2 11.2* 10.7*  HCT 41.9 34.7* 32.6*  MCV 91.1 89.4 89.6  PLT 194 193 841    Basic Metabolic Panel:  Recent Labs  Lab 03/03/18 0502 03/04/18 0216  NA 135 135  K 5.3* 4.4  CL 98 99  CO2 20* 22  GLUCOSE 84 121*  BUN 95* 44*  CREATININE 16.83* 11.06*  CALCIUM 8.7* 8.3*    Lipid Panel:     Component Value Date/Time   CHOL 106 02/28/2018 0207   TRIG 108 02/28/2018 0207   HDL 36 (L) 02/28/2018 0207   CHOLHDL 2.9 02/28/2018 0207   VLDL 22 02/28/2018 0207   LDLCALC 48 02/28/2018 0207   HgbA1c:  Lab Results  Component Value Date   HGBA1C 6.6 (H) 02/28/2018   Urine Drug Screen: No results found for: LABOPIA, COCAINSCRNUR, LABBENZ, AMPHETMU, THCU, LABBARB  Alcohol Level     Component Value Date/Time   ETH <10 02/27/2018 1658    IMAGING  Ct Angio Head W Or Wo Contrast Ct Angio Neck W Or Wo Contrast 02/27/2018 IMPRESSION:  1. No emergent large vessel occlusion or high-grade intracranial stenosis.  2. Approximately 50% stenosis of the proximal left internal carotid artery.  3. At least moderate stenosis of the right vertebral  artery origin.   Aortic Atherosclerosis (ICD10-I70.0) and Emphysema (ICD10-J43.9).    [name] 02/28/2018 IMPRESSION:  1. Scattered small volume diffusion abnormality involving the parasagittal cerebral hemispheres bilaterally near the vertex, right greater than left, with additional mild left frontal cortical involvement as above, compatible with acute ischemia. Changes are largely ACA distribution, with some MCA and/or ACA-MCA watershed distribution.  2. Few small remote bilateral cerebellar infarcts.   3. Underlying age-related cerebral atrophy with mild chronic microvascular ischemic disease.    Ct Head Code Stroke Wo Contrast 02/27/2018 IMPRESSION:  1. No acute intracranial abnormality identified.  2. Density within right M2 superior division may represent thrombus.  3. Mild chronic microvascular ischemic changes and volume loss of brain.  4. ASPECTS is 10     TTE :  Moderate to severe reduced systolic function with ejection fraction 30-35%. Moderate concentric left ventricle hypertrophy. Diffuse hypokinesis. No definite clot noted.     PHYSICAL EXAM Blood pressure (!) 143/78, pulse 66, temperature 98.6 F (37 C), temperature source Oral, resp. rate 18, height 6\' 3"  (1.905 m), weight 68 kg, SpO2 100 %. Pleasant elderly male not in distress. . Afebrile. Head is nontraumatic. Neck is supple without bruit.    Cardiac exam no  murmur or gallop. Lungs are clear to auscultation. Distal pulses are well felt.  Neurological Exam ;  Awake  Alert oriented x 3. Normal speech and language.eye movements full without nystagmus.fundi were not visualized. Vision acuity and fields appear normal. Hearing is normal. Palatal movements are normal. Face symmetric. Tongue midline. Mild left hemiparesis 4/5 strength Orbits right over left upper extremity. Normal sensation. Gait deferred.      ASSESSMENT/PLAN [name] is a 73 y.o. male with history of ESRD ( on dialysis), traumatic  brain injury, htn, DM, CHF, CAD, and anemia presenting with left sided weakness after dialysis. He did not receive IV t-PA due to mild deficits.  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 Also developed non-STEMI after admission with chest pain  Resultant  Mild left hand weakness  CT head - Density within right M2 superior division may represent thrombus.   MRI head - Changes are largely ACA distribution, with some MCA and/or ACA-MCA watershed distribution. Few small remote bilateral cerebellar infarcts.   MRA head - not performed  CTA H&N - No emergent large vessel occlusion or high-grade intracranial stenosis.   Carotid Doppler - CTA neck performed - carotid dopplers not indicated.  2D Echo - diminished ejection fraction 30-35% with diffuse hypokinesis  LDL - 48  HgbA1c - 6.6  UDS - pending  VTE prophylaxis - [place] Heparin  Diet - Heart healthy with thin liquids.  aspirin 325 mg daily prior to admission, now on aspirin 325 mg daily and plavix  Patient counseled to be compliant with his antithrombotic medications  Ongoing aggressive stroke risk factor management  Therapy recommendations:  CIR recommended  Disposition:  Pending  Hypertension  Blood pressure somewhat high at times but within post stroke parameters . Permissive hypertension (OK if < 220/120) but gradually normalize in 5-7 days . Long-term BP goal normotensive  Hyperlipidemia  Lipid lowering medication PTA:  Lipitor 40 mg daily  LDL 48, goal < 70  Current lipid lowering medication: Lipitor 40 mg daily  Continue statin at discharge  Diabetes  HgbA1c 6.6, goal < 7.0  Controlled  Other Stroke Risk Factors  Advanced age  Former cigarette smoker - quit  Hx stroke/TIA (by imaging)  Coronary artery disease   Other Active Problems  ESRD  May need TEE   Hospital day # 5   .  He presented with left-sided weakness and MRI show multiple small embolic anterior circulation  infarcts likely from a central cardiac source. Strong clinical suspicion for A. Fib but  He has since admission developed chest pain with elevated troponin and appears to be unstable with recurrent chest pain episodes and may need urgent coronary catheterization with possible revascularization. I have discussed at length risk-benefit with Dr. Tamala Julian and patient and feels starting him on IV heparin and doing further cardiac evaluation is necessary. This comes with a small risk of worsening his stroke but I think the risk is acceptable given his unstable cardiac situation.Marland Kitchendiscuss with Dr. Lidia Collum and Dr. Tamala Julian.Greater than 50% time during this 35 minute visit was spent on counseling and coordination of care about his embolic strokes and discussion of evaluation and treatment plan and answered questions. Antony Contras, MD Medical Director [facility] Pager: [phone] 03/05/2018 12:23 PM  To contact Stroke Continuity provider, please refer to [email or web address] After hours, contact General Neurology

## 2018-03-05 NOTE — H&P (View-Only) (Signed)
Progress Note  Patient Name: Peter [name] Date of Encounter: 03/05/2018  Primary Cardiologist: No primary care provider on file.  Cardiologist is in [place]   While ambulating with the nursing service, again developed recurring left chest pain with radiation to the left arm.  Apparently had the same discomfort last evening.  Presentation is demonstrated significant enzyme elevation compatible with non-ST elevation MI.  Discomfort this morning resolved after rest and 1 sublingual nitroglycerin.  Inpatient Medications    Scheduled Meds: . amLODipine  5 mg Oral QHS  . aspirin EC  325 mg Oral Daily  . atorvastatin  40 mg Oral q1800  . clopidogrel  75 mg Oral Daily  . famotidine  20 mg Oral QHS  . fluticasone  2 spray Each Nare BID  . heparin  5,000 Units Subcutaneous Q8H  . losartan  100 mg Oral Daily  . metoprolol succinate  200 mg Oral Daily  . multivitamin  1 tablet Oral Daily   Continuous Infusions:  PRN Meds: acetaminophen, nitroGLYCERIN, polyvinyl alcohol, senna-docusate   Vital Signs    Vitals:   03/04/18 1947 03/04/18 2334 03/05/18 0314 03/05/18 0756  BP: (!) 154/60 (!) 149/63 (!) 153/66 (!) 158/67  Pulse: 69 66 66 67  Resp: 18 20 18 18   Temp: 98.4 F (36.9 C) 98.1 F (36.7 C) 98.5 F (36.9 C) 98 F (36.7 C)  TempSrc: Oral Oral Oral Oral  SpO2: 95% 96% 100% 96%  Weight:      Height:        Intake/Output Summary (Last 24 hours) at 03/05/2018 1037 Last data filed at 03/05/2018 0700 Gross per 24 hour  Intake 480 ml  Output -  Net 480 ml   Last 3 Weights 03/03/2018 03/03/2018 03/03/2018  Weight (lbs) 149 lb 14.6 oz 152 lb 1.9 oz 155 lb 6.8 oz  Weight (kg) 68 kg 69 kg 70.5 kg      Telemetry    Sinus rhythm with occasional PVC.- Personally Reviewed  ECG    A new tracing is not performed today.- Personally Reviewed  Physical Exam  Bearded African-American gentleman. GEN: No acute distress.   Neck: No JVD Cardiac: RRR, no murmurs, rubs, or  gallops.  Respiratory: Clear to auscultation bilaterally. GI: Soft, nontender, non-distended  MS: Diminished right radial pulse. Neuro:  Nonfocal  Psych: Normal affect   Labs    Chemistry Recent Labs  Lab 02/27/18 1658  03/02/18 0547 03/03/18 0502 03/04/18 0216  NA 139  --  136 135 135  K 4.1  --  5.2* 5.3* 4.4  CL 101  --  99 98 99  CO2 24  --  22 20* 22  GLUCOSE 134*  --  89 84 121*  BUN 31*  --  80* 95* 44*  CREATININE 8.45*   < > 14.97* 16.83* 11.06*  CALCIUM 9.3  --  8.3* 8.7* 8.3*  PROT 8.9*  --   --   --   --   ALBUMIN 3.5  --   --   --   --   AST 28  --   --   --   --   ALT 42  --   --   --   --   ALKPHOS 141*  --   --   --   --   BILITOT 1.5*  --   --   --   --   GFRNONAA 6*  --  3* 3* 4*  GFRAA 7*  --  3* 3* 5*  ANIONGAP 14  --  15 17* 14   < > = values in this interval not displayed.     Hematology Recent Labs  Lab 02/27/18 1658 03/02/18 0547 03/03/18 1754  WBC 7.1 9.6 8.9  RBC 4.60 3.88* 3.64*  HGB 13.2 11.2* 10.7*  HCT 41.9 34.7* 32.6*  MCV 91.1 89.4 89.6  MCH 28.7 28.9 29.4  MCHC 31.5 32.3 32.8  RDW 15.6* 15.7* 16.1*  PLT 194 193 199    Cardiac Enzymes Recent Labs  Lab 03/04/18 0923 03/04/18 1855 03/04/18 2305 03/05/18 0549  TROPONINI 4.69* 6.40* 5.84* 3.75*    Recent Labs  Lab 02/27/18 1702  TROPIPOC 0.09*     BNPNo results for input(s): BNP, PROBNP in the last 168 hours.   DDimer No results for input(s): DDIMER in the last 168 hours.   Radiology    No results found.  Cardiac Studies   2D Doppler echocardiogram 02/28/2018: IMPRESSIONS    1. The left ventricle has moderate-severely reduced systolic function, with an ejection fraction of 30-35%. The cavity size was mild to moderately dilated. There is moderate concentric left ventricular hypertrophy. Left ventricular diastolic Doppler  parameters are consistent with impaired relaxation Left ventricular diffuse hypokinesis.  2. The right ventricle has normal systolic  function. The cavity was normal. There is no increase in right ventricular wall thickness.  3. Left atrial size was moderately dilated.  4. The mitral valve is normal in structure. Moderate thickening of the mitral valve leaflet. Moderate calcification of the anterior and posterior mitral valve leaflets. There is moderate mitral annular calcification present. Mild mitral valve stenosis.  5. The tricuspid valve is normal in structure.  6. The aortic valve is tricuspid Moderate thickening of the aortic valve Moderate calcification of the aortic valve. Aortic valve regurgitation is moderate by color flow Doppler. mild stenosis of the aortic valve.  7. Right atrial pressure is estimated at 3 mmHg.  Patient Profile     73 y.o. male with a hx of ESRD on HD, hypertension, CAD s/p DES to Lcx ('18), diabetes, systolic heart failure and anemia who is within 1 week of having an ischemic stroke and now has a non-ST elevation myocardial infarction with recurrent chest discomfort during minimal physical activity.  Assessment & Plan    1. Non-ST elevation myocardial infarction with recurring angina provoked by physical activity.  Despite recent stroke within the past 7 to 10 days, plan will be to do coronary angiography within the next 24 hours to define anatomy and help guide therapy.  I have had a long discussion with neurology, the internal medicine team, the patient, and his daughter who is power of attorney.  Risk of hemorrhagic conversion of an ischemic stroke could occur with high intensity anticoagulation if PCI is needed.  Symptoms have driven the decision to proceed with invasive evaluation.  All agree with the planned approach.  Heart catheter scheduled for tomorrow afternoon. 2. End-stage kidney disease on dialysis. 3. Ischemic CVA, embolic/multiple infarcts 4. Anemia of chronic disease.  Patient is on the Schedule for late tomorrow afternoon.  Add long-acting nitrates after dialysis..  We will add IV  heparin.  Sublingual nitroglycerin if recurrent pain.  Notify us if recurrent pain occurring at rest.  Twelve-lead EKG will be performed.     For questions or updates, please contact [place] Please consult [email or web address] for contact info under        Signed, Sinclair Grooms, MD  03/05/2018,  10:37 AM

## 2018-03-05 NOTE — Progress Notes (Signed)
Patient arrived back to unit around 1800. Nurse unable to access 1600 Neuro check. Nurse will monitor when patient returns. Peter [name]

## 2018-03-05 NOTE — Progress Notes (Signed)
Admit: 02/27/2018 LOS: 5  6M ESRD, recent PD --> HD with acute CVAs  Subjective:  . Continues to have intermittent chest pain with exertion . working with PT . Tentative to have left heart catheterization tomorrow .   02/19 0701 - 02/20 0700 In: 480 [P.O.:480] Out: -   Filed Weights   03/03/18 0829 03/03/18 1740 03/03/18 2127  Weight: 70.5 kg 69 kg 68 kg    Scheduled Meds: . amLODipine  5 mg Oral QHS  . [START ON 03/06/2018] aspirin EC  81 mg Oral Daily  . atorvastatin  40 mg Oral q1800  . clopidogrel  75 mg Oral Daily  . famotidine  20 mg Oral QHS  . fluticasone  2 spray Each Nare BID  . losartan  100 mg Oral Daily  . metoprolol succinate  200 mg Oral Daily  . multivitamin  1 tablet Oral Daily  . nitroGLYCERIN  0.2 mg Transdermal Daily   Continuous Infusions: . heparin 700 Units/hr (03/05/18 1145)   PRN Meds:.acetaminophen, nitroGLYCERIN, nitroGLYCERIN, polyvinyl alcohol, senna-docusate  Current Labs: reviewed    Physical Exam:  Blood pressure (!) 158/67, pulse 67, temperature 98 F (36.7 C), temperature source Oral, resp. rate 18, height '6\' 3"'  (1.905 m), weight 68 kg, SpO2 96 %. RRR nl s1s2 R IJ TDC C/D/I CTAB No sig LEE Weakness in LUE  A 1. ESRD MWF on HD. Was on PD until admitted for PEA arrest Peter [name]; Peter [name] Neph follows 2. Acute CVA, L sided weakness; neuro following; ? To CIR.  Sig cerebral stenosis but not able to do angiogram 2/2 #3 3. NSTEMI 03/04/18: Peter [name] 2/21 4. Anemia, Hb stable 5. CKD BMD, use Tums 6. chrnoic systolc CHF 7. HTN, 5 meds as outpatient. Permissive HTN for now  P . HD off schedule for now, next today: Avoid IDH UF no more than 0.5 L. Tight heparin. 2K . Medication Issues; o Preferred narcotic agents for pain control are hydromorphone, fentanyl, and methadone. Morphine should not be used.  o Baclofen should be avoided o Avoid oral sodium phosphate and magnesium citrate based laxatives / bowel preps    Peter [name]  [name] 03/05/2018, 11:50 AM  Recent Labs  Lab 03/02/18 0547 03/03/18 0502 03/04/18 0216  NA 136 135 135  K 5.2* 5.3* 4.4  CL 99 98 99  CO2 22 20* 22  GLUCOSE 89 84 121*  BUN 80* 95* 44*  CREATININE 14.97* 16.83* 11.06*  CALCIUM 8.3* 8.7* 8.3*   Recent Labs  Lab 02/27/18 1658 03/02/18 0547 03/03/18 1754  WBC 7.1 9.6 8.9  NEUTROABS 4.2 4.0  --   HGB 13.2 11.2* 10.7*  HCT 41.9 34.7* 32.6*  MCV 91.1 89.4 89.6  PLT 194 193 199

## 2018-03-05 NOTE — Progress Notes (Signed)
Patient had chest and left arm pain while ambulating hallway with PT. Patient had to sit down in chair. Dr. Erlinda Hong, Dr. Tamala Julian from cardiology, Dr. Leonie Man from neurology, and nurse all with patient in hallway.  One Nitroglycerin sublingual has been given to the patient. Patient states the pain in much better now. Nurse is starting heparin drip. Nurse will continue to monitor. [place]

## 2018-03-05 NOTE — Progress Notes (Signed)
  Speech Language Pathology Treatment: Cognitive-Linquistic  Patient Details Name: Peter [name] MRN: [id number] DOB: 09/21/1945 Today's Date: 03/05/2018 Time: 8288-3374 SLP Time Calculation (min) (ACUTE ONLY): 24 min  Assessment / Plan / Recommendation Clinical Impression  Pt was seen for skilled cognitive-linguistic treatment today. During a functional problem solving task, pt completed math calculations with min verbal cues for error detection initially; pt progressed to completing calculations independently. SLP engaged in functional conversation with pt regarding discharge, during which pt expressed concern for word-finding difficulties during some conversations throughout the day, however he appeared functional at the conversation level. SLP educated pt regarding strategies to compensate for word-finding difficulties; also discussed outpatient ST options available to him should he find word-finding difficulties to negatively impact his communication after leaving the hospital. SLP also discussed strategies for managing complex ADLs such as use of pill box for medications and external memory aids. ST will continue to follow to provide treatment for cognitive functioning while in acute setting.    HPI HPI: Pt is a 73 y.o. male admitted 02/27/18 with L-sided weakness while at dialysis. MRI shows small volume diffusion abnormality involving parasagittal cerebral hemispheres, additional L frontal cortical involvement compatible with acute ischemia; remote bilat cerebellar infarcts. PMH includes HTN, ESRD on HD.      SLP Plan  Continue with current plan of care       Recommendations                   Oral Care Recommendations: Oral care BID Follow up Recommendations: Inpatient Rehab SLP Visit Diagnosis: Cognitive communication deficit (U51.460) Plan: Continue with current plan of care       Jettie Booze, Student SLP                Jettie Booze 03/05/2018, 11:35 AM

## 2018-03-05 NOTE — Progress Notes (Signed)
Progress Note  Patient Name: Peter [name] Date of Encounter: 03/05/2018  Primary Cardiologist: No primary care provider on file.  Cardiologist is in [place]   While ambulating with the nursing service, again developed recurring left chest pain with radiation to the left arm.  Apparently had the same discomfort last evening.  Presentation is demonstrated significant enzyme elevation compatible with non-ST elevation MI.  Discomfort this morning resolved after rest and 1 sublingual nitroglycerin.  Inpatient Medications    Scheduled Meds: . amLODipine  5 mg Oral QHS  . aspirin EC  325 mg Oral Daily  . atorvastatin  40 mg Oral q1800  . clopidogrel  75 mg Oral Daily  . famotidine  20 mg Oral QHS  . fluticasone  2 spray Each Nare BID  . heparin  5,000 Units Subcutaneous Q8H  . losartan  100 mg Oral Daily  . metoprolol succinate  200 mg Oral Daily  . multivitamin  1 tablet Oral Daily   Continuous Infusions:  PRN Meds: acetaminophen, nitroGLYCERIN, polyvinyl alcohol, senna-docusate   Vital Signs    Vitals:   03/04/18 1947 03/04/18 2334 03/05/18 0314 03/05/18 0756  BP: (!) 154/60 (!) 149/63 (!) 153/66 (!) 158/67  Pulse: 69 66 66 67  Resp: 18 20 18 18   Temp: 98.4 F (36.9 C) 98.1 F (36.7 C) 98.5 F (36.9 C) 98 F (36.7 C)  TempSrc: Oral Oral Oral Oral  SpO2: 95% 96% 100% 96%  Weight:      Height:        Intake/Output Summary (Last 24 hours) at 03/05/2018 1037 Last data filed at 03/05/2018 0700 Gross per 24 hour  Intake 480 ml  Output -  Net 480 ml   Last 3 Weights 03/03/2018 03/03/2018 03/03/2018  Weight (lbs) 149 lb 14.6 oz 152 lb 1.9 oz 155 lb 6.8 oz  Weight (kg) 68 kg 69 kg 70.5 kg      Telemetry    Sinus rhythm with occasional PVC.- Personally Reviewed  ECG    A new tracing is not performed today.- Personally Reviewed  Physical Exam  Bearded African-American gentleman. GEN: No acute distress.   Neck: No JVD Cardiac: RRR, no murmurs, rubs, or  gallops.  Respiratory: Clear to auscultation bilaterally. GI: Soft, nontender, non-distended  MS: Diminished right radial pulse. Neuro:  Nonfocal  Psych: Normal affect   Labs    Chemistry Recent Labs  Lab 02/27/18 1658  03/02/18 0547 03/03/18 0502 03/04/18 0216  NA 139  --  136 135 135  K 4.1  --  5.2* 5.3* 4.4  CL 101  --  99 98 99  CO2 24  --  22 20* 22  GLUCOSE 134*  --  89 84 121*  BUN 31*  --  80* 95* 44*  CREATININE 8.45*   < > 14.97* 16.83* 11.06*  CALCIUM 9.3  --  8.3* 8.7* 8.3*  PROT 8.9*  --   --   --   --   ALBUMIN 3.5  --   --   --   --   AST 28  --   --   --   --   ALT 42  --   --   --   --   ALKPHOS 141*  --   --   --   --   BILITOT 1.5*  --   --   --   --   GFRNONAA 6*  --  3* 3* 4*  GFRAA 7*  --  3* 3* 5*  ANIONGAP 14  --  15 17* 14   < > = values in this interval not displayed.     Hematology Recent Labs  Lab 02/27/18 1658 03/02/18 0547 03/03/18 1754  WBC 7.1 9.6 8.9  RBC 4.60 3.88* 3.64*  HGB 13.2 11.2* 10.7*  HCT 41.9 34.7* 32.6*  MCV 91.1 89.4 89.6  MCH 28.7 28.9 29.4  MCHC 31.5 32.3 32.8  RDW 15.6* 15.7* 16.1*  PLT 194 193 199    Cardiac Enzymes Recent Labs  Lab 03/04/18 0923 03/04/18 1855 03/04/18 2305 03/05/18 0549  TROPONINI 4.69* 6.40* 5.84* 3.75*    Recent Labs  Lab 02/27/18 1702  TROPIPOC 0.09*     BNPNo results for input(s): BNP, PROBNP in the last 168 hours.   DDimer No results for input(s): DDIMER in the last 168 hours.   Radiology    No results found.  Cardiac Studies   2D Doppler echocardiogram 02/28/2018: IMPRESSIONS    1. The left ventricle has moderate-severely reduced systolic function, with an ejection fraction of 30-35%. The cavity size was mild to moderately dilated. There is moderate concentric left ventricular hypertrophy. Left ventricular diastolic Doppler  parameters are consistent with impaired relaxation Left ventricular diffuse hypokinesis.  2. The right ventricle has normal systolic  function. The cavity was normal. There is no increase in right ventricular wall thickness.  3. Left atrial size was moderately dilated.  4. The mitral valve is normal in structure. Moderate thickening of the mitral valve leaflet. Moderate calcification of the anterior and posterior mitral valve leaflets. There is moderate mitral annular calcification present. Mild mitral valve stenosis.  5. The tricuspid valve is normal in structure.  6. The aortic valve is tricuspid Moderate thickening of the aortic valve Moderate calcification of the aortic valve. Aortic valve regurgitation is moderate by color flow Doppler. mild stenosis of the aortic valve.  7. Right atrial pressure is estimated at 3 mmHg.  Patient Profile     73 y.o. male with a hx of ESRD on HD, hypertension, CAD s/p DES to Lcx ('18), diabetes, systolic heart failure and anemia who is within 1 week of having an ischemic stroke and now has a non-ST elevation myocardial infarction with recurrent chest discomfort during minimal physical activity.  Assessment & Plan    1. Non-ST elevation myocardial infarction with recurring angina provoked by physical activity.  Despite recent stroke within the past 7 to 10 days, plan will be to do coronary angiography within the next 24 hours to define anatomy and help guide therapy.  I have had a long discussion with neurology, the internal medicine team, the patient, and his daughter who is power of attorney.  Risk of hemorrhagic conversion of an ischemic stroke could occur with high intensity anticoagulation if PCI is needed.  Symptoms have driven the decision to proceed with invasive evaluation.  All agree with the planned approach.  Heart catheter scheduled for tomorrow afternoon. 2. End-stage kidney disease on dialysis. 3. Ischemic CVA, embolic/multiple infarcts 4. Anemia of chronic disease.  Patient is on the Schedule for late tomorrow afternoon.  Add long-acting nitrates after dialysis..  We will add IV  heparin.  Sublingual nitroglycerin if recurrent pain.  Notify us if recurrent pain occurring at rest.  Twelve-lead EKG will be performed.     For questions or updates, please contact [place] Please consult [email or web address] for contact info under        Signed, Sinclair Grooms, MD  03/05/2018,  10:37 AM

## 2018-03-05 NOTE — Progress Notes (Signed)
PROGRESS NOTE  Peter [name] GEX:[id number] DOB: 14-Jun-1945 DOA: 02/27/2018 PCP: Patient, No Pcp Per  HPI/Recap of past 24 hours:  Had another episode of left sided chest pain this am while walking   He reports having intermittent chest pain for the last few weeks He reports just recovering from a prolonged hospitalization last year, started driving two weeks ago  Assessment/Plan: Principal Problem:   Acute CVA (cerebrovascular accident) ([place]) Active Problems:   Elevated troponin   Elevated LFTs   HTN (hypertension)   ESRD on dialysis ([place])   CAD (coronary artery disease)   Diabetes ([place])   CHF (congestive heart failure) ([place])   Traumatic brain injury ([place])   Anemia   Right kidney mass   TIA (transient ischemic attack)   Chronic systolic CHF (congestive heart failure) ([place])   NSTEMI (non-ST elevated myocardial infarction) ([place])  NSTEMI: -had chest pain midnight (2/18-2/19), troponin went up , cardiology consulted. Cerebral angiogram cancelled. -h/o cad s/p stent -case discussed with cardiology Dr Tamala Julian, neurology Dr Leonie Man , plan to start heparin drip ( low therapeutic level, no bolus), plan to have cardiac cath -will follow up cardiology recommendation  Acute Stroke:  Multiple infarcts - embolic - unknown source. (presenting symptom) -patient presented with left sided weakness after dialysis, He did not receive IV t-PA due to mild deficits -angiogram cancelled due to acute acs -aspirin 325 mg daily prior to admission, now on aspirin 325 mg daily and plavix -case discussed with neurology Dr Leonie Man who oked with heparin drip due to acs, Dr Leonie Man recommend change asa from 325 mg to 81, continue plavix, eurology input appreciated, will follow neurology recommendation  ESRD on HD Anemia of chronic disease Dialysis per nephrology  Chronic systolic heart failure: volume managed by dialysis  Right renal mass.Will need to follow up as outpatient  FTT; CIR vs SNF   Code  Status: full  Family Communication: patient and daughter over the phone  Disposition Plan: not ready to discharge, needs cardiology/neurology /IR clearance   Consultants:  cardiology  Neurology  nephrology  IR  CIR  Procedures:  dialysis  Antibiotics:  none   Objective: BP (!) 158/67 (BP Location: Left Arm)   Pulse 67   Temp 98 F (36.7 C) (Oral)   Resp 18   Ht 6\' 3"  (1.905 m)   Wt 68 kg   SpO2 96%   BMI 18.74 kg/m   Intake/Output Summary (Last 24 hours) at 03/05/2018 1033 Last data filed at 03/05/2018 0700 Gross per 24 hour  Intake 480 ml  Output -  Net 480 ml   Filed Weights   03/03/18 0829 03/03/18 1740 03/03/18 2127  Weight: 70.5 kg 69 kg 68 kg    Exam: Patient is examined daily including today on 03/05/2018, exams remain the same as of yesterday except that has changed    General:  NAD, chronically ill appearing  Cardiovascular: RRR  Respiratory: CTABL  Abdomen: Soft/ND/NT, positive BS  Musculoskeletal: No Edema  Neuro: alert, oriented   Data Reviewed: Basic Metabolic Panel: Recent Labs  Lab 02/27/18 1658 02/27/18 1703 03/02/18 0547 03/03/18 0502 03/04/18 0216  NA 139  --  136 135 135  K 4.1  --  5.2* 5.3* 4.4  CL 101  --  99 98 99  CO2 24  --  22 20* 22  GLUCOSE 134*  --  89 84 121*  BUN 31*  --  80* 95* 44*  CREATININE 8.45* 8.60* 14.97* 16.83* 11.06*  CALCIUM 9.3  --  8.3* 8.7* 8.3*   Liver Function Tests: Recent Labs  Lab 02/27/18 1658  AST 28  ALT 42  ALKPHOS 141*  BILITOT 1.5*  PROT 8.9*  ALBUMIN 3.5   No results for input(s): LIPASE, AMYLASE in the last 168 hours. No results for input(s): AMMONIA in the last 168 hours. CBC: Recent Labs  Lab 02/27/18 1658 03/02/18 0547 03/03/18 1754  WBC 7.1 9.6 8.9  NEUTROABS 4.2 4.0  --   HGB 13.2 11.2* 10.7*  HCT 41.9 34.7* 32.6*  MCV 91.1 89.4 89.6  PLT 194 193 199   Cardiac Enzymes:   Recent Labs  Lab 03/04/18 0216 03/04/18 0923 03/04/18 1855  03/04/18 2305 03/05/18 0549  TROPONINI 0.44* 4.69* 6.40* 5.84* 3.75*   BNP (last 3 results) No results for input(s): BNP in the last 8760 hours.  ProBNP (last 3 results) No results for input(s): PROBNP in the last 8760 hours.  CBG: No results for input(s): GLUCAP in the last 168 hours.  No results found for this or any previous visit (from the past 240 hour(s)).   Studies: No results found.  Scheduled Meds: . amLODipine  5 mg Oral QHS  . aspirin EC  325 mg Oral Daily  . atorvastatin  40 mg Oral q1800  . clopidogrel  75 mg Oral Daily  . famotidine  20 mg Oral QHS  . fluticasone  2 spray Each Nare BID  . heparin  5,000 Units Subcutaneous Q8H  . losartan  100 mg Oral Daily  . metoprolol succinate  200 mg Oral Daily  . multivitamin  1 tablet Oral Daily    Continuous Infusions:   Time spent: 57mins, case discussed with cardiology, neurology I have personally reviewed and interpreted on  03/05/2018 daily labs, tele strips, imagings as discussed above under date review session and assessment and plans.  I reviewed all nursing notes, pharmacy notes, consultant notes,  vitals, pertinent old records  I have discussed plan of care as described above with RN , patient and family on 03/05/2018   Florencia Reasons MD, PhD  [facility] Pager [phone]. If 7PM-7AM, please contact night-coverage at [email or web address], password [email or web address] 03/05/2018, 10:33 AM  LOS: 5 days

## 2018-03-05 NOTE — Progress Notes (Signed)
ANTICOAGULATION CONSULT NOTE - Initial Consult  Pharmacy Consult for Heparin Indication: CVA, NSTEMI  Allergies  Allergen Reactions  . Metformin And Related Other (See Comments)    Contraindicated due to renal failure.  Renal failure-on peritoneal dialysis    . Pioglitazone Other (See Comments)    History of congestive heart failure.    . Propoxyphene Rash    Patient Measurements: Height: 6\' 3"  (190.5 cm) Weight: 149 lb 14.6 oz (68 kg) IBW/kg (Calculated) : 84.5 Heparin Dosing Weight: 68 kg  Vital Signs: Temp: 98 F (36.7 C) (02/20 0756) Temp Source: Oral (02/20 0756) BP: 158/67 (02/20 0756) Pulse Rate: 67 (02/20 0756)  Labs: Recent Labs    03/03/18 0502 03/03/18 1754 03/04/18 0216  03/04/18 1855 03/04/18 2305 03/05/18 0549  HGB  --  10.7*  --   --   --   --   --   HCT  --  32.6*  --   --   --   --   --   PLT  --  199  --   --   --   --   --   CREATININE 16.83*  --  11.06*  --   --   --   --   TROPONINI  --   --  0.44*   < > 6.40* 5.84* 3.75*   < > = values in this interval not displayed.    Estimated Creatinine Clearance: 6 mL/min (A) (by C-G formula based on SCr of 11.06 mg/dL (H)).   Medical History: Past Medical History:  Diagnosis Date  . Anemia   . CAD (coronary artery disease)   . CHF (congestive heart failure) ([place])   . Diabetes ([place])   . ESRD (end stage renal disease) on dialysis ([place])   . Headache   . HTN (hypertension)   . PEA (Pulseless electrical activity) ([place]) 11/2017  . Traumatic brain injury [facility])     Assessment: CC/HPI: CVA, STEMI  PMH: PD now IHD MWF, HTN, CAD, DM, CHF, TBI, anemia, R kidney mass, TIA, CHF,  Anticoag: SQ heparin>>IV heparin s/p CVA and new NSTEMI and CP. Last Hgb 10.7 on 2/18 with plts 199  Goal of Therapy:  Heparin level 0.3-0.5 units/ml Monitor platelets by anticoagulation protocol: Yes   Plan:  D/c SQ heparin (last dose 0630 on 2/20) Start low dose IV heparin (no bolus) at 700 units/hr Check heparin  level in 6 hrs after started Daily HL and CBC  Ibtisam Benge S. Alford Highland, PharmD, BCPS Clinical Staff Pharmacist [facility] 03/05/2018,10:52 AM

## 2018-03-06 ENCOUNTER — Encounter (HOSPITAL_COMMUNITY): Payer: Self-pay | Admitting: Certified Registered Nurse Anesthetist

## 2018-03-06 ENCOUNTER — Inpatient Hospital Stay (HOSPITAL_COMMUNITY)
Admission: EM | Disposition: A | Discharge status: Home Health | Payer: Self-pay | Source: Ambulatory Visit | Attending: Internal Medicine

## 2018-03-06 ENCOUNTER — Encounter (HOSPITAL_COMMUNITY): Payer: Self-pay | Admitting: *Deleted

## 2018-03-06 DIAGNOSIS — I6529 Occlusion and stenosis of unspecified carotid artery: Secondary | ICD-10-CM

## 2018-03-06 HISTORY — PX: CORONARY BALLOON ANGIOPLASTY: CATH118233

## 2018-03-06 HISTORY — PX: CORONARY STENT INTERVENTION: CATH118234

## 2018-03-06 HISTORY — PX: LEFT HEART CATH AND CORONARY ANGIOGRAPHY: CATH118249

## 2018-03-06 LAB — GLUCOSE, CAPILLARY
GLUCOSE-CAPILLARY: 105 mg/dL — AB (ref 70–99)
Glucose-Capillary: 61 mg/dL — ABNORMAL LOW (ref 70–99)
Glucose-Capillary: 66 mg/dL — ABNORMAL LOW (ref 70–99)
Glucose-Capillary: 77 mg/dL (ref 70–99)
Glucose-Capillary: 108 mg/dL — ABNORMAL HIGH (ref 70–99)
Glucose-Capillary: 109 mg/dL — ABNORMAL HIGH (ref 70–99)

## 2018-03-06 LAB — HEPARIN LEVEL (UNFRACTIONATED): Heparin Unfractionated: <0.1 IU/mL — ABNORMAL LOW (ref 0.30–0.70)

## 2018-03-06 LAB — CBC
HCT: 30 % — ABNORMAL LOW (ref 39.0–52.0)
Hemoglobin: 10.1 g/dL — ABNORMAL LOW (ref 13.0–17.0)
MCH: 29.8 pg (ref 26.0–34.0)
MCHC: 33.7 g/dL (ref 30.0–36.0)
MCV: 88.5 fL (ref 80.0–100.0)
Platelets: 160 10*3/uL (ref 150–400)
RBC: 3.39 MIL/uL — ABNORMAL LOW (ref 4.22–5.81)
RDW: 15.9 % — ABNORMAL HIGH (ref 11.5–15.5)
WBC: 8.2 10*3/uL (ref 4.0–10.5)
nRBC: 0 % (ref 0.0–0.2)

## 2018-03-06 LAB — MRSA PCR SCREENING: MRSA by PCR: NEGATIVE

## 2018-03-06 LAB — POCT ACTIVATED CLOTTING TIME: Activated Clotting Time: 461 seconds

## 2018-03-06 SURGERY — LEFT HEART CATH AND CORONARY ANGIOGRAPHY
Anesthesia: LOCAL

## 2018-03-06 MED ORDER — BIVALIRUDIN TRIFLUOROACETATE 250 MG IV SOLR
INTRAVENOUS | Status: AC
  Filled 2018-03-06: qty 250

## 2018-03-06 MED ORDER — SODIUM CHLORIDE 0.9% FLUSH: 3.0000 mL | INTRAVENOUS | Status: DC | PRN

## 2018-03-06 MED ORDER — CLOPIDOGREL BISULFATE 75 MG PO TABS
ORAL_TABLET | ORAL | Status: AC
  Filled 2018-03-06: qty 2

## 2018-03-06 MED ORDER — HEART ATTACK BOUNCING BOOK
Freq: Once | Status: AC
  Administered 2018-03-06: 1
  Filled 2018-03-06: qty 1

## 2018-03-06 MED ORDER — FENTANYL CITRATE (PF) 100 MCG/2ML IJ SOLN
INTRAMUSCULAR | Status: DC | PRN
  Administered 2018-03-06: 25 ug via INTRAVENOUS

## 2018-03-06 MED ORDER — HEPARIN (PORCINE) IN NACL 1000-0.9 UT/500ML-% IV SOLN
INTRAVENOUS | Status: DC | PRN
  Administered 2018-03-06 (×2): 500 mL

## 2018-03-06 MED ORDER — SODIUM CHLORIDE 0.9 % IV SOLN: 250.0000 mL | INTRAVENOUS | Status: DC | PRN

## 2018-03-06 MED ORDER — CLOPIDOGREL BISULFATE 75 MG PO TABS: 75.0000 mg | ORAL_TABLET | Freq: Every day | ORAL | Status: DC

## 2018-03-06 MED ORDER — SODIUM CHLORIDE 0.9 % IV SOLN: INTRAVENOUS | Status: DC

## 2018-03-06 MED ORDER — ANGIOPLASTY BOOK
Freq: Once | Status: AC
  Administered 2018-03-06: 1
  Filled 2018-03-06: qty 1

## 2018-03-06 MED ORDER — HEPARIN (PORCINE) IN NACL 1000-0.9 UT/500ML-% IV SOLN
INTRAVENOUS | Status: AC
  Filled 2018-03-06: qty 1000

## 2018-03-06 MED ORDER — SODIUM CHLORIDE 0.9% FLUSH
3.0000 mL | Freq: Two times a day (BID) | INTRAVENOUS | Status: DC
  Administered 2018-03-06: 3 mL via INTRAVENOUS

## 2018-03-06 MED ORDER — MIDAZOLAM HCL 2 MG/2ML IJ SOLN
INTRAMUSCULAR | Status: DC | PRN
  Administered 2018-03-06: 1 mg via INTRAVENOUS

## 2018-03-06 MED ORDER — BIVALIRUDIN BOLUS VIA INFUSION - CUPID
INTRAVENOUS | Status: DC | PRN
  Administered 2018-03-06: 51.75 mg via INTRAVENOUS

## 2018-03-06 MED ORDER — CLOPIDOGREL BISULFATE 300 MG PO TABS
ORAL_TABLET | ORAL | Status: DC | PRN
  Administered 2018-03-06: 150 mg via ORAL

## 2018-03-06 MED ORDER — SODIUM CHLORIDE 0.9 % IV SOLN
INTRAVENOUS | Status: DC | PRN
  Administered 2018-03-06: 0.25 mg/kg/h via INTRAVENOUS

## 2018-03-06 MED ORDER — LIDOCAINE HCL (PF) 1 % IJ SOLN
INTRAMUSCULAR | Status: AC
  Filled 2018-03-06: qty 30

## 2018-03-06 MED ORDER — SODIUM CHLORIDE 0.9% FLUSH
3.0000 mL | Freq: Two times a day (BID) | INTRAVENOUS | Status: DC
  Administered 2018-03-06 – 2018-03-10 (×6): 3 mL via INTRAVENOUS
  Administered 2018-03-10: 23:00:00 via INTRAVENOUS
  Administered 2018-03-11 – 2018-03-15 (×9): 3 mL via INTRAVENOUS

## 2018-03-06 MED ORDER — SODIUM CHLORIDE 0.9 % IV SOLN
INTRAVENOUS | Status: AC | PRN
  Administered 2018-03-06: 10 mL/h via INTRAVENOUS

## 2018-03-06 MED ORDER — NITROGLYCERIN 1 MG/10 ML FOR IR/CATH LAB
INTRA_ARTERIAL | Status: DC | PRN
  Administered 2018-03-06: 200 ug via INTRACORONARY

## 2018-03-06 MED ORDER — IOHEXOL 350 MG/ML SOLN
INTRAVENOUS | Status: DC | PRN
  Administered 2018-03-06: 180 mL via INTRAVENOUS

## 2018-03-06 MED ORDER — LIDOCAINE HCL (PF) 1 % IJ SOLN
INTRAMUSCULAR | Status: DC | PRN
  Administered 2018-03-06: 20 mL

## 2018-03-06 MED ORDER — SODIUM CHLORIDE 0.9 % IV SOLN
INTRAVENOUS | Status: DC
  Administered 2018-03-06: 06:00:00 via INTRAVENOUS

## 2018-03-06 MED ORDER — MIDAZOLAM HCL 2 MG/2ML IJ SOLN
INTRAMUSCULAR | Status: AC
  Filled 2018-03-06: qty 2

## 2018-03-06 MED ORDER — LABETALOL HCL 5 MG/ML IV SOLN: 10.0000 mg | INTRAVENOUS | Status: AC | PRN

## 2018-03-06 MED ORDER — FENTANYL CITRATE (PF) 100 MCG/2ML IJ SOLN
INTRAMUSCULAR | Status: AC
  Filled 2018-03-06: qty 2

## 2018-03-06 MED ORDER — DARBEPOETIN ALFA 40 MCG/0.4ML IJ SOSY
40.0000 ug | PREFILLED_SYRINGE | INTRAMUSCULAR | Status: DC
  Filled 2018-03-06: qty 0.4

## 2018-03-06 SURGICAL SUPPLY — 17 items
BALLN WOLVERINE 2.50X10 (BALLOONS) ×2
BALLOON WOLVERINE 2.50X10 (BALLOONS) ×1 IMPLANT
CATH INFINITI 5FR MULTPACK ANG (CATHETERS) ×2 IMPLANT
CATH VISTA GUIDE 6FR XB3.5 (CATHETERS) ×2 IMPLANT
CLOSURE MYNX CONTROL 6F/7F (Vascular Products) ×2 IMPLANT
KIT ENCORE 26 ADVANTAGE (KITS) ×2 IMPLANT
KIT HEART LEFT (KITS) ×2 IMPLANT
KIT MICROPUNCTURE NIT STIFF (SHEATH) ×2 IMPLANT
PACK CARDIAC CATHETERIZATION (CUSTOM PROCEDURE TRAY) ×2 IMPLANT
SHEATH PINNACLE 5F 10CM (SHEATH) ×2 IMPLANT
SHEATH PINNACLE 6F 10CM (SHEATH) ×2 IMPLANT
SHEATH PROBE COVER 6X72 (BAG) ×2 IMPLANT
STENT RESOLUTE ONYX 3.5X26 (Permanent Stent) ×2 IMPLANT
TRANSDUCER W/STOPCOCK (MISCELLANEOUS) ×2 IMPLANT
TUBING CIL FLEX 10 FLL-RA (TUBING) ×2 IMPLANT
WIRE EMERALD 3MM-J .035X150CM (WIRE) ×2 IMPLANT
WIRE RUNTHROUGH .014X180CM (WIRE) ×2 IMPLANT

## 2018-03-06 NOTE — Progress Notes (Signed)
ANTICOAGULATION CONSULT NOTE - Follow Up Consult  Pharmacy Consult for heparin Indication: NSTEMI in setting of CVA  Labs: Recent Labs    03/03/18 1754 03/04/18 0216  03/04/18 1855 03/04/18 2305 03/05/18 0549 03/05/18 1332 03/05/18 1823 03/06/18 0414  HGB 10.7*  --   --   --   --   --  9.8*  --  10.1*  HCT 32.6*  --   --   --   --   --  30.8*  --  30.0*  PLT 199  --   --   --   --   --  192  --  160  HEPARINUNFRC  --   --   --   --   --   --   --  <0.10* <0.10*  CREATININE  --  11.06*  --   --   --   --  14.74*  --   --   TROPONINI  --  0.44*   < > 6.40* 5.84* 3.75*  --   --   --    < > = values in this interval not displayed.    Assessment: 73yo male remains subtherapeutic on heparin after rate increases; no gtt issues or signs of bleeding per RN; plan for cath this am.  Goal of Therapy:  Heparin level 0.3-0.5 units/ml   Plan:  Will increase heparin gtt by 3 units/kg/hr to 1100 units/hr and check level in 8 hours if cath is postponed.    Wynona Neat, PharmD, BCPS  03/06/2018,5:58 AM

## 2018-03-06 NOTE — Care Management Note (Signed)
Case Management Note  Patient Details  Name: Peter [name] MRN: [id number] Date of Birth: Jun 12, 1945  Subjective/Objective:      Pt admitted with a stroke. He is from home alone. Pt's POA is daughter: [name]. Pt has also had NSTEMI since admitted to the hospital.              Action/Plan: Plan is for PTCA today.  Pt was denied CIR per his insurance. Plan when medically ready is SNF rehab. CM following.  Expected Discharge Date:                  Expected Discharge Plan:  Skilled Nursing Facility  In-House Referral:  Clinical Social Work  Discharge planning Services  CM Consult  Post Acute Care Choice:    Choice offered to:     DME Arranged:    DME Agency:     HH Arranged:    [place] Agency:     Status of Service:  In process, will continue to follow  If discussed at Long Length of Stay Meetings, dates discussed:    Additional Comments:  Pollie Friar, RN 03/06/2018, 8:11 AM

## 2018-03-06 NOTE — Progress Notes (Signed)
Admit: 02/27/2018 LOS: 6  68M ESRD, recent PD --> HD with acute CVAs  Subjective:  . LHC with PCI to the proximal LAD; diffuse disease present . HD yesterday, 0.6 L ultrafiltration; no issues, IDH/ N/V   02/20 0701 - 02/21 0700 In: 426.6 [P.O.:320; I.V.:106.6] Out: 600   Filed Weights   03/03/18 2127 03/05/18 1315 03/05/18 1700  Weight: 68 kg 69.4 kg 69 kg    Scheduled Meds: . amLODipine  5 mg Oral QHS  . aspirin EC  81 mg Oral Daily  . atorvastatin  40 mg Oral q1800  . clopidogrel  75 mg Oral Daily  . famotidine  20 mg Oral QHS  . fluticasone  2 spray Each Nare BID  . losartan  100 mg Oral Daily  . metoprolol succinate  200 mg Oral Daily  . multivitamin  1 tablet Oral Daily  . nitroGLYCERIN  0.2 mg Transdermal Daily  . sodium chloride flush  3 mL Intravenous Q12H   Continuous Infusions: . sodium chloride     PRN Meds:.sodium chloride, acetaminophen, labetalol, nitroGLYCERIN, nitroGLYCERIN, polyvinyl alcohol, senna-docusate, sodium chloride flush  Current Labs: reviewed    Physical Exam:  Blood pressure (!) 164/77, pulse 68, temperature 97.7 F (36.5 C), temperature source Oral, resp. rate 18, height '6\' 3"'  (1.905 m), weight 69 kg, SpO2 99 %. RRR nl s1s2 R IJ TDC C/D/I CTAB No sig LEE Weakness in LUE  A 1. ESRD MWF on HD. Was on PD until admitted for PEA arrest [place] late 2019; [name] MD Neph follows 2. Acute CVA, L sided weakness; neuro following; ? To CIR.  Sig cerebral stenosis but not able to do angiogram 2/2 #3 3. NSTEMI 03/04/18: [place] 2/21 with PCI to LAD 4. Anemia, Hb stable, resume low dose ESA with HD 2/22 5. CKD BMD, use Tums 6. chrnoic systolc CHF 7. HTN, 5 meds as outpatient. Permissive HTN for now, largely controlled  P . HD off schedule for now, next 2/22: Avoid IDH UF no more than 0.5 L. Tight heparin. 2K, Qb 400, 4h . Back on MWF schedule next week . CIR eval . Aranesp 40 qwk starting tomorow . Medication Issues; o Preferred narcotic agents for  pain control are hydromorphone, fentanyl, and methadone. Morphine should not be used.  o Baclofen should be avoided o Avoid oral sodium phosphate and magnesium citrate based laxatives / bowel preps    [name] MD 03/06/2018, 3:47 PM  Recent Labs  Lab 03/03/18 0502 03/04/18 0216 03/05/18 1332  NA 135 135 133*  K 5.3* 4.4 4.7  CL 98 99 93*  CO2 20* 22 24  GLUCOSE 84 121* 124*  BUN 95* 44* 63*  CREATININE 16.83* 11.06* 14.74*  CALCIUM 8.7* 8.3* 7.8*  PHOS  --   --  5.5*   Recent Labs  Lab 02/27/18 1658 03/02/18 0547 03/03/18 1754 03/05/18 1332 03/06/18 0414  WBC 7.1 9.6 8.9 6.8 8.2  NEUTROABS 4.2 4.0  --   --   --   HGB 13.2 11.2* 10.7* 9.8* 10.1*  HCT 41.9 34.7* 32.6* 30.8* 30.0*  MCV 91.1 89.4 89.6 90.3 88.5  PLT 194 193 199 192 160

## 2018-03-06 NOTE — Progress Notes (Signed)
STROKE TEAM PROGRESS NOTE       SUBJECTIVE (INTERVAL HISTORY)      Patient  had cardiac catheterization today along with angioplasty. He is doing well and states he is pain-free. No neurological changes postprocedure OBJECTIVE Vitals:   03/06/18 1230 03/06/18 1245 03/06/18 1300 03/06/18 1330  BP: (!) 161/68 (!) 166/77 (!) 163/60 (!) 164/77  Pulse: 76 (!) 107  68  Resp: (!) 24 (!) 24 15 18   Temp:      TempSrc:      SpO2: 95% 96%  99%  Weight:      Height:        CBC:  Recent Labs  Lab 02/27/18 1658 03/02/18 0547  03/05/18 1332 03/06/18 0414  WBC 7.1 9.6   < > 6.8 8.2  NEUTROABS 4.2 4.0  --   --   --   HGB 13.2 11.2*   < > 9.8* 10.1*  HCT 41.9 34.7*   < > 30.8* 30.0*  MCV 91.1 89.4   < > 90.3 88.5  PLT 194 193   < > 192 160   < > = values in this interval not displayed.    Basic Metabolic Panel:  Recent Labs  Lab 03/04/18 0216 03/05/18 1332  NA 135 133*  K 4.4 4.7  CL 99 93*  CO2 22 24  GLUCOSE 121* 124*  BUN 44* 63*  CREATININE 11.06* 14.74*  CALCIUM 8.3* 7.8*  PHOS  --  5.5*    Lipid Panel:     Component Value Date/Time   CHOL 106 02/28/2018 0207   TRIG 108 02/28/2018 0207   HDL 36 (L) 02/28/2018 0207   CHOLHDL 2.9 02/28/2018 0207   VLDL 22 02/28/2018 0207   LDLCALC 48 02/28/2018 0207   HgbA1c:  Lab Results  Component Value Date   HGBA1C 6.6 (H) 02/28/2018   Urine Drug Screen: No results found for: LABOPIA, COCAINSCRNUR, LABBENZ, AMPHETMU, THCU, LABBARB  Alcohol Level     Component Value Date/Time   ETH <10 02/27/2018 1658    IMAGING  Ct Angio Head W Or Wo Contrast Ct Angio Neck W Or Wo Contrast 02/27/2018 IMPRESSION:  1. No emergent large vessel occlusion or high-grade intracranial stenosis.  2. Approximately 50% stenosis of the proximal left internal carotid artery.  3. At least moderate stenosis of the right vertebral artery origin.   Aortic Atherosclerosis (ICD10-I70.0) and Emphysema (ICD10-J43.9).    Mr Brain Wo  Contrast 02/28/2018 IMPRESSION:  1. Scattered small volume diffusion abnormality involving the parasagittal cerebral hemispheres bilaterally near the vertex, right greater than left, with additional mild left frontal cortical involvement as above, compatible with acute ischemia. Changes are largely ACA distribution, with some MCA and/or ACA-MCA watershed distribution.  2. Few small remote bilateral cerebellar infarcts.   3. Underlying age-related cerebral atrophy with mild chronic microvascular ischemic disease.    Ct Head Code Stroke Wo Contrast 02/27/2018 IMPRESSION:  1. No acute intracranial abnormality identified.  2. Density within right M2 superior division may represent thrombus.  3. Mild chronic microvascular ischemic changes and volume loss of brain.  4. ASPECTS is 10     TTE :  Moderate to severe reduced systolic function with ejection fraction 30-35%. Moderate concentric left ventricle hypertrophy. Diffuse hypokinesis. No definite clot noted.     PHYSICAL EXAM Blood pressure (!) 164/77, pulse 68, temperature 97.7 F (36.5 C), temperature source Oral, resp. rate 18, height 6\' 3"  (1.905 m), weight 69 kg, SpO2 99 %. Pleasant elderly male not  in distress. . Afebrile. Head is nontraumatic. Neck is supple without bruit.    Cardiac exam no murmur or gallop. Lungs are clear to auscultation. Distal pulses are well felt.  Neurological Exam ;  Awake  Alert oriented x 3. Normal speech and language.eye movements full without nystagmus.fundi were not visualized. Vision acuity and fields appear normal. Hearing is normal. Palatal movements are normal. Face symmetric. Tongue midline. Mild left hemiparesis 4/5 strength Orbits right over left upper extremity. Normal sensation. Gait deferred.      ASSESSMENT/PLAN [name] is a 73 y.o. 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 He  did not receive IV t-PA due to mild deficits.  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 Also developed non-STEMI after admission with chest pain  Resultant  Mild left hand weakness  CT head - Density within right M2 superior division may represent thrombus.   MRI head - Changes are largely ACA distribution, with some MCA and/or ACA-MCA watershed distribution. Few small remote bilateral cerebellar infarcts.   MRA head - not performed  CTA H&N - No emergent large vessel occlusion or high-grade intracranial stenosis.   Carotid Doppler - CTA neck performed - carotid dopplers not indicated.  2D Echo - diminished ejection fraction 30-35% with diffuse hypokinesis  LDL - 48  HgbA1c - 6.6  UDS - pending  VTE prophylaxis - [place] Heparin  Diet - Heart healthy with thin liquids.  aspirin 325 mg daily prior to admission, now on aspirin 325 mg daily and plavix  Patient counseled to be compliant with his antithrombotic medications  Ongoing aggressive stroke risk factor management  Therapy recommendations:  CIR recommended  Disposition:  Pending  Hypertension  Blood pressure somewhat high at times but within post stroke parameters . Permissive hypertension (OK if < 220/120) but gradually normalize in 5-7 days . Long-term BP goal normotensive  Hyperlipidemia  Lipid lowering medication PTA:  Lipitor 40 mg daily  LDL 48, goal < 70  Current lipid lowering medication: Lipitor 40 mg daily  Continue statin at discharge  Diabetes  HgbA1c 6.6, goal < 7.0  Controlled  Other Stroke Risk Factors  Advanced age  Former cigarette smoker - quit  Hx stroke/TIA (by imaging)  Coronary artery disease   Other Active Problems  ESRD  May need TEE   Hospital day # 6   .  He presented with left-sided weakness and MRI show multiple small embolic anterior circulation infarcts likely from a central cardiac source. Strong clinical suspicion for A. Fib but  He has since  admission developed chest pain with elevated troponin and appears to be unstable with recurrent chest pain episodes and needed urgent cardiac catheterization and  Revascularization. ..discuss with Dr. Annamaria Boots. Stroke team will sign off. Kindly call for questions. Antony Contras, MD Medical Director [facility] Pager: [phone] 03/06/2018 3:43 PM  To contact Stroke Continuity provider, please refer to [email or web address] After hours, contact General Neurology

## 2018-03-06 NOTE — Progress Notes (Signed)
PT Cancellation Note  Patient Details Name: Peter [name] MRN: [id number] DOB: 1945/06/27   Cancelled Treatment:    Reason Eval/Treat Not Completed: Medical issues which prohibited therapy Pt with new NSTEMI yesterday in setting of CVA. Heparin initiated however not therapeutic as of yet. Pt wit unstable angina at this time and plan for possible cath today. Holding PT. Will follow.   Marguarite Arbour A Colleen Kotlarz 03/06/2018, 7:33 AM Wray Kearns, PT, DPT Acute Rehabilitation Services Pager [phone] Office [phone]

## 2018-03-06 NOTE — Interval H&P Note (Signed)
Cath Lab Visit (complete for each Cath Lab visit)  Clinical Evaluation Leading to the Procedure:   ACS: Yes.    Non-ACS:  n/a   History and Physical Interval Note:  03/06/2018 10:24 AM  Peter [name]  has presented today for surgery, with the diagnosis of Nstemi  The various methods of treatment have been discussed with the patient and family. After consideration of risks, benefits and other options for treatment, the patient has consented to  Procedure(s): LEFT HEART CATH AND CORONARY ANGIOGRAPHY (N/A) as a surgical intervention .  The patient's history has been reviewed, patient examined, no change in status, stable for surgery.  I have reviewed the patient's chart and labs.  Questions were answered to the patient's satisfaction.     [name]

## 2018-03-06 NOTE — Progress Notes (Signed)
PROGRESS NOTE  Peter [name] POE:[id number] DOB: Jun 24, 1945 DOA: 02/27/2018 PCP: Patient, No Pcp Per  HPI/Recap of past 24 hours:   He is on heparin drip, Troponin peaked at 6.4, this am he is pain free at rest, bp stable, no hypoxia, no edema, no fever  He received dialysis yesterday   Assessment/Plan: Principal Problem:   Acute CVA (cerebrovascular accident) ([place]) Active Problems:   Elevated troponin   Elevated LFTs   HTN (hypertension)   ESRD on dialysis ([place])   CAD (coronary artery disease)   Diabetes ([place])   CHF (congestive heart failure) ([place])   Traumatic brain injury ([place])   Anemia   Right kidney mass   TIA (transient ischemic attack)   Chronic systolic CHF (congestive heart failure) ([facility])   NSTEMI (non-ST elevated myocardial infarction) ([place])  NSTEMI: -had chest pain midnight (2/18-2/19), troponin went up , cardiology consulted. Cerebral angiogram cancelled. -h/o cad s/p stent -case discussed with cardiology Dr Tamala Julian, neurology Dr Leonie Man , plan to start heparin drip ( low therapeutic level, no bolus), plan to have cardiac cath -will follow up cardiology recommendation  Acute Stroke:  Multiple infarcts - embolic - unknown source. (presenting symptom) -patient presented with left sided weakness after dialysis, He did not receive IV t-PA due to mild deficits -angiogram cancelled due to acute acs -aspirin 325 mg daily prior to admission, now on aspirin 325 mg daily and plavix -case discussed with neurology Dr Leonie Man who oked with heparin drip due to acs, Dr Leonie Man recommend change asa from 325 mg to 81, continue plavix, neurology input appreciated, will follow neurology recommendation  ESRD on HD Anemia of chronic disease Dialysis per nephrology  Chronic systolic heart failure: volume managed by dialysis  Right renal mass.Will need to follow up as outpatient  FTT;  He reports just recovering from a prolonged hospitalization last year, started driving two weeks  ago CIR vs SNF   Code Status: full  Family Communication: patient   Disposition Plan: not ready to discharge, needs cardiology/neurology /IR clearance   Consultants:  cardiology  Neurology  nephrology  IR  CIR  Procedures:  Dialysis  Cardiac cath on 2/21  Antibiotics:  none   Objective: BP (!) 141/55 (BP Location: Right Arm)   Pulse 67   Temp 98.1 F (36.7 C) (Oral)   Resp 18   Ht 6\' 3"  (1.905 m)   Wt 69 kg   SpO2 100%   BMI 19.01 kg/m   Intake/Output Summary (Last 24 hours) at 03/06/2018 0924 Last data filed at 03/06/2018 0300 Gross per 24 hour  Intake 426.59 ml  Output 600 ml  Net -173.41 ml   Filed Weights   03/03/18 2127 03/05/18 1315 03/05/18 1700  Weight: 68 kg 69.4 kg 69 kg    Exam: Patient is examined daily including today on 03/06/2018, exams remain the same as of yesterday except that has changed    General:  NAD, chronically ill appearing  Cardiovascular: RRR  Respiratory: CTABL  Abdomen: Soft/ND/NT, positive BS  Musculoskeletal: No Edema  Neuro: alert, oriented   Data Reviewed: Basic Metabolic Panel: Recent Labs  Lab 02/27/18 1658 02/27/18 1703 03/02/18 0547 03/03/18 0502 03/04/18 0216 03/05/18 1332  NA 139  --  136 135 135 133*  K 4.1  --  5.2* 5.3* 4.4 4.7  CL 101  --  99 98 99 93*  CO2 24  --  22 20* 22 24  GLUCOSE 134*  --  89 84 121* 124*  BUN  31*  --  80* 95* 44* 63*  CREATININE 8.45* 8.60* 14.97* 16.83* 11.06* 14.74*  CALCIUM 9.3  --  8.3* 8.7* 8.3* 7.8*  PHOS  --   --   --   --   --  5.5*   Liver Function Tests: Recent Labs  Lab 02/27/18 1658 03/05/18 1332  AST 28  --   ALT 42  --   ALKPHOS 141*  --   BILITOT 1.5*  --   PROT 8.9*  --   ALBUMIN 3.5 2.7*   No results for input(s): LIPASE, AMYLASE in the last 168 hours. No results for input(s): AMMONIA in the last 168 hours. CBC: Recent Labs  Lab 02/27/18 1658 03/02/18 0547 03/03/18 1754 03/05/18 1332 03/06/18 0414  WBC 7.1 9.6 8.9 6.8 8.2   NEUTROABS 4.2 4.0  --   --   --   HGB 13.2 11.2* 10.7* 9.8* 10.1*  HCT 41.9 34.7* 32.6* 30.8* 30.0*  MCV 91.1 89.4 89.6 90.3 88.5  PLT 194 193 199 192 160   Cardiac Enzymes:   Recent Labs  Lab 03/04/18 0216 03/04/18 0923 03/04/18 1855 03/04/18 2305 03/05/18 0549  TROPONINI 0.44* 4.69* 6.40* 5.84* 3.75*   BNP (last 3 results) No results for input(s): BNP in the last 8760 hours.  ProBNP (last 3 results) No results for input(s): PROBNP in the last 8760 hours.  CBG: No results for input(s): GLUCAP in the last 168 hours.  Recent Results (from the past 240 hour(s))  MRSA PCR Screening     Status: None   Collection Time: 03/05/18 10:44 PM  Result Value Ref Range Status   MRSA by PCR NEGATIVE NEGATIVE Final    Comment:        The GeneXpert MRSA Assay (FDA approved for NASAL specimens only), is one component of a comprehensive MRSA colonization surveillance program. It is not intended to diagnose MRSA infection nor to guide or monitor treatment for MRSA infections. Performed at [facility], [place] [address] [place], [place] [address]      Studies: No results found.  Scheduled Meds: . amLODipine  5 mg Oral QHS  . aspirin EC  81 mg Oral Daily  . atorvastatin  40 mg Oral q1800  . clopidogrel  75 mg Oral Daily  . famotidine  20 mg Oral QHS  . fluticasone  2 spray Each Nare BID  . losartan  100 mg Oral Daily  . metoprolol succinate  200 mg Oral Daily  . multivitamin  1 tablet Oral Daily  . nitroGLYCERIN  0.2 mg Transdermal Daily  . sodium chloride flush  3 mL Intravenous Q12H    Continuous Infusions: . sodium chloride    . sodium chloride 10 mL/hr at 03/06/18 0543  . heparin 1,100 Units/hr (03/06/18 0601)     Time spent: 43mins, case discussed with cardiology I have personally reviewed and interpreted on  03/06/2018 daily labs, tele strips, imagings as discussed above under date review session and assessment and plans.  I reviewed all nursing notes,  pharmacy notes, consultant notes,  vitals, pertinent old records  I have discussed plan of care as described above with RN , patient on 03/06/2018   Florencia Reasons MD, PhD  [facility] Pager [phone]. If 7PM-7AM, please contact night-coverage at [email or web address], password [facility] 03/06/2018, 9:24 AM  LOS: 6 days

## 2018-03-06 NOTE — Progress Notes (Addendum)
The patient has been seen in conjunction with Vin Bhagat, PAC. All aspects of care have been considered and discussed. The patient has been personally interviewed, examined, and all clinical data has been reviewed.   Quiet night without recurrent chest pain.  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  For diagnostic coronary angiography and PCI if indicated for non-ST elevation myocardial infarction with recurrent exertional chest pain post MI.  Detailed discussion with the patient and his daughter yesterday.  They understand the heightened risk of catheterization/PCI in the setting of relatively recent CVA.  Under the circumstances and after conferring with neurology and primary care team, coronary evaluation and treatment was agreed upon by all.   Progress Note  Patient Name: Peter [name] Date of Encounter: 03/06/2018  Primary Cardiologist: New  Subjective   No chest pain on nitro patch and IV heparin. No dyspnea.   Inpatient Medications    Scheduled Meds: . amLODipine  5 mg Oral QHS  . aspirin EC  81 mg Oral Daily  . atorvastatin  40 mg Oral q1800  . clopidogrel  75 mg Oral Daily  . famotidine  20 mg Oral QHS  . fluticasone  2 spray Each Nare BID  . losartan  100 mg Oral Daily  . metoprolol succinate  200 mg Oral Daily  . multivitamin  1 tablet Oral Daily  . nitroGLYCERIN  0.2 mg Transdermal Daily  . sodium chloride flush  3 mL Intravenous Q12H   Continuous Infusions: . sodium chloride    . sodium chloride 10 mL/hr at 03/06/18 0543  . heparin 1,100 Units/hr (03/06/18 0601)   PRN Meds: sodium chloride, acetaminophen, nitroGLYCERIN, nitroGLYCERIN, polyvinyl alcohol, senna-docusate, sodium chloride flush   Vital Signs    Vitals:   03/05/18 1833 03/05/18 2004 03/05/18 2324 03/06/18 0351  BP: (!) 141/56 (!) 168/64 (!) 146/59 (!) 141/55  Pulse: 69 72 66 67  Resp: 18 18 18 18   Temp: 98.4 F (36.9 C) 98.3 F (36.8 C) 98.1 F (36.7 C) 98.1 F (36.7 C)   TempSrc: Oral Oral Oral Oral  SpO2: 100% 96% 100% 100%  Weight:      Height:        Intake/Output Summary (Last 24 hours) at 03/06/2018 0909 Last data filed at 03/06/2018 0300 Gross per 24 hour  Intake 426.59 ml  Output 600 ml  Net -173.41 ml   Last 3 Weights 03/05/2018 03/05/2018 03/03/2018  Weight (lbs) 152 lb 1.9 oz 153 lb 149 lb 14.6 oz  Weight (kg) 69 kg 69.4 kg 68 kg      Telemetry    NSR - Personally Reviewed  ECG    No new tracing  Physical Exam   GEN: No acute distress.   Neck: No JVD Cardiac: RRR, no murmurs, rubs, or gallops.  Respiratory: Clear to auscultation bilaterally. GI: Soft, nontender, non-distended  MS: No edema; No deformity. Neuro:  Nonfocal  Psych: Normal affect   Labs    Chemistry Recent Labs  Lab 02/27/18 1658  03/03/18 0502 03/04/18 0216 03/05/18 1332  NA 139   < > 135 135 133*  K 4.1   < > 5.3* 4.4 4.7  CL 101   < > 98 99 93*  CO2 24   < > 20* 22 24  GLUCOSE 134*   < > 84 121* 124*  BUN 31*   < > 95* 44* 63*  CREATININE 8.45*   < > 16.83* 11.06* 14.74*  CALCIUM 9.3   < > 8.7*  8.3* 7.8*  PROT 8.9*  --   --   --   --   ALBUMIN 3.5  --   --   --  2.7*  AST 28  --   --   --   --   ALT 42  --   --   --   --   ALKPHOS 141*  --   --   --   --   BILITOT 1.5*  --   --   --   --   GFRNONAA 6*   < > 3* 4* 3*  GFRAA 7*   < > 3* 5* 3*  ANIONGAP 14   < > 17* 14 16*   < > = values in this interval not displayed.     Hematology Recent Labs  Lab 03/03/18 1754 03/05/18 1332 03/06/18 0414  WBC 8.9 6.8 8.2  RBC 3.64* 3.41* 3.39*  HGB 10.7* 9.8* 10.1*  HCT 32.6* 30.8* 30.0*  MCV 89.6 90.3 88.5  MCH 29.4 28.7 29.8  MCHC 32.8 31.8 33.7  RDW 16.1* 16.0* 15.9*  PLT 199 192 160    Cardiac Enzymes Recent Labs  Lab 03/04/18 0923 03/04/18 1855 03/04/18 2305 03/05/18 0549  TROPONINI 4.69* 6.40* 5.84* 3.75*    Recent Labs  Lab 02/27/18 1702  TROPIPOC 0.09*     Radiology    No results found.  Cardiac Studies  2D Doppler  echocardiogram 02/28/2018: IMPRESSIONS   1. The left ventricle has moderate-severely reduced systolic function, with an ejection fraction of 30-35%. The cavity size was mild to moderately dilated. There is moderate concentric left ventricular hypertrophy. Left ventricular diastolic Doppler  parameters are consistent with impaired relaxation Left ventricular diffuse hypokinesis. 2. The right ventricle has normal systolic function. The cavity was normal. There is no increase in right ventricular wall thickness. 3. Left atrial size was moderately dilated. 4. The mitral valve is normal in structure. Moderate thickening of the mitral valve leaflet. Moderate calcification of the anterior and posterior mitral valve leaflets. There is moderate mitral annular calcification present. Mild mitral valve stenosis. 5. The tricuspid valve is normal in structure. 6. The aortic valve is tricuspid Moderate thickening of the aortic valve Moderate calcification of the aortic valve. Aortic valve regurgitation is moderate by color flow Doppler. mild stenosis of the aortic valve. 7. Right atrial pressure is estimated at 3 mmHg.  Patient Profile     Peter [name] is a 73 y.o. male with a hx of ESRD on HD, hypertension, CAD s/p DES to Lcx ('18), diabetes, systolic heart failure and anemia who is being seen today for the evaluation of chest pain, elevated troponin at the request of Dr. Erlinda Hong.  Assessment & Plan    1. Exertional angina with NSTEMI - Peak of troponin 6.4. Currently chest pain free. Plan for cath today despite stroke within past 7 days due to recurrent angina. Dr. Tamala Julian has discussed risk of hemorrhagic conversion of an ischemic stroke  with neurology, the internal medicine team, the patient, and his daughter who is power of attorney.  -Change nitro patch to imdur post cath.  2. ESRD on HD  3. HTN - minimally elevated despite multiple regimen. Likely permissive hypertension. Per primary team  4.  HLD - 02/28/2018: Cholesterol 106; HDL 36; LDL Cholesterol 48; Triglycerides 108; VLDL 22  - Continue statin   For questions or updates, please contact [facility] Please consult [email or web address] for contact info under        Signed, [employer]  Bhagat, PA  03/06/2018, 9:09 AM

## 2018-03-07 DIAGNOSIS — I25110 Atherosclerotic heart disease of native coronary artery with unstable angina pectoris: Secondary | ICD-10-CM

## 2018-03-07 DIAGNOSIS — I10 Essential (primary) hypertension: Secondary | ICD-10-CM

## 2018-03-07 DIAGNOSIS — I255 Ischemic cardiomyopathy: Secondary | ICD-10-CM

## 2018-03-07 LAB — CBC
HCT: 28.4 % — ABNORMAL LOW (ref 39.0–52.0)
HEMOGLOBIN: 9.2 g/dL — AB (ref 13.0–17.0)
MCH: 28.7 pg (ref 26.0–34.0)
MCHC: 32.4 g/dL (ref 30.0–36.0)
MCV: 88.5 fL (ref 80.0–100.0)
Platelets: 202 10*3/uL (ref 150–400)
RBC: 3.21 MIL/uL — ABNORMAL LOW (ref 4.22–5.81)
RDW: 15.5 % (ref 11.5–15.5)
WBC: 9.2 10*3/uL (ref 4.0–10.5)
nRBC: 0 % (ref 0.0–0.2)

## 2018-03-07 LAB — BASIC METABOLIC PANEL
ANION GAP: 12 (ref 5–15)
BUN: 45 mg/dL — ABNORMAL HIGH (ref 8–23)
CO2: 23 mmol/L (ref 22–32)
Calcium: 8 mg/dL — ABNORMAL LOW (ref 8.9–10.3)
Chloride: 96 mmol/L — ABNORMAL LOW (ref 98–111)
Creatinine, Ser: 11.01 mg/dL — ABNORMAL HIGH (ref 0.61–1.24)
GFR calc Af Amer: 5 mL/min — ABNORMAL LOW (ref >60)
GFR calc non Af Amer: 4 mL/min — ABNORMAL LOW (ref >60)
Glucose, Bld: 114 mg/dL — ABNORMAL HIGH (ref 70–99)
POTASSIUM: 4.8 mmol/L (ref 3.5–5.1)
Sodium: 131 mmol/L — ABNORMAL LOW (ref 135–145)

## 2018-03-07 LAB — GLUCOSE, CAPILLARY: Glucose-Capillary: 119 mg/dL — ABNORMAL HIGH (ref 70–99)

## 2018-03-07 MED ORDER — DARBEPOETIN ALFA 60 MCG/0.3ML IJ SOSY
60.0000 ug | PREFILLED_SYRINGE | INTRAMUSCULAR | Status: DC
  Administered 2018-03-08: 60 ug via INTRAVENOUS
  Filled 2018-03-07 (×2): qty 0.3

## 2018-03-07 MED ORDER — ISOSORBIDE MONONITRATE ER 60 MG PO TB24
60.0000 mg | ORAL_TABLET | Freq: Every day | ORAL | Status: DC
  Administered 2018-03-08 – 2018-03-29 (×21): 60 mg via ORAL
  Filled 2018-03-07 (×3): qty 1
  Filled 2018-03-07: qty 2
  Filled 2018-03-07 (×17): qty 1

## 2018-03-07 NOTE — Progress Notes (Addendum)
[id number] Observed pt walking with PT.  See their note for specifics. MI education completed with pt who voiced understanding. Stressed importance of plavix with stent. Reviewed NTG use, MI restrictions,and walking as tolerated as instructed by Rehab. Pt has ESRD and is on dialysis so will refer to dietary ed to dietitian at HD. Pt instructed to talk with dietitian re diet. Discussed CRP 2 for after he has completed Rehab services. Referred to [place] CRP 2. Assisted to bathroom and notified NT. Pt to use call light. Graylon Good RN BSN 03/07/2018 9:20 AM

## 2018-03-07 NOTE — Progress Notes (Signed)
Physical Therapy Treatment Patient Details Name: Peter [name] MRN: [id number] DOB: 1945/07/31 Today's Date: 03/07/2018    History of Present Illness Pt is a 73 y.o. male admitted 02/27/18 with L-sided weakness while at dialysis. MRI shows small volume diffusion abnormality involving parasagittal cerebral hemispheres, additional L frontal cortical involvement compatible with acute ischemia; remote bilat cerebellar infarcts. PMH includes HTN, ESRD on HD.    PT Comments    Patient seen for mobility progression s/p cardiac cath on 03/06/18. Patient motivated to participate. Patient requiring up to Mod A to power up from seated position at EOB with cueing for safety and sequencing. Patient ambulating in hallway with RW with min guard throughout from PT - cueing to increase BOS as well as for postural awareness. Patient maintaining HR 77-83 bpm and SpO2 on RA 100% with all mobility. PT to continue to follow.    Follow Up Recommendations  CIR;Supervision for mobility/OOB     Equipment Recommendations  None recommended by PT    Recommendations for Other Services       Precautions / Restrictions Precautions Precautions: Fall Precaution Comments: Inattention to left side (body and environment) Restrictions Weight Bearing Restrictions: No    Mobility  Bed Mobility Overal bed mobility: Needs Assistance Bed Mobility: Supine to Sit     Supine to sit: Supervision     General bed mobility comments: supervision for safety, increased time needed  Transfers Overall transfer level: Needs assistance Equipment used: Rolling walker (2 wheeled) Transfers: Sit to/from [facility] Sit to Stand: Min assist;Mod assist Stand pivot transfers: Min guard       General transfer comment: up to Mod A to power up from bedside; min guard for pivot transfer with cueing for safety  Ambulation/Gait Ambulation/Gait assistance: Min assist;Min guard Gait Distance (Feet): 100 Feet Assistive  device: Rolling walker (2 wheeled) Gait Pattern/deviations: Step-through pattern;Decreased stride length;Narrow base of support;Trunk flexed Gait velocity: Decreased   General Gait Details: slow pace of gait with patient stating gait is mentally fatigueing; cueing to increase BOS for increased stability; forward flexed posture.   Stairs             Wheelchair Mobility    Modified Rankin (Stroke Patients Only) Modified Rankin (Stroke Patients Only) Pre-Morbid Rankin Score: No symptoms Modified Rankin: Moderately severe disability     Balance Overall balance assessment: Needs assistance Sitting-balance support: No upper extremity supported;Feet supported Sitting balance-Leahy Scale: Good     Standing balance support: Bilateral upper extremity supported;During functional activity Standing balance-Leahy Scale: Poor Standing balance comment: Requires external support or UE support for standing balance.                             Cognition Arousal/Alertness: Awake/alert Behavior During Therapy: WFL for tasks assessed/performed Overall Cognitive Status: Within Functional Limits for tasks assessed                                        Exercises      General Comments General comments (skin integrity, edema, etc.): patient motivated to participate      Pertinent Vitals/Pain Pain Assessment: No/denies pain    Home Living                      Prior Function  PT Goals (current goals can now be found in the care plan section) Acute Rehab PT Goals Patient Stated Goal: Return to normal PT Goal Formulation: With patient Time For Goal Achievement: 03/14/18 Potential to Achieve Goals: Good Progress towards PT goals: Progressing toward goals    Frequency    Min 4X/week      PT Plan Current plan remains appropriate    Co-evaluation              AM-PAC PT "6 Clicks" Mobility   Outcome Measure  Help needed  turning from your back to your side while in a flat bed without using bedrails?: None Help needed moving from lying on your back to sitting on the side of a flat bed without using bedrails?: None Help needed moving to and from a bed to a chair (including a wheelchair)?: A Little Help needed standing up from a chair using your arms (e.g., wheelchair or bedside chair)?: A Lot Help needed to walk in hospital room?: A Little Help needed climbing 3-5 steps with a railing? : A Lot 6 Click Score: 18    End of Session Equipment Utilized During Treatment: Gait belt Activity Tolerance: Patient tolerated treatment well Patient left: in bed;with call bell/phone within reach Nurse Communication: Mobility status PT Visit Diagnosis: Other abnormalities of gait and mobility (R26.89);Muscle weakness (generalized) (M62.81)     Time: 1601-0932 PT Time Calculation (min) (ACUTE ONLY): 22 min  Charges:  $Gait Training: 8-22 mins                     Lanney Gins, PT, DPT Supplemental Physical Therapist 03/07/18 9:40 AM Pager: [phone] Office: [phone]

## 2018-03-07 NOTE — Progress Notes (Addendum)
Progress Note  Patient Name: Peter [name] Date of Encounter: 03/07/2018  Primary Cardiologist: Dr. Mathis Bud (see him in [facility])  Subjective   No chest pain overnight.  No shortness of breath at rest.  Eating breakfast.  Inpatient Medications    Scheduled Meds: . amLODipine  5 mg Oral QHS  . aspirin EC  81 mg Oral Daily  . atorvastatin  40 mg Oral q1800  . clopidogrel  75 mg Oral Daily  . darbepoetin (ARANESP) injection - DIALYSIS  40 mcg Intravenous Q Sat-HD  . famotidine  20 mg Oral QHS  . fluticasone  2 spray Each Nare BID  . losartan  100 mg Oral Daily  . metoprolol succinate  200 mg Oral Daily  . multivitamin  1 tablet Oral Daily  . nitroGLYCERIN  0.2 mg Transdermal Daily  . sodium chloride flush  3 mL Intravenous Q12H   Continuous Infusions: . sodium chloride     PRN Meds: sodium chloride, acetaminophen, nitroGLYCERIN, nitroGLYCERIN, polyvinyl alcohol, senna-docusate, sodium chloride flush   Vital Signs    Vitals:   03/06/18 2200 03/07/18 0423 03/07/18 0500 03/07/18 0712  BP:  (!) 172/71  (!) 152/71  Pulse:  71  70  Resp: 18 18  16   Temp:  98.5 F (36.9 C)  98.3 F (36.8 C)  TempSrc:  Oral  Oral  SpO2:  100%  98%  Weight:   70.1 kg   Height:        Intake/Output Summary (Last 24 hours) at 03/07/2018 0715 Last data filed at 03/06/2018 1900 Gross per 24 hour  Intake 480 ml  Output 1 ml  Net 479 ml   Last 3 Weights 03/07/2018 03/05/2018 03/05/2018  Weight (lbs) 154 lb 8.7 oz 152 lb 1.9 oz 153 lb  Weight (kg) 70.1 kg 69 kg 69.4 kg      Telemetry    SR - Personally Reviewed  ECG    SR with LVH - Personally Reviewed  Physical Exam   GEN: No acute distress.   Neck: No JVD Cardiac: RRR, no murmurs, rubs, or gallops.  Respiratory: Clear to auscultation bilaterally. GI: Soft, nontender, non-distended  MS: No edema; No deformity. Right groin site stable.  Neuro:  Nonfocal  Psych: Normal affect   Labs    Chemistry Recent Labs  Lab  03/04/18 0216 03/05/18 1332 03/07/18 0309  NA 135 133* 131*  K 4.4 4.7 4.8  CL 99 93* 96*  CO2 22 24 23   GLUCOSE 121* 124* 114*  BUN 44* 63* 45*  CREATININE 11.06* 14.74* 11.01*  CALCIUM 8.3* 7.8* 8.0*  ALBUMIN  --  2.7*  --   GFRNONAA 4* 3* 4*  GFRAA 5* 3* 5*  ANIONGAP 14 16* 12     Hematology Recent Labs  Lab 03/05/18 1332 03/06/18 0414 03/07/18 0309  WBC 6.8 8.2 9.2  RBC 3.41* 3.39* 3.21*  HGB 9.8* 10.1* 9.2*  HCT 30.8* 30.0* 28.4*  MCV 90.3 88.5 88.5  MCH 28.7 29.8 28.7  MCHC 31.8 33.7 32.4  RDW 16.0* 15.9* 15.5  PLT 192 160 202    Cardiac Enzymes Recent Labs  Lab 03/04/18 0923 03/04/18 1855 03/04/18 2305 03/05/18 0549  TROPONINI 4.69* 6.40* 5.84* 3.75*   No results for input(s): TROPIPOC in the last 168 hours.    Radiology    No results found.  Cardiac Studies   Cath: 03/06/2018   Prox LAD lesion is 85% stenosed.  Post intervention, there is a 0% residual stenosis.  A drug-eluting stent was successfully placed using a STENT RESOLUTE ONYX 3.5X26.  Ost Cx to Prox Cx lesion is 20% stenosed.  Ost 1st Mrg to 1st Mrg lesion is 90% stenosed.  Post intervention, there is a 20% residual stenosis.  Scoring balloon angioplasty was performed using a BALLOON WOLVERINE 2.50X10.  Prox RCA to Mid RCA lesion is 50% stenosed.  Mid RCA lesion is 70% stenosed.   1.  Significant three-vessel coronary artery disease with hazy 85% stenosis in the proximal LAD which appears to be the culprit for non-STEMI.  There is also significant in-stent restenosis into OM1 and borderline disease in the right coronary artery. 2.  Left ventricular angiography was not performed.  EF was moderately reduced by echo. 3.  Normal left ventricular end-diastolic pressure. 4.  Successful direct stenting of the proximal LAD with drug-eluting stent and Cutting Balloon angioplasty of ostial OM1.  Recommendations: Dual antiplatelet therapy with aspirin and clopidogrel for at least 1  year.  Aggressive treatment of risk factors.  I suspect the right coronary artery can be treated medically without the need for revascularization.  Echocardiogram 02/28/2018: 1. The left ventricle has moderate-severely reduced systolic function, with an ejection fraction of 30-35%. The cavity size was mild to moderately dilated. There is moderate concentric left ventricular hypertrophy. Left ventricular diastolic Doppler  parameters are consistent with impaired relaxation Left ventricular diffuse hypokinesis.  2. The right ventricle has normal systolic function. The cavity was normal. There is no increase in right ventricular wall thickness.  3. Left atrial size was moderately dilated.  4. The mitral valve is normal in structure. Moderate thickening of the mitral valve leaflet. Moderate calcification of the anterior and posterior mitral valve leaflets. There is moderate mitral annular calcification present. Mild mitral valve stenosis.  5. The tricuspid valve is normal in structure.  6. The aortic valve is tricuspid Moderate thickening of the aortic valve Moderate calcification of the aortic valve. Aortic valve regurgitation is moderate by color flow Doppler. mild stenosis of the aortic valve.  7. Right atrial pressure is estimated at 3 mmHg.  Patient Profile     73 y.o. male with a hx of ESRD on HD,hypertension,CADs/p DES to Lcx ('18),diabetes,systolic heart failureandanemiawho was seen for the evaluation of chest pain,elevated troponinat the request of Dr. Erlinda Hong.   Assessment & Plan    1. Exertional angina with NSTEMI: Peak of troponin 6.4. Underwent cardiac cath yesterday noted above with PCI/DESx1 to the LAD with scoring balloon to ISR in the ostium of OM1. Continue to treat RCA disease medically. Plan for DAPT with ASA/plavix for at least one year. No recurrent chest pain overnight. Work with cardiac rehab this morning.   2. ESRD on HD: planned for treatment today  3. HTN:  permissive hypertension per neurology.   4. HLD: Cholesterol 106; HDL 36; LDL Cholesterol 48; Triglycerides 108; VLDL 22  - Continue statin   For questions or updates, please contact [facility] Please consult [email or web address] for contact info under      Signed, Reino Bellis, NP  03/07/2018, 7:15 AM     Attending note:  Patient seen and examined.  I reviewed the chart and recent cardiology assessment and discussed the case with Ms. Mancel Bale NP.  Mr. Newby reports no chest pain this morning and appears comfortable eating breakfast.  He underwent cardiac catheterization yesterday as detailed above with placement of DES to the proximal LAD and also cutting balloon angioplasty of the ostial first obtuse marginal in the setting  of recent NSTEMI.  He is afebrile, heart rate in the 70s in sinus rhythm by telemetry which I personally reviewed.  Lungs exhibit decreased but clear breath sounds.  Cardiac exam reveals RRR with soft systolic murmur no gallop.  Catheterization arteriotomy site is stable.  Lab work shows potassium 4.8, BUN 45, creatinine 11.01, hemoglobin 9.2, platelets 202.  I personally reviewed his ECG which shows sinus rhythm with prolonged PR interval, LVH and repolarization abnormalities.  Neurology service signed off yesterday per chart.  [facility] will sign off.   Medication Recommendations: Continue current cardiac regimen including aspirin, Plavix, Lipitor, Norvasc, Cozaar, and Toprol-XL.  Change nitroglycerin patch to Imdur 60 mg daily for medical treatment of RCA disease. Other recommendations (labs, testing, etc): No further cardiac intervention planned at this time.  Can be considered for outpatient 30-day event recorder in light of recent embolic stroke.  This can be arranged through the patient's primary cardiology practice.  Whether or not he is a candidate for ICD placement in light of ischemic cardiomyopathy can also be discussed further as an outpatient (he was  seen in consultation here by Dr. Lovena Le). Follow up as an outpatient: Follow-up with primary cardiologist Dr. Beatrix Fetters within the next 2 weeks.  Satira Sark, M.D., F.A.C.C.

## 2018-03-07 NOTE — Progress Notes (Signed)
PROGRESS NOTE  Peter [name] JKK:[id number] DOB: 1945/03/26 DOA: 02/27/2018 PCP: Patient, No Pcp Per  HPI/Recap of past 24 hours:   S/p cardiac cath yesterday on 2/21 with PCI/DESx1 to the LAD with scoring balloon to ISR in the ostium of OM1. He is feeling better, denies pain, he appear much stronger and interactive  He is to have dialysis today, then back on regular MWF schedule Family at bedside   Assessment/Plan: Principal Problem:   Acute CVA (cerebrovascular accident) ([place]) Active Problems:   Elevated troponin   Elevated LFTs   HTN (hypertension)   ESRD on dialysis [facility])   CAD (coronary artery disease)   Diabetes ([place])   CHF (congestive heart failure) ([place])   Traumatic brain injury ([place])   Anemia   Right kidney mass   TIA (transient ischemic attack)   Chronic systolic CHF (congestive heart failure) ([employer])   NSTEMI (non-ST elevated myocardial infarction) ([place])   Carotid stenosis    Acute Stroke:  Multiple infarcts - embolic - unknown source. (presenting symptom) -patient presented with left sided weakness after dialysis, He did not receive IV t-PA due to mild deficits -angiogram cancelled due to acute acs -aspirin 325 mg daily prior to admission, then on aspirin 325 mg daily and plavix, then on heparin drip/asa 81/plavix, now s/p stent, off heparin drip -will discuss with interventional radiology Monday regarding reschedule cerebral angiogram  NSTEMI (2/18-2/19): -had chest pain midnight (2/18-2/19), troponin peaked at 6.4  - Cerebral angiogram cancelled. -s/p cardiac cath on 2/21 with PCI/DESx1 to the LAD with scoring balloon to ISR in the ostium of OM1. Continue to treat RCA disease medically. Plan for DAPT with ASA/plavix for at least one year   -cardiology recommend 30day event monitor and possible icd evaluation on outpatient basis through his primary cardiology [name]  Chronic systolic heart failure: ef 30-35% volume managed by dialysis  HTN:  On  toprol-xl, cozaar, imdur, norvasc, getting dialysis  ESRD on HD Anemia of chronic disease Dialysis per nephrology   Right renal mass. Incidental finding on ab [place] from 2/15 "Indeterminate small mass in the lower pole of the right kidney. This could reflect a complex cyst or solid lesion. Further evaluation recommended. Given the patient's renal insufficiency, noncontrast renal MRI is likely the best available imaging option."  Patient still make some urine, will try to obtain ua  Will need to follow up as outpatient  FTT;  He reports just recovering from a prolonged hospitalization last year, started driving two weeks ago CIR eval  Code Status: full  Family Communication: patient   Disposition Plan: CIR    Consultants:  cardiology  Neurology  nephrology  IR  CIR  Procedures:  Dialysis  Cardiac cath on 2/21 with PCI/DESx1 to the LAD with scoring balloon to ISR in the ostium of OM1.  Antibiotics:  none   Objective: BP (!) 176/57   Pulse 70   Temp 98.3 F (36.8 C) (Oral)   Resp 16   Ht 6\' 3"  (1.905 m)   Wt 70.1 kg   SpO2 98%   BMI 19.32 kg/m   Intake/Output Summary (Last 24 hours) at 03/07/2018 1221 Last data filed at 03/07/2018 0831 Gross per 24 hour  Intake 840 ml  Output 1 ml  Net 839 ml   Filed Weights   03/05/18 1315 03/05/18 1700 03/07/18 0500  Weight: 69.4 kg 69 kg 70.1 kg    Exam: Patient is examined daily including today on 03/07/2018, exams remain the same as  of yesterday except that has changed    General:  NAD, appear much stronger, pleasant and interactive  Cardiovascular: RRR  Respiratory: CTABL  Abdomen: Soft/ND/NT, positive BS  Musculoskeletal: No Edema  Neuro: alert, oriented   Data Reviewed: Basic Metabolic Panel: Recent Labs  Lab 03/02/18 0547 03/03/18 0502 03/04/18 0216 03/05/18 1332 03/07/18 0309  NA 136 135 135 133* 131*  K 5.2* 5.3* 4.4 4.7 4.8  CL 99 98 99 93* 96*  CO2 22 20* 22 24 23   GLUCOSE  89 84 121* 124* 114*  BUN 80* 95* 44* 63* 45*  CREATININE 14.97* 16.83* 11.06* 14.74* 11.01*  CALCIUM 8.3* 8.7* 8.3* 7.8* 8.0*  PHOS  --   --   --  5.5*  --    Liver Function Tests: Recent Labs  Lab 03/05/18 1332  ALBUMIN 2.7*   No results for input(s): LIPASE, AMYLASE in the last 168 hours. No results for input(s): AMMONIA in the last 168 hours. CBC: Recent Labs  Lab 03/02/18 0547 03/03/18 1754 03/05/18 1332 03/06/18 0414 03/07/18 0309  WBC 9.6 8.9 6.8 8.2 9.2  NEUTROABS 4.0  --   --   --   --   HGB 11.2* 10.7* 9.8* 10.1* 9.2*  HCT 34.7* 32.6* 30.8* 30.0* 28.4*  MCV 89.4 89.6 90.3 88.5 88.5  PLT 193 199 192 160 202   Cardiac Enzymes:   Recent Labs  Lab 03/04/18 0216 03/04/18 0923 03/04/18 1855 03/04/18 2305 03/05/18 0549  TROPONINI 0.44* 4.69* 6.40* 5.84* 3.75*   BNP (last 3 results) No results for input(s): BNP in the last 8760 hours.  ProBNP (last 3 results) No results for input(s): PROBNP in the last 8760 hours.  CBG: Recent Labs  Lab 03/06/18 1300 03/06/18 1319 03/06/18 1630 03/06/18 2116 03/07/18 0700  GLUCAP 66* 105* 108* 109* 119*    Recent Results (from the past 240 hour(s))  MRSA PCR Screening     Status: None   Collection Time: 03/05/18 10:44 PM  Result Value Ref Range Status   MRSA by PCR NEGATIVE NEGATIVE Final    Comment:        The GeneXpert MRSA Assay (FDA approved for NASAL specimens only), is one component of a comprehensive MRSA colonization surveillance program. It is not intended to diagnose MRSA infection nor to guide or monitor treatment for MRSA infections. Performed at [facility], [place] [address] [place], [place] [address]      Studies: No results found.  Scheduled Meds: . amLODipine  5 mg Oral QHS  . aspirin EC  81 mg Oral Daily  . atorvastatin  40 mg Oral q1800  . clopidogrel  75 mg Oral Daily  . [START ON 03/14/2018] darbepoetin (ARANESP) injection - DIALYSIS  60 mcg Intravenous Q Sat-HD  .  famotidine  20 mg Oral QHS  . fluticasone  2 spray Each Nare BID  . isosorbide mononitrate  60 mg Oral Daily  . losartan  100 mg Oral Daily  . metoprolol succinate  200 mg Oral Daily  . multivitamin  1 tablet Oral Daily  . sodium chloride flush  3 mL Intravenous Q12H    Continuous Infusions: . sodium chloride       Time spent: 80mins I have personally reviewed and interpreted on  03/07/2018 daily labs, tele strips, imagings as discussed above under date review session and assessment and plans.  I reviewed all nursing notes, pharmacy notes, consultant notes,  vitals, pertinent old records  I have discussed plan of  care as described above with RN , patient and family at bedside on 03/07/2018   Florencia Reasons MD, PhD  [facility] Pager [phone]. If 7PM-7AM, please contact night-coverage at [email or web address], password [facility] 03/07/2018, 12:21 PM  LOS: 7 days

## 2018-03-07 NOTE — Plan of Care (Signed)
  Problem: Activity: Goal: Ability to tolerate increased activity will improve Outcome: Progressing   

## 2018-03-07 NOTE — Progress Notes (Signed)
Admit: 02/27/2018 LOS: 7  12M ESRD, recent PD --> HD with acute CVAs  Subjective:  . Potentially to CIR . LHC with PCI to the proximal LAD; diffuse disease present . HD yesterday, 0.6 L ultrafiltration; no issues, IDH/ N/V     02/21 0701 - 02/22 0700 In: 480 [P.O.:480] Out: 1 [Stool:1]  Filed Weights   03/05/18 1315 03/05/18 1700 03/07/18 0500  Weight: 69.4 kg 69 kg 70.1 kg    Scheduled Meds: . amLODipine  5 mg Oral QHS  . aspirin EC  81 mg Oral Daily  . atorvastatin  40 mg Oral q1800  . clopidogrel  75 mg Oral Daily  . darbepoetin (ARANESP) injection - DIALYSIS  40 mcg Intravenous Q Sat-HD  . famotidine  20 mg Oral QHS  . fluticasone  2 spray Each Nare BID  . isosorbide mononitrate  60 mg Oral Daily  . losartan  100 mg Oral Daily  . metoprolol succinate  200 mg Oral Daily  . multivitamin  1 tablet Oral Daily  . sodium chloride flush  3 mL Intravenous Q12H   Continuous Infusions: . sodium chloride     PRN Meds:.sodium chloride, acetaminophen, nitroGLYCERIN, polyvinyl alcohol, senna-docusate, sodium chloride flush  Current Labs: reviewed    Physical Exam:  Blood pressure (!) 152/71, pulse 70, temperature 98.3 F (36.8 C), temperature source Oral, resp. rate 16, height '6\' 3"'  (1.905 m), weight 70.1 kg, SpO2 98 %. RRR nl s1s2 R IJ TDC C/D/I CTAB No sig LEE Weakness in LUE  Outpt HD [facility] HP: 149lb EDW, 2K, 2.5Ca, TDC, +IVB and cont hep with tx total about 4000 units. 3.5h. Qb 350  A 1. ESRD MWF on HD. Was on PD until admitted for PEA arrest [place] late 2019; [name] MD Neph follows 2. Acute CVA, L sided weakness; neuro following; ? To CIR.  Sig cerebral stenosis but not able to do angiogram 2/2 #3 3. NSTEMI 03/04/18: [place] 2/21 with PCI to LAD 4. Anemia, Hb stable, resume low dose ESA with HD 2/22 5. CKD BMD, use Tums 6. chrnoic systolc CHF 7. HTN, 5 meds as outpatient. Permissive HTN for now, largely controlled  P . HD off schedule for now, next today: Avoid IDH UF  no more than 0.51 L. Tight heparin. 2K, Qb 400, 4h . Back on MWF schedule next week . CIR eval . Aranesp 60 qwk  . Medication Issues; o Preferred narcotic agents for pain control are hydromorphone, fentanyl, and methadone. Morphine should not be used.  o Baclofen should be avoided o Avoid oral sodium phosphate and magnesium citrate based laxatives / bowel preps    [name] MD 03/07/2018, 10:52 AM  Recent Labs  Lab 03/04/18 0216 03/05/18 1332 03/07/18 0309  NA 135 133* 131*  K 4.4 4.7 4.8  CL 99 93* 96*  CO2 '22 24 23  ' GLUCOSE 121* 124* 114*  BUN 44* 63* 45*  CREATININE 11.06* 14.74* 11.01*  CALCIUM 8.3* 7.8* 8.0*  PHOS  --  5.5*  --    Recent Labs  Lab 03/02/18 0547  03/05/18 1332 03/06/18 0414 03/07/18 0309  WBC 9.6   < > 6.8 8.2 9.2  NEUTROABS 4.0  --   --   --   --   HGB 11.2*   < > 9.8* 10.1* 9.2*  HCT 34.7*   < > 30.8* 30.0* 28.4*  MCV 89.4   < > 90.3 88.5 88.5  PLT 193   < > 192 160 202   < > =  values in this interval not displayed.

## 2018-03-08 DIAGNOSIS — R627 Adult failure to thrive: Secondary | ICD-10-CM

## 2018-03-08 DIAGNOSIS — Z955 Presence of coronary angioplasty implant and graft: Secondary | ICD-10-CM

## 2018-03-08 LAB — RENAL FUNCTION PANEL
Albumin: 2.5 g/dL — ABNORMAL LOW (ref 3.5–5.0)
Anion gap: 14 (ref 5–15)
BUN: 54 mg/dL — ABNORMAL HIGH (ref 8–23)
CO2: 21 mmol/L — ABNORMAL LOW (ref 22–32)
Calcium: 8 mg/dL — ABNORMAL LOW (ref 8.9–10.3)
Chloride: 97 mmol/L — ABNORMAL LOW (ref 98–111)
Creatinine, Ser: 13.17 mg/dL — ABNORMAL HIGH (ref 0.61–1.24)
GFR, EST AFRICAN AMERICAN: 4 mL/min — AB (ref >60)
GFR, EST NON AFRICAN AMERICAN: 3 mL/min — AB (ref >60)
Glucose, Bld: 89 mg/dL (ref 70–99)
Phosphorus: 5 mg/dL — ABNORMAL HIGH (ref 2.5–4.6)
Potassium: 4.3 mmol/L (ref 3.5–5.1)
Sodium: 132 mmol/L — ABNORMAL LOW (ref 135–145)

## 2018-03-08 LAB — CBC
HCT: 24.7 % — ABNORMAL LOW (ref 39.0–52.0)
Hemoglobin: 7.9 g/dL — ABNORMAL LOW (ref 13.0–17.0)
MCH: 28.5 pg (ref 26.0–34.0)
MCHC: 32 g/dL (ref 30.0–36.0)
MCV: 89.2 fL (ref 80.0–100.0)
Platelets: 170 10*3/uL (ref 150–400)
RBC: 2.77 MIL/uL — ABNORMAL LOW (ref 4.22–5.81)
RDW: 15.8 % — ABNORMAL HIGH (ref 11.5–15.5)
WBC: 9.1 10*3/uL (ref 4.0–10.5)
nRBC: 0 % (ref 0.0–0.2)

## 2018-03-08 LAB — GLUCOSE, CAPILLARY: Glucose-Capillary: 66 mg/dL — ABNORMAL LOW (ref 70–99)

## 2018-03-08 MED ORDER — LORATADINE 10 MG PO TABS
10.0000 mg | ORAL_TABLET | ORAL | Status: DC
  Administered 2018-03-08 – 2018-03-28 (×11): 10 mg via ORAL
  Filled 2018-03-08 (×11): qty 1

## 2018-03-08 MED ORDER — HEPARIN SODIUM (PORCINE) 1000 UNIT/ML DIALYSIS
20.0000 [IU]/kg | INTRAMUSCULAR | Status: DC | PRN
  Administered 2018-03-08: 1400 [IU] via INTRAVENOUS_CENTRAL
  Filled 2018-03-08: qty 1.4

## 2018-03-08 MED ORDER — DARBEPOETIN ALFA 60 MCG/0.3ML IJ SOSY
PREFILLED_SYRINGE | INTRAMUSCULAR | Status: AC
  Administered 2018-03-08: 60 ug via INTRAVENOUS
  Filled 2018-03-08: qty 0.3

## 2018-03-08 MED ORDER — LORATADINE 10 MG PO TABS: 10.0000 mg | ORAL_TABLET | Freq: Every day | ORAL | Status: DC

## 2018-03-08 MED ORDER — HEPARIN SODIUM (PORCINE) 1000 UNIT/ML DIALYSIS
1000.0000 [IU] | INTRAMUSCULAR | Status: DC | PRN
  Administered 2018-03-08 – 2018-03-09 (×2): 1000 [IU] via INTRAVENOUS_CENTRAL
  Filled 2018-03-08 (×2): qty 1

## 2018-03-08 NOTE — Progress Notes (Addendum)
VO from DR. Schertz to change tx time to 3 hr HD tx initiated via HD cath w/o problem Bilat ports: pull/push/flush equally w/o problem VSS Will continue to monitor while on HD tx

## 2018-03-08 NOTE — Progress Notes (Signed)
Admit: 02/27/2018 LOS: 8  45M ESRD, recent PD --> HD with acute CVAs + NSTEMI s/p PCI  Subjective:  . HD 1L overnight, tol well . Hb down to 7.9  . Potentially to CIR  02/22 0701 - 02/23 0700 In: 1080 [P.O.:1080] Out: 1005   Filed Weights   03/07/18 0500 03/08/18 0239 03/08/18 0557  Weight: 70.1 kg 72.2 kg 71.2 kg    Scheduled Meds: . amLODipine  5 mg Oral QHS  . aspirin EC  81 mg Oral Daily  . atorvastatin  40 mg Oral q1800  . clopidogrel  75 mg Oral Daily  . darbepoetin (ARANESP) injection - DIALYSIS  60 mcg Intravenous Q Sat-HD  . famotidine  20 mg Oral QHS  . fluticasone  2 spray Each Nare BID  . isosorbide mononitrate  60 mg Oral Daily  . losartan  100 mg Oral Daily  . metoprolol succinate  200 mg Oral Daily  . multivitamin  1 tablet Oral Daily  . sodium chloride flush  3 mL Intravenous Q12H   Continuous Infusions: . sodium chloride     PRN Meds:.sodium chloride, acetaminophen, heparin, heparin, nitroGLYCERIN, polyvinyl alcohol, senna-docusate, sodium chloride flush  Current Labs: reviewed    Physical Exam:  Blood pressure (!) 160/59, pulse 82, temperature 98.6 F (37 C), temperature source Oral, resp. rate 17, height _0  (1.905 m), weight 71.2 kg, SpO2 100 %. RRR nl s1s2 R IJ TDC C/D/I CTAB No sig LEE Weakness in LUE  Outpt HD [facility] HP: 149lb EDW, 2K, 2.5Ca, TDC, +IVB and cont hep with tx total about 4000 units. 3.5h. Qb 350  A 1. ESRD MWF on HD. Was on PD until admitted for PEA arrest [place] late 2019; [name] MD Neph follows; remains interested in PD and circumstances regarding why stopped unclear; he would like 2nd opinoion as outpt once overall status improved, will f/u with me in the office to discuss 2. Acute CVA, L sided weakness; neuro following; ? To CIR.  Sig cerebral stenosis but not able to do angiogram 2/2 #3; ? If can do this upcoming week? 3. NSTEMI 03/04/18: [place] 2/21 with PCI to LAD 4. Anemia, Hb worsened, resume low dose ESA with HD 2/22; check  Fe stores 5. CKD BMD, using Tums; Ca ok 6. chrnoic systolc CHF 7. HTN, 5 meds as outpatient.  P . HD tomorrow back on MWF   Very sensitive to UF and has had IDH but is 7lb above EDW, try to push back towards EDW this week: tomorrow Tight heparin. 2K, Qb 400, 4h 1 to 1.5L UF . CIR eval . Aranesp 60 qwk, f/u Fe stores . Medication Issues; o Preferred narcotic agents for pain control are hydromorphone, fentanyl, and methadone. Morphine should not be used.  o Baclofen should be avoided o Avoid oral sodium phosphate and magnesium citrate based laxatives / bowel preps    [name] MD 03/08/2018, 9:47 AM  Recent Labs  Lab 03/05/18 1332 03/07/18 0309 03/08/18 0309  NA 133* 131* 132*  K 4.7 4.8 4.3  CL 93* 96* 97*  CO2 24 23 21*  GLUCOSE 124* 114* 89  BUN 63* 45* 54*  CREATININE 14.74* 11.01* 13.17*  CALCIUM 7.8* 8.0* 8.0*  PHOS 5.5*  --  5.0*   Recent Labs  Lab 03/02/18 0547  03/06/18 0414 03/07/18 0309 03/08/18 0308  WBC 9.6   < > 8.2 9.2 9.1  NEUTROABS 4.0  --   --   --   --  HGB 11.2*   < > 10.1* 9.2* 7.9*  HCT 34.7*   < > 30.0* 28.4* 24.7*  MCV 89.4   < > 88.5 88.5 89.2  PLT 193   < > 160 202 170   < > = values in this interval not displayed.

## 2018-03-08 NOTE — Clinical Social Work Note (Signed)
Clinical Social Work Assessment  Patient Details  Name: Peter [name] MRN: [id number] Date of Birth: 10/16/1945  Date of referral:  03/08/18               Reason for consult:  Facility Placement, Discharge Planning                Permission sought to share information with:  Facility Contact Representative, Family Supports Permission granted to share information::  Yes, Verbal Permission Granted  Name::     Malika Roman-Isler  Agency::     Relationship::  daughter  Contact Information:  [phone]  Housing/Transportation Living arrangements for the past 2 months:  Single Family Home Source of Information:  Patient Patient Interpreter Needed:  None Criminal Activity/Legal Involvement Pertinent to Current Situation/Hospitalization:  No - Comment as needed Significant Relationships:  Adult Children Lives with:  Self Do you feel safe going back to the place where you live?  Yes Need for family participation in patient care:  Yes (Comment)  Care giving concerns: Patient from home alone. PT recommending CIR. Patient's insurance denied for CIR on 03/03/18. Peer-to-peer review not completed, as patient was not medically ready. Plan for cerebral angiogram and then will work with PT again. Patient is on HD MWF.   Social Worker assessment / plan: CSW met with patient at bedside. Patient alert and oriented. CSW introduced self and role and discussed disposition planning.   Patient aware that his insurance initially denied for CIR. CSW noted possible plan to try insurance auth for CIR again (MD placed new consult for inpatient rehab). Explained to patient that if CIR will not consider patient, or if insurance denies again, he will have the option for SNF. Patient requested that CSW speak to his daughter.  Per MD, patient to have cerebral angiogram, hopefully Monday. Then will have PT re-evaluation. CSW will follow up with daughter pending continued workup. Will follow and support.  Employment  status:  Retired Insurance information:  Managed Medicare([employer]) PT Recommendations:  Inpatient Rehab Consult Information / Referral to community resources:  Skilled Nursing Facility  Patient/Family's Response to care: Patient appreciative of care.  Patient/Family's Understanding of and Emotional Response to Diagnosis, Current Treatment, and Prognosis: Patient with understanding of his condition.  Emotional Assessment Appearance:  Appears stated age Attitude/Demeanor/Rapport:  Engaged Affect (typically observed):  Calm, Appropriate, Flat Orientation:  Oriented to Self, Oriented to  Time, Oriented to Situation, Oriented to Place Alcohol / Substance use:  Not Applicable Psych involvement (Current and /or in the community):  No (Comment)  Discharge Needs  Concerns to be addressed:  Discharge Planning Concerns, Care Coordination Readmission within the last 30 days:  No Current discharge risk:  Physical Impairment, Lives alone Barriers to Discharge:  Continued Medical Work up, Insurance Authorization   Susan Porter, LCSW 03/08/2018, 1:48 PM  

## 2018-03-08 NOTE — Progress Notes (Signed)
HD tx completed @ [phone] w/ cath function issues throughout tx, had to end up reversing lines UF goal met Blood rinsed back VSS Report called to Ferdinand Lango, RN

## 2018-03-08 NOTE — Progress Notes (Signed)
PROGRESS NOTE  Peter [name] TML:[id number] DOB: 09-12-1945 DOA: 02/27/2018 PCP: Patient, No Pcp Per  HPI/Recap of past 24 hours:   He reports feeling weak today, he denies pain, no sob He reports being sore allover due to has to lay still during dialysis yesterday  Reports chronic nasal congestion, not relieved by flonase, denies headache   Assessment/Plan: Principal Problem:   Acute CVA (cerebrovascular accident) (Peter [name]) Active Problems:   Elevated troponin   Elevated LFTs   HTN (hypertension)   ESRD on dialysis Peter [name])   CAD (coronary artery disease)   Diabetes (Peter [name])   CHF (congestive heart failure) (Peter [name])   Traumatic brain injury (Peter [name])   Anemia   Right kidney mass   TIA (transient ischemic attack)   Chronic systolic CHF (congestive heart failure) ([facility])   NSTEMI (non-ST elevated myocardial infarction) (Peter [name])   Carotid stenosis    Acute Stroke:  Multiple infarcts - embolic - unknown source. (presenting symptom) -patient presented with left sided weakness after dialysis, He did not receive IV t-PA due to mild deficits --aspirin 325 mg daily prior to admission, then on aspirin 325 mg daily and plavix, then on heparin drip/asa 81/plavix, now s/p stent, off heparin drip -tele has been sinus rhythm since hospitalization -angiogram planned last week, cancelled due to acute acs -will discuss with interventional radiology Monday regarding reschedule cerebral angiogram, npo after midnight  NSTEMI (2/18-2/19): -had chest pain midnight (2/18-2/19), troponin peaked at 6.4  - Cerebral angiogram cancelled. -s/p cardiac cath on 2/21 with PCI/DESx1 to the LAD with scoring balloon to ISR in the ostium of OM1. Continue to treat RCA disease medically. Plan for DAPT with ASA/plavix for at least one year   -cardiology recommend 30day event monitor and possible icd evaluation on outpatient basis through his primary cardiology Dr Peter [name]  Chronic systolic heart failure: ef 30-35% volume managed  by dialysis  HTN:  On toprol-xl, cozaar, imdur, norvasc, getting dialysis  ESRD on HD MWF Anemia of chronic disease, hgb 7.9,  He is getting aranasp per nephrology. will get ua if he still make urine and FOBT Dialysis per nephrology   Right renal mass. Incidental finding on ab [place] from 2/15 "Indeterminate small mass in the lower pole of the right kidney. This could reflect a complex cyst or solid lesion. Further evaluation recommended. Given the patient's renal insufficiency, noncontrast renal MRI is likely the best available imaging option."  Not sure is he still make urine, will try to obtain ua  Will need to follow up as outpatient  Chronic nasal congestion:  Continue flonase, add on claritin   FTT;  He reports just recovering from a prolonged hospitalization last year, started driving two weeks ago CIR eval  Code Status: full  Family Communication: patient   Disposition Plan: CIR    Consultants:  cardiology  Neurology  nephrology  IR  CIR  Procedures:  Dialysis  Cardiac cath on 2/21 with PCI/DESx1 to the LAD with scoring balloon to ISR in the ostium of OM1.  Antibiotics:  none   Objective: BP (!) 160/59 (BP Location: Left Arm)   Pulse 82   Temp 98.6 F (37 C) (Oral)   Resp 17   Ht 6\' 3"  (1.905 m)   Wt 71.2 kg   SpO2 100%   BMI 19.62 kg/m   Intake/Output Summary (Last 24 hours) at 03/08/2018 0734 Last data filed at 03/08/2018 0550 Gross per 24 hour  Intake 1080 ml  Output 1005 ml  Net 75 ml  Filed Weights   03/07/18 0500 03/08/18 0239 03/08/18 0557  Weight: 70.1 kg 72.2 kg 71.2 kg    Exam: Patient is examined daily including today on 03/08/2018, exams remain the same as of yesterday except that has changed    General:  NAD, appear weak today  Cardiovascular: RRR  Respiratory: CTABL  Abdomen: Soft/ND/NT, positive BS  Musculoskeletal: No Edema  Neuro: alert, oriented   Data Reviewed: Basic Metabolic Panel: Recent Labs    Lab 03/03/18 0502 03/04/18 0216 03/05/18 1332 03/07/18 0309 03/08/18 0309  NA 135 135 133* 131* 132*  K 5.3* 4.4 4.7 4.8 4.3  CL 98 99 93* 96* 97*  CO2 20* 22 24 23  21*  GLUCOSE 84 121* 124* 114* 89  BUN 95* 44* 63* 45* 54*  CREATININE 16.83* 11.06* 14.74* 11.01* 13.17*  CALCIUM 8.7* 8.3* 7.8* 8.0* 8.0*  PHOS  --   --  5.5*  --  5.0*   Liver Function Tests: Recent Labs  Lab 03/05/18 1332 03/08/18 0309  ALBUMIN 2.7* 2.5*   No results for input(s): LIPASE, AMYLASE in the last 168 hours. No results for input(s): AMMONIA in the last 168 hours. CBC: Recent Labs  Lab 03/02/18 0547 03/03/18 1754 03/05/18 1332 03/06/18 0414 03/07/18 0309 03/08/18 0308  WBC 9.6 8.9 6.8 8.2 9.2 9.1  NEUTROABS 4.0  --   --   --   --   --   HGB 11.2* 10.7* 9.8* 10.1* 9.2* 7.9*  HCT 34.7* 32.6* 30.8* 30.0* 28.4* 24.7*  MCV 89.4 89.6 90.3 88.5 88.5 89.2  PLT 193 199 192 160 202 170   Cardiac Enzymes:   Recent Labs  Lab 03/04/18 0216 03/04/18 0923 03/04/18 1855 03/04/18 2305 03/05/18 0549  TROPONINI 0.44* 4.69* 6.40* 5.84* 3.75*   BNP (last 3 results) No results for input(s): BNP in the last 8760 hours.  ProBNP (last 3 results) No results for input(s): PROBNP in the last 8760 hours.  CBG: Recent Labs  Lab 03/06/18 1300 03/06/18 1319 03/06/18 1630 03/06/18 2116 03/07/18 0700  GLUCAP 66* 105* 108* 109* 119*    Recent Results (from the past 240 hour(s))  MRSA PCR Screening     Status: None   Collection Time: 03/05/18 10:44 PM  Result Value Ref Range Status   MRSA by PCR NEGATIVE NEGATIVE Final    Comment:        The GeneXpert MRSA Assay (FDA approved for NASAL specimens only), is one component of a comprehensive MRSA colonization surveillance program. It is not intended to diagnose MRSA infection nor to guide or monitor treatment for MRSA infections. Performed at [name], [place] [address], [place], [place] [address]      Studies: No results  found.  Scheduled Meds: . amLODipine  5 mg Oral QHS  . aspirin EC  81 mg Oral Daily  . atorvastatin  40 mg Oral q1800  . clopidogrel  75 mg Oral Daily  . darbepoetin (ARANESP) injection - DIALYSIS  60 mcg Intravenous Q Sat-HD  . famotidine  20 mg Oral QHS  . fluticasone  2 spray Each Nare BID  . isosorbide mononitrate  60 mg Oral Daily  . losartan  100 mg Oral Daily  . metoprolol succinate  200 mg Oral Daily  . multivitamin  1 tablet Oral Daily  . sodium chloride flush  3 mL Intravenous Q12H    Continuous Infusions: . sodium chloride       Time spent: 47mins I have personally reviewed and  interpreted on  03/08/2018 daily labs, tele strips, imagings as discussed above under date review session and assessment and plans.  I reviewed all nursing notes, pharmacy notes, consultant notes,  vitals, pertinent old records  I have discussed plan of care as described above with RN , patient  on 03/08/2018   Florencia Reasons MD, PhD  [facility] Pager [phone]. If 7PM-7AM, please contact night-coverage at [email or web address], password [facility] 03/08/2018, 7:34 AM  LOS: 8 days

## 2018-03-09 ENCOUNTER — Encounter (HOSPITAL_COMMUNITY)
Admission: EM | Disposition: A | Discharge status: Home Health | Payer: Self-pay | Source: Ambulatory Visit | Attending: Internal Medicine

## 2018-03-09 ENCOUNTER — Other Ambulatory Visit (HOSPITAL_COMMUNITY): Payer: Medicare HMO

## 2018-03-09 LAB — RENAL FUNCTION PANEL
Albumin: 2.7 g/dL — ABNORMAL LOW (ref 3.5–5.0)
Anion gap: 12 (ref 5–15)
BUN: 36 mg/dL — ABNORMAL HIGH (ref 8–23)
CO2: 24 mmol/L (ref 22–32)
CREATININE: 10.68 mg/dL — AB (ref 0.61–1.24)
Calcium: 8.4 mg/dL — ABNORMAL LOW (ref 8.9–10.3)
Chloride: 97 mmol/L — ABNORMAL LOW (ref 98–111)
GFR calc Af Amer: 5 mL/min — ABNORMAL LOW (ref >60)
GFR calc non Af Amer: 4 mL/min — ABNORMAL LOW (ref >60)
Glucose, Bld: 90 mg/dL (ref 70–99)
Phosphorus: 4.8 mg/dL — ABNORMAL HIGH (ref 2.5–4.6)
Potassium: 4.1 mmol/L (ref 3.5–5.1)
SODIUM: 133 mmol/L — AB (ref 135–145)

## 2018-03-09 LAB — IRON AND TIBC
Iron: 39 ug/dL — ABNORMAL LOW (ref 45–182)
SATURATION RATIOS: 22 % (ref 17.9–39.5)
TIBC: 181 ug/dL — ABNORMAL LOW (ref 250–450)
UIBC: 142 ug/dL

## 2018-03-09 LAB — CBC WITH DIFFERENTIAL/PLATELET
Abs Immature Granulocytes: 0.04 10*3/uL (ref 0.00–0.07)
Basophils Absolute: 0.1 10*3/uL (ref 0.0–0.1)
Basophils Relative: 1 %
Eosinophils Absolute: 1 10*3/uL — ABNORMAL HIGH (ref 0.0–0.5)
Eosinophils Relative: 12 %
HCT: 25.3 % — ABNORMAL LOW (ref 39.0–52.0)
Hemoglobin: 8.1 g/dL — ABNORMAL LOW (ref 13.0–17.0)
IMMATURE GRANULOCYTES: 1 %
Lymphocytes Relative: 32 %
Lymphs Abs: 2.8 10*3/uL (ref 0.7–4.0)
MCH: 28.4 pg (ref 26.0–34.0)
MCHC: 32 g/dL (ref 30.0–36.0)
MCV: 88.8 fL (ref 80.0–100.0)
Monocytes Absolute: 1.3 10*3/uL — ABNORMAL HIGH (ref 0.1–1.0)
Monocytes Relative: 15 %
NEUTROS ABS: 3.5 10*3/uL (ref 1.7–7.7)
Neutrophils Relative %: 39 %
Platelets: 204 10*3/uL (ref 150–400)
RBC: 2.85 MIL/uL — ABNORMAL LOW (ref 4.22–5.81)
RDW: 15.8 % — ABNORMAL HIGH (ref 11.5–15.5)
WBC: 8.7 10*3/uL (ref 4.0–10.5)
nRBC: 0 % (ref 0.0–0.2)

## 2018-03-09 LAB — PROTIME-INR
INR: 1.1
Prothrombin Time: 14.1 seconds (ref 11.4–15.2)

## 2018-03-09 LAB — TSH: TSH: 0.382 u[IU]/mL (ref 0.350–4.500)

## 2018-03-09 LAB — FOLATE: Folate: 35.2 ng/mL (ref >5.9)

## 2018-03-09 LAB — VITAMIN B12: Vitamin B-12: 666 pg/mL (ref 180–914)

## 2018-03-09 SURGERY — CANCELLED PROCEDURE

## 2018-03-09 MED ORDER — SODIUM CHLORIDE 0.9 % IV SOLN: 100.0000 mL | INTRAVENOUS | Status: DC | PRN

## 2018-03-09 MED ORDER — LIDOCAINE-PRILOCAINE 2.5-2.5 % EX CREA: 1.0000 "application " | TOPICAL_CREAM | CUTANEOUS | Status: DC | PRN

## 2018-03-09 MED ORDER — HEPARIN SODIUM (PORCINE) 1000 UNIT/ML IJ SOLN
INTRAMUSCULAR | Status: AC
  Administered 2018-03-09: 1000 [IU] via INTRAVENOUS_CENTRAL
  Filled 2018-03-09: qty 4

## 2018-03-09 MED ORDER — PENTAFLUOROPROP-TETRAFLUOROETH EX AERO: 1.0000 "application " | INHALATION_SPRAY | CUTANEOUS | Status: DC | PRN

## 2018-03-09 MED ORDER — ALTEPLASE 2 MG IJ SOLR: 2.0000 mg | Freq: Once | INTRAMUSCULAR | Status: DC | PRN

## 2018-03-09 MED ORDER — HEPARIN SODIUM (PORCINE) 1000 UNIT/ML DIALYSIS: 1000.0000 [IU] | INTRAMUSCULAR | Status: DC | PRN

## 2018-03-09 MED ORDER — LIDOCAINE HCL (PF) 1 % IJ SOLN: 5.0000 mL | INTRAMUSCULAR | Status: DC | PRN

## 2018-03-09 MED FILL — Heparin Sod (Porcine)-NaCl IV Soln 1000 Unit/500ML-0.9%: INTRAVENOUS | Qty: 500 | Status: AC

## 2018-03-09 MED FILL — Clopidogrel Bisulfate Tab 75 MG (Base Equiv): ORAL | Qty: 2 | Status: AC

## 2018-03-09 NOTE — Progress Notes (Signed)
PT Cancellation Note  Patient Details Name: Peter [name] MRN: [id number] DOB: 1945/11/22   Cancelled Treatment:    Reason Eval/Treat Not Completed: Other (comment)(Cardiac rehab had just walked pt.  will see pt tomorrow. )   [name] 03/09/2018, 2:23 PM [place] Pager:  [phone]  Office:  [phone]

## 2018-03-09 NOTE — Care Management Note (Addendum)
Case Management Note  Patient Details  Name: Peter [name] MRN: [id number] Date of Birth: 08/17/1945  Subjective/Objective:  Pt presented for stroke- Patient s/p PCI- Insurance declined CIR. CSW assisting patient for SNF transition of care needs.              Action/Plan: CM will continue to monitor for additional transition of care needs.   Expected Discharge Date:                  Expected Discharge Plan:  Skilled Nursing Facility  In-House Referral:  Clinical Social Work  Discharge planning Services  CM Consult  Post Acute Care Choice: Home Health Choice offered to:   Adult Children  DME Arranged: N/A   DME Agency:  N/A  HH Arranged: RN, Disease Management, Physical Therapy, Occupational Therapy   HH Agency:    [facility]   Status of Service:  Completed, signed off  If discussed at [place] of Stay Meetings, dates discussed:    Additional Comments: 03-20-18 1122 Jacqlyn Krauss, RN,BSN Case Manager [phone] Left ICA stent will be placed on 03/20/2018. CM will continue to monitor post procedure.   1129 03-17-18 Jacqlyn Krauss, RN,BSN [phone] Spoke with daughter and she chose [facility]. Referral Made to [facility] for RN, PT, Aide. Patient lives alone and has HD- MWF schedule. [name] to reach out to family- [place] to begin within 24-48 hours post transition home. CM will continue to monitor for additional transition of care needs.     1045 03-17-18 Jacqlyn Krauss, [place] [phone] CM did speak with patient regarding [facility]. Medicare[employer] list provided to patient. Patient asked CM to call his daughter and offer choice. CM did call daughter [name] to offer choice. Received voicemail- Awaiting call back.  [name], RN 03/09/2018, 3:07 PM

## 2018-03-09 NOTE — Plan of Care (Addendum)
Discussed with Dr. Florencia Reasons over the phone. Dr. Leonie Man saw pt and signed off on 03/06/18. He had b/l ACA infarcts and concerning for cardioembolic source. He subsequently developed NSTEMI and cardiology treated with cardiac stenting. He is on ASA and plavix for DAPT treatment. He also had left ICA segmental stenosis and plan for cerebral angio with potential carotid stenting with Dr. Estanislado Pandy. He also has anemia with Hb dropping after admission and may warrant GI work up. His EF 30-35%.  Pt stroke could be multifactorial given his carotid stenosis, intracranial stenosis, low EF and recent STEMI. He may also has occult AF which needs further investigation. However, at this time, he had cardiac stent and is undergoing carotid stenting, therefore he needs DAPT. At the meantime, he has dropping Hb and may need GI work up. At this time, he is not be a good anticoagulation candidate given above mentioned conditions. I would suggest to continue current plan of carotid stenting and anemia work up, and continue DAPT for cardiac and carotid stenting. Treat for cardiomyopathy with cardiology. He needs follow up with his primary cardiologist in 2 weeks as suggested by cardiology to consider 30 day cardiac event monitoring to rule out afib. Continue stroke risk factor modification, and PT/OT evaluation.   Pt will follow up with Dr. Leonie Man as outpt at [facility] in 4 weeks.   Rosalin Hawking, MD PhD Stroke Neurology 03/09/2018 8:30 AM

## 2018-03-09 NOTE — Procedures (Signed)
I was present at this dialysis session. I have reviewed the session itself and made appropriate changes.   Vital signs in last 24 hours:  Temp:  [97 F (36.1 C)-98.7 F (37.1 C)] (P) 97 F (36.1 C) (02/24 0715) Pulse Rate:  [69-73] (P) 70 (02/24 0730) Resp:  [16] (P) 16 (02/24 0715) BP: (153-163)/(56-65) (P) 164/68 (02/24 0730) SpO2:  [99 %-100 %] (P) 99 % (02/24 0715) Weight:  [70.4 kg-70.7 kg] (P) 70.7 kg (02/24 0715) Weight change: -1.802 kg Filed Weights   03/08/18 0557 03/09/18 0654 03/09/18 0715  Weight: 71.2 kg 70.4 kg (P) 70.7 kg    Recent Labs  Lab 03/08/18 0309  NA 132*  K 4.3  CL 97*  CO2 21*  GLUCOSE 89  BUN 54*  CREATININE 13.17*  CALCIUM 8.0*  PHOS 5.0*    Recent Labs  Lab 03/07/18 0309 03/08/18 0308 03/09/18 0438  WBC 9.2 9.1 8.7  NEUTROABS  --   --  3.5  HGB 9.2* 7.9* 8.1*  HCT 28.4* 24.7* 25.3*  MCV 88.5 89.2 88.8  PLT 202 170 204    Scheduled Meds: . amLODipine  5 mg Oral QHS  . aspirin EC  81 mg Oral Daily  . atorvastatin  40 mg Oral q1800  . clopidogrel  75 mg Oral Daily  . darbepoetin (ARANESP) injection - DIALYSIS  60 mcg Intravenous Q Sat-HD  . famotidine  20 mg Oral QHS  . fluticasone  2 spray Each Nare BID  . isosorbide mononitrate  60 mg Oral Daily  . loratadine  10 mg Oral Q48H  . losartan  100 mg Oral Daily  . metoprolol succinate  200 mg Oral Daily  . multivitamin  1 tablet Oral Daily  . sodium chloride flush  3 mL Intravenous Q12H   Continuous Infusions: . sodium chloride    . sodium chloride    . sodium chloride     PRN Meds:.sodium chloride, sodium chloride, sodium chloride, acetaminophen, alteplase, heparin, heparin, heparin, lidocaine (PF), lidocaine-prilocaine, nitroGLYCERIN, pentafluoroprop-tetrafluoroeth, polyvinyl alcohol, senna-docusate, sodium chloride flush    Outpt HD [facility] HP: 149lb EDW, 2K, 2.5Ca, TDC, +IVB and cont hep with tx total about 4000 units. 3.5h. Qb 350  Assessment/Plan 1. Acute CVA- carotid  stenosis and ICA stenosis for angio per Neuro IR 2. NSTEMI- s/p LHC and PCI to LAD on 03/06/18 on asa/plavix 3. ESRD- cont with HD qMWF.  Was on PD until he was admitted with PEA arrest.  Followed by Dr. Neta Ehlers.  Unclear plans regarding return to PD once stable.  4. Anemia of CKD and ABLA due to GIB- Hgb stable, transfuse if falls below 8. [name],  MD 03/09/2018, 8:34 AM

## 2018-03-09 NOTE — Progress Notes (Signed)
Saw patient on floor.  Plan for image-guided diagnostic cerebral arteriogram tentatively for tomorrow 03/10/2018 with Dr. Estanislado Pandy. Patient will be NPO at midnight.  Discussed procedure with patient, including risks, benefits, and indications. All questions answered and concerns addressed. Patient conveys understanding and agrees with plan.  Bea Graff Louk, PA-C 03/09/2018, 11:58 AM

## 2018-03-09 NOTE — Progress Notes (Signed)
PT Cancellation Note  Patient Details Name: Orvile Corona MRN: [id number] DOB: Apr 16, 1945   Cancelled Treatment:    Reason Eval/Treat Not Completed: Patient at procedure or test/unavailable(Pt in HD.  Will check back as able. )   [name] 03/09/2018, 10:40 AM  Korryn Pancoast,PT Acute Rehabilitation Services Pager:  [phone]  Office:  [phone]

## 2018-03-09 NOTE — Progress Notes (Signed)
CARDIAC REHAB PHASE I   PRE:  Rate/Rhythm: 76  BP:  Supine:   Sitting: 147/70  Standing:    SaO2: 93%RA  MODE:  Ambulation: 100 ft   POST:  Rate/Rhythm: 74  Per pulse ox  BP:  Supine:   Sitting: 155/61  Standing:    SaO2: 100%RA 1400-1430 Pt walked 100 ft on RA with rolling walker and asst x 1. To recliner after walk. Tolerated well. Call bell in reach.   Graylon Good, RN BSN  03/09/2018 2:26 PM

## 2018-03-09 NOTE — Progress Notes (Signed)
  Speech Language Pathology Treatment: Cognitive-Linquistic  Patient Details Name: Peter [name] MRN: [id number] DOB: 25-Dec-1945 Today's Date: 03/09/2018 Time: 3267-1245 SLP Time Calculation (min) (ACUTE ONLY): 17 min  Assessment / Plan / Recommendation Clinical Impression  Pt recalled previous speech therapy visits and treatment activities with Min cues. He continues to describe word-finding difficulties but did not recall strategies to use to facilitate this. He participated in divergent naming task, producing only 4 words in one minute, but generated more with Min cues for selective attention and word-finding strategies. Pt demonstrated anticipatory awareness by asking how he would implement strategies in conversation and participating in conversation about future planning. SLP will continue to follow.    HPI HPI: Pt is a 73 y.o. male admitted 02/27/18 with L-sided weakness while at dialysis. MRI shows small volume diffusion abnormality involving parasagittal cerebral hemispheres, additional L frontal cortical involvement compatible with acute ischemia; remote bilat cerebellar infarcts. PMH includes HTN, ESRD on HD.      SLP Plan  Continue with current plan of care       Recommendations                   Follow up Recommendations: Inpatient Rehab SLP Visit Diagnosis: Cognitive communication deficit (Y09.983) Plan: Continue with current plan of care       Peter [name] 03/09/2018, 1:26 PM  Peter [name], M.A. [place] Acute Environmental education officer [phone] Office [phone]

## 2018-03-09 NOTE — Progress Notes (Signed)
PROGRESS NOTE  Culley Hedeen HRC:[id number] DOB: [birth year] DOA: 02/27/2018 PCP: Patient, No Pcp Per  HPI/Recap of past 24 hours:   He is seen in dialysis unit, he denies pain, tolerating dialysis He is to have cerebral angiogram tomorrow He denies sob  Assessment/Plan: Principal Problem:   Acute CVA (cerebrovascular accident) ([place]) Active Problems:   Elevated troponin   Elevated LFTs   HTN (hypertension)   ESRD on dialysis ([place])   CAD (coronary artery disease)   Diabetes ([place])   CHF (congestive heart failure) ([place])   Traumatic brain injury ([place])   Anemia   Right kidney mass   TIA (transient ischemic attack)   Chronic systolic CHF (congestive heart failure) ([facility])   NSTEMI (non-ST elevated myocardial infarction) ([place])   Carotid stenosis   Status post coronary artery stent placement   FTT (failure to thrive) in adult    Acute Stroke:  Multiple infarcts - embolic - unknown source. (presenting symptom) -patient presented with left sided weakness after dialysis, He did not receive IV t-PA due to mild deficits --aspirin 325 mg daily prior to admission, then on aspirin 325 mg daily and plavix, then on heparin drip/asa 81/plavix, now s/p stent, off heparin drip -tele has been sinus rhythm since hospitalization -angiogram planned last week, cancelled due to acute acs, to have angiogram on 2/25 -case discussed with neurology, patient is advised to follow up with his cardiology to discuss TEe/ holter monitor/ loop recorder  NSTEMI (2/18-2/19): -had chest pain midnight (2/18-2/19), troponin peaked at 6.4  - Cerebral angiogram cancelled. -s/p cardiac cath on 2/21 with PCI/DESx1 to the LAD with scoring balloon to ISR in the ostium of OM1. Continue to treat RCA disease medically. Plan for DAPT with ASA/plavix for at least one year   -cardiology recommend 30day event monitor and possible icd evaluation on outpatient basis through his primary cardiology [name]  Chronic systolic  heart failure: ef 30-35% volume managed by dialysis  HTN:  On toprol-xl, cozaar, imdur, norvasc, getting dialysis  ESRD on HD MWF Anemia of chronic disease, hgb 7.9,  He is getting aranasp per nephrology. will get ua if he still make urine and FOBT Dialysis per nephrology   Right renal mass. Incidental finding on ab [place] from 2/15 "Indeterminate small mass in the lower pole of the right kidney. This could reflect a complex cyst or solid lesion. Further evaluation recommended. Given the patient's renal insufficiency, noncontrast renal MRI is likely the best available imaging option."  Not sure is he still make urine, will try to obtain ua  Will need to follow up as outpatient  Chronic nasal congestion:  Continue flonase, add on claritin   FTT;  He reports just recovering from a prolonged hospitalization last year, started driving two weeks ago CIR eval  Code Status: full  Family Communication: patient and daughter over the phone  Disposition Plan: to have cerebral angiogram tomorrow on 2/25, CIR    Consultants:  cardiology  Neurology  nephrology  IR  CIR  Procedures:  Dialysis  Cardiac cath on 2/21 with PCI/DESx1 to the LAD with scoring balloon to ISR in the ostium of OM1.  Antibiotics:  none   Objective: BP (!) 174/75   Pulse 70   Temp (!) 97 F (36.1 C) (Oral)   Resp 16   Ht 6\' 3"  (1.905 m)   Wt 70.7 kg   SpO2 99%   BMI 19.48 kg/m   Intake/Output Summary (Last 24 hours) at 03/09/2018 6803 Last data  filed at 03/08/2018 1451 Gross per 24 hour  Intake 582 ml  Output -  Net 582 ml   Filed Weights   03/08/18 0557 03/09/18 0654 03/09/18 0715  Weight: 71.2 kg 70.4 kg 70.7 kg    Exam: Patient is examined daily including today on 03/09/2018, exams remain the same as of yesterday except that has changed    General:  NAD, appear weak today  Cardiovascular: RRR  Respiratory: CTABL  Abdomen: Soft/ND/NT, positive BS  Musculoskeletal: No  Edema  Neuro: alert, oriented   Data Reviewed: Basic Metabolic Panel: Recent Labs  Lab 03/03/18 0502 03/04/18 0216 03/05/18 1332 03/07/18 0309 03/08/18 0309  NA 135 135 133* 131* 132*  K 5.3* 4.4 4.7 4.8 4.3  CL 98 99 93* 96* 97*  CO2 20* 22 24 23  21*  GLUCOSE 84 121* 124* 114* 89  BUN 95* 44* 63* 45* 54*  CREATININE 16.83* 11.06* 14.74* 11.01* 13.17*  CALCIUM 8.7* 8.3* 7.8* 8.0* 8.0*  PHOS  --   --  5.5*  --  5.0*   Liver Function Tests: Recent Labs  Lab 03/05/18 1332 03/08/18 0309  ALBUMIN 2.7* 2.5*   No results for input(s): LIPASE, AMYLASE in the last 168 hours. No results for input(s): AMMONIA in the last 168 hours. CBC: Recent Labs  Lab 03/05/18 1332 03/06/18 0414 03/07/18 0309 03/08/18 0308 03/09/18 0438  WBC 6.8 8.2 9.2 9.1 8.7  NEUTROABS  --   --   --   --  3.5  HGB 9.8* 10.1* 9.2* 7.9* 8.1*  HCT 30.8* 30.0* 28.4* 24.7* 25.3*  MCV 90.3 88.5 88.5 89.2 88.8  PLT 192 160 202 170 204   Cardiac Enzymes:   Recent Labs  Lab 03/04/18 0216 03/04/18 0923 03/04/18 1855 03/04/18 2305 03/05/18 0549  TROPONINI 0.44* 4.69* 6.40* 5.84* 3.75*   BNP (last 3 results) No results for input(s): BNP in the last 8760 hours.  ProBNP (last 3 results) No results for input(s): PROBNP in the last 8760 hours.  CBG: Recent Labs  Lab 03/06/18 1300 03/06/18 1319 03/06/18 1630 03/06/18 2116 03/07/18 0700  GLUCAP 66* 105* 108* 109* 119*    Recent Results (from the past 240 hour(s))  MRSA PCR Screening     Status: None   Collection Time: 03/05/18 10:44 PM  Result Value Ref Range Status   MRSA by PCR NEGATIVE NEGATIVE Final    Comment:        The GeneXpert MRSA Assay (FDA approved for NASAL specimens only), is one component of a comprehensive MRSA colonization surveillance program. It is not intended to diagnose MRSA infection nor to guide or monitor treatment for MRSA infections. Performed at [facility], [place] [address], [place], [place]  [address]      Studies: No results found.  Scheduled Meds: . amLODipine  5 mg Oral QHS  . aspirin EC  81 mg Oral Daily  . atorvastatin  40 mg Oral q1800  . clopidogrel  75 mg Oral Daily  . darbepoetin (ARANESP) injection - DIALYSIS  60 mcg Intravenous Q Sat-HD  . famotidine  20 mg Oral QHS  . fluticasone  2 spray Each Nare BID  . isosorbide mononitrate  60 mg Oral Daily  . loratadine  10 mg Oral Q48H  . losartan  100 mg Oral Daily  . metoprolol succinate  200 mg Oral Daily  . multivitamin  1 tablet Oral Daily  . sodium chloride flush  3 mL Intravenous Q12H    Continuous  Infusions: . sodium chloride    . sodium chloride    . sodium chloride       Time spent: 98mins, case discussed with neurology and interventional radiology I have personally reviewed and interpreted on  03/09/2018 daily labs, imagings as discussed above under date review session and assessment and plans.  I reviewed all nursing notes, pharmacy notes, consultant notes,  vitals, pertinent old records  I have discussed plan of care as described above with RN , patient  on 03/09/2018   Florencia Reasons MD, PhD  [facility] Pager [phone]. If 7PM-7AM, please contact night-coverage at [email or web address], password [facility] 03/09/2018, 8:38 AM  LOS: 9 days

## 2018-03-10 ENCOUNTER — Inpatient Hospital Stay (HOSPITAL_COMMUNITY): Payer: Medicare HMO

## 2018-03-10 DIAGNOSIS — D638 Anemia in other chronic diseases classified elsewhere: Secondary | ICD-10-CM

## 2018-03-10 DIAGNOSIS — I6522 Occlusion and stenosis of left carotid artery: Secondary | ICD-10-CM

## 2018-03-10 HISTORY — PX: IR ANGIO INTRA EXTRACRAN SEL COM CAROTID INNOMINATE BILAT MOD SED: IMG5360

## 2018-03-10 HISTORY — PX: IR ANGIO VERTEBRAL SEL VERTEBRAL BILAT MOD SED: IMG5369

## 2018-03-10 LAB — TYPE AND SCREEN
ABO/RH(D): A POS
Antibody Screen: NEGATIVE

## 2018-03-10 LAB — CBC WITH DIFFERENTIAL/PLATELET
Abs Immature Granulocytes: 0.03 10*3/uL (ref 0.00–0.07)
Basophils Absolute: 0.1 10*3/uL (ref 0.0–0.1)
Basophils Relative: 1 %
EOS ABS: 1.1 10*3/uL — AB (ref 0.0–0.5)
Eosinophils Relative: 14 %
HCT: 24.3 % — ABNORMAL LOW (ref 39.0–52.0)
Hemoglobin: 8 g/dL — ABNORMAL LOW (ref 13.0–17.0)
Immature Granulocytes: 0 %
Lymphocytes Relative: 33 %
Lymphs Abs: 2.7 10*3/uL (ref 0.7–4.0)
MCH: 29.4 pg (ref 26.0–34.0)
MCHC: 32.9 g/dL (ref 30.0–36.0)
MCV: 89.3 fL (ref 80.0–100.0)
MONO ABS: 1.2 10*3/uL — AB (ref 0.1–1.0)
MONOS PCT: 15 %
Neutro Abs: 3.1 10*3/uL (ref 1.7–7.7)
Neutrophils Relative %: 37 %
Platelets: 204 10*3/uL (ref 150–400)
RBC: 2.72 MIL/uL — ABNORMAL LOW (ref 4.22–5.81)
RDW: 15.9 % — ABNORMAL HIGH (ref 11.5–15.5)
WBC: 8.2 10*3/uL (ref 4.0–10.5)
nRBC: 0 % (ref 0.0–0.2)

## 2018-03-10 LAB — OCCULT BLOOD X 1 CARD TO LAB, STOOL: Fecal Occult Bld: POSITIVE — AB

## 2018-03-10 LAB — ABO/RH: ABO/RH(D): A POS

## 2018-03-10 MED ORDER — LIDOCAINE HCL 1 % IJ SOLN
INTRAMUSCULAR | Status: AC | PRN
  Administered 2018-03-10: 5 mL

## 2018-03-10 MED ORDER — MIDAZOLAM HCL 2 MG/2ML IJ SOLN
INTRAMUSCULAR | Status: AC
  Filled 2018-03-10: qty 2

## 2018-03-10 MED ORDER — IOHEXOL 300 MG/ML  SOLN
90.0000 mL | Freq: Once | INTRAMUSCULAR | Status: AC | PRN
  Administered 2018-03-10: 90 mL via INTRA_ARTERIAL

## 2018-03-10 MED ORDER — MIDAZOLAM HCL 2 MG/2ML IJ SOLN
INTRAMUSCULAR | Status: AC | PRN
  Administered 2018-03-10: 1 mg via INTRAVENOUS

## 2018-03-10 MED ORDER — LIDOCAINE HCL 1 % IJ SOLN
INTRAMUSCULAR | Status: AC
  Filled 2018-03-10: qty 20

## 2018-03-10 MED ORDER — HEPARIN SODIUM (PORCINE) 1000 UNIT/ML IJ SOLN
INTRAMUSCULAR | Status: AC
  Filled 2018-03-10: qty 1

## 2018-03-10 MED ORDER — SODIUM CHLORIDE 0.9 % IV SOLN
INTRAVENOUS | Status: AC
  Administered 2018-03-10: 13:00:00 via INTRAVENOUS

## 2018-03-10 MED ORDER — FENTANYL CITRATE (PF) 100 MCG/2ML IJ SOLN
INTRAMUSCULAR | Status: AC
  Filled 2018-03-10: qty 2

## 2018-03-10 MED ORDER — IOPAMIDOL (ISOVUE-300) INJECTION 61%
INTRAVENOUS | Status: AC
  Filled 2018-03-10: qty 50

## 2018-03-10 MED ORDER — SENNOSIDES-DOCUSATE SODIUM 8.6-50 MG PO TABS
1.0000 | ORAL_TABLET | Freq: Two times a day (BID) | ORAL | Status: DC
  Administered 2018-03-12 – 2018-03-27 (×17): 1 via ORAL
  Filled 2018-03-10 (×30): qty 1

## 2018-03-10 MED ORDER — POLYETHYLENE GLYCOL 3350 17 G PO PACK
17.0000 g | PACK | Freq: Every day | ORAL | Status: DC
  Administered 2018-03-21 – 2018-03-26 (×6): 17 g via ORAL
  Filled 2018-03-10 (×11): qty 1

## 2018-03-10 MED ORDER — HEPARIN SODIUM (PORCINE) 1000 UNIT/ML IJ SOLN
INTRAMUSCULAR | Status: AC | PRN
  Administered 2018-03-10: 1000 [IU] via INTRAVENOUS

## 2018-03-10 MED ORDER — FENTANYL CITRATE (PF) 100 MCG/2ML IJ SOLN
INTRAMUSCULAR | Status: AC | PRN
  Administered 2018-03-10: 25 ug via INTRAVENOUS

## 2018-03-10 NOTE — Progress Notes (Signed)
OT Cancellation Note  Patient Details Name: Peter [name] MRN: [id number] DOB: 1945/01/22   Cancelled Treatment:    Reason Eval/Treat Not Completed: Patient at procedure or test/ unavailable. Will check back at next appropriate/available time  Britt Bottom 03/10/2018, 10:44 AM

## 2018-03-10 NOTE — Progress Notes (Signed)
IP rehab admissioons - Noted patient to have a Left ICA stent on 03/17/18.  Once all tests and procedures are completed, we can determine if there is continued need for inpatient rehab admission.  We will not plan to seek approval or accept patient to CIR until after stent placement.  Call me for questions.  [phone]

## 2018-03-10 NOTE — Procedures (Signed)
S/P 4 vessel cerebral arteriogram RT CFA approach. Findings. 1.approx 90 % stenosis of LT ICA prox. 2.Approx 40 % stenosis RT ICA prox ,and 50 % in the distal cervical RT ICA. 3.Severe stenosis of RT VA at origin. 4.Approx 50 % stenosis of Lt ICA cavernous seg

## 2018-03-10 NOTE — Social Work (Signed)
CSW spoke to patient's daughter, [name], on the phone. Discussed SNF backup to CIR. Per CIR, they will try again for insurance authorization once updated PT notes are in.   CSW discussed options for SNF vs home health if denied for CIR. Daughter open to SNF referrals. She is hopeful for a SNF in [place]. Patient lives in [place] and daughter lives in [place]. She and her husband work and are not able to provide 24/7 supervision because they work during the day.  CSW sent out initial SNF referrals, will need to hard fax referrals to [facility].  Will provide bed offers when available. Will need [facility] for SNF prior to admitting to a facility.   Estanislado Emms, LCSW [phone]

## 2018-03-10 NOTE — Sedation Documentation (Signed)
Patient taken to [phone] by this nurse. Left groin checked with Janett Billow, RN. Left groin level 0. Dressing c/d/i.

## 2018-03-10 NOTE — Sedation Documentation (Signed)
IR tech continuing to hold pressure to left groin.

## 2018-03-10 NOTE — Progress Notes (Addendum)
PROGRESS NOTE  Randall Colden HCW:[id number] DOB: [birth year] DOA: 02/27/2018 PCP: Patient, No Pcp Per  Brief Summary:  H/o ESRD on HD, systolic chf, presents to the hospital with left sided weakness, found to have Acute stroke Later developed chest pain, found to have NSTEMI, s/p DES Left ICA stenosis, scheduled to have left ICA stent placement by [name] on 3/3   CIR vs snf placement after ICA stent Anemia, checking fobt Renal mass needs follow up  HPI/Recap of past 24 hours:  He returned from Cerebral angiogram today, he c/o back pain due to laying on the procedure table , feeling better now that his bed is elevated  Overall , he is improving, He denies sob, no chest pain, no confusion , left side weakness is improving Sister at bedside with permission   Assessment/Plan: Principal Problem:   Acute CVA (cerebrovascular accident) ([place]) Active Problems:   Elevated troponin   Elevated LFTs   HTN (hypertension)   ESRD on dialysis [facility])   CAD (coronary artery disease)   Diabetes ([place])   CHF (congestive heart failure) ([place])   Traumatic brain injury ([place])   Anemia   Right kidney mass   TIA (transient ischemic attack)   Chronic systolic CHF (congestive heart failure) ([facility])   NSTEMI (non-ST elevated myocardial infarction) ([place])   Carotid stenosis   Status post coronary artery stent placement   FTT (failure to thrive) in adult    Acute Stroke:  Multiple infarcts - embolic - unknown source. (presenting symptom) -patient presented with left sided weakness after dialysis, He did not receive IV t-PA due to mild deficits --aspirin 325 mg daily prior to admission, then on aspirin 325 mg daily and plavix, then on heparin drip/asa 81/plavix, now s/p cardiac stent, on asa 81mg  /plavix daily -tele has been sinus rhythm since hospitalization -angiogram today showed severe stenosis of L ICA, plan to proceed with stenting early next week (3/3) by [name], continue asa/plavix per Dr  Estanislado Pandy,  -keep permissive hypertension until ICA stenting -case discussed with neurology, patient is advised to follow up with his cardiology to discuss TEE/ holter monitor/ loop recorder (pease refer to neurology note by Dr Rosalin Hawking on 2/24) -PT eval, CIR vs SNF after left ICA stenting  NSTEMI (2/18-2/19): -had chest pain midnight (2/18-2/19), troponin peaked at 6.4  -Cerebral angiogram initially planned on 2/19 is cancelled due to NSTEMI -s/p cardiac cath on 2/21 with PCI/DESx1 to the LAD with scoring balloon to ISR in the ostium of OM1. Continue to treat RCA disease medically. Plan for DAPT with ASA/plavix for at least one year   -cardiology recommend 30day event monitor and TEE/possible ICD evaluation on outpatient basis through his primary cardiology [name] -Need to follow up with [name] in two week after discharge  Chronic systolic heart failure: ef 30-35% volume managed by dialysis F/u with primary cardiology   HTN:  On toprol-xl, cozaar, imdur, norvasc, getting dialysis  ESRD on HD MWF Anemia of chronic disease, hgb 7.9,  He is getting aranasp per nephrology. will get ua if he still make urine and FOBT pending collection, no overt bleeding prbc transfusion and Dialysis per nephrology   Right renal mass. Incidental finding on ab [place] from 2/15 "Indeterminate small mass in the lower pole of the right kidney. This could reflect a complex cyst or solid lesion. Further evaluation recommended. Given the patient's renal insufficiency, noncontrast renal MRI is likely the best available imaging option."  Not sure is he still  make urine, will try to obtain ua  Will need to follow up as outpatient, daughter is made aware to follow up on right renal mass.  Chronic nasal congestion:  Continue flonase, add on claritin   FTT;  He reports just recovering from a prolonged hospitalization last year, started driving two weeks ago prior to this hospitalization CIR eval  Code  Status: full  Family Communication: patient and daughter over the phone  Disposition Plan: to have left ICA placement early next week, then CIR  Vs SNF pending PT eval and insurance auth   Consultants:  Cardiology/EP [name]  Neurology  nephrology  IR [name]  CIR  Procedures:  Dialysis  Cardiac cath on 2/21 with PCI/DESx1 to the LAD with scoring balloon to ISR in the ostium of OM1.  Cerebral angiogram on 2/25  Antibiotics:  none   Objective: BP (!) 138/57   Pulse 67   Temp 99.2 F (37.3 C) (Oral)   Resp (!) 21   Ht 6\' 3"  (1.905 m)   Wt 69.5 kg   SpO2 99%   BMI 19.16 kg/m   Intake/Output Summary (Last 24 hours) at 03/10/2018 1632 Last data filed at 03/09/2018 2159 Gross per 24 hour  Intake 3 ml  Output -  Net 3 ml   Filed Weights   03/09/18 0715 03/09/18 1126 03/10/18 0503  Weight: 70.7 kg 68.7 kg 69.5 kg    Exam: Patient is examined daily including today on 03/10/2018, exams remain the same as of yesterday except that has changed    General:  NAD, appear stronger   Cardiovascular: RRR  Respiratory: CTABL  Abdomen: Soft/ND/NT, positive BS  Musculoskeletal: No Edema  Neuro: alert, oriented   Data Reviewed: Basic Metabolic Panel: Recent Labs  Lab 03/04/18 0216 03/05/18 1332 03/07/18 0309 03/08/18 0309 03/09/18 0740  NA 135 133* 131* 132* 133*  K 4.4 4.7 4.8 4.3 4.1  CL 99 93* 96* 97* 97*  CO2 22 24 23  21* 24  GLUCOSE 121* 124* 114* 89 90  BUN 44* 63* 45* 54* 36*  CREATININE 11.06* 14.74* 11.01* 13.17* 10.68*  CALCIUM 8.3* 7.8* 8.0* 8.0* 8.4*  PHOS  --  5.5*  --  5.0* 4.8*   Liver Function Tests: Recent Labs  Lab 03/05/18 1332 03/08/18 0309 03/09/18 0740  ALBUMIN 2.7* 2.5* 2.7*   No results for input(s): LIPASE, AMYLASE in the last 168 hours. No results for input(s): AMMONIA in the last 168 hours. CBC: Recent Labs  Lab 03/06/18 0414 03/07/18 0309 03/08/18 0308 03/09/18 0438 03/10/18 0321  WBC 8.2 9.2 9.1 8.7  8.2  NEUTROABS  --   --   --  3.5 3.1  HGB 10.1* 9.2* 7.9* 8.1* 8.0*  HCT 30.0* 28.4* 24.7* 25.3* 24.3*  MCV 88.5 88.5 89.2 88.8 89.3  PLT 160 202 170 204 204   Cardiac Enzymes:   Recent Labs  Lab 03/04/18 0216 03/04/18 0923 03/04/18 1855 03/04/18 2305 03/05/18 0549  TROPONINI 0.44* 4.69* 6.40* 5.84* 3.75*   BNP (last 3 results) No results for input(s): BNP in the last 8760 hours.  ProBNP (last 3 results) No results for input(s): PROBNP in the last 8760 hours.  CBG: Recent Labs  Lab 03/06/18 1300 03/06/18 1319 03/06/18 1630 03/06/18 2116 03/07/18 0700  GLUCAP 66* 105* 108* 109* 119*    Recent Results (from the past 240 hour(s))  MRSA PCR Screening     Status: None   Collection Time: 03/05/18 10:44 PM  Result Value Ref  Range Status   MRSA by PCR NEGATIVE NEGATIVE Final    Comment:        The GeneXpert MRSA Assay (FDA approved for NASAL specimens only), is one component of a comprehensive MRSA colonization surveillance program. It is not intended to diagnose MRSA infection nor to guide or monitor treatment for MRSA infections. Performed at [facility], [place] [address] [place], [place] [address]      Studies: No results found.  Scheduled Meds: . amLODipine  5 mg Oral QHS  . aspirin EC  81 mg Oral Daily  . atorvastatin  40 mg Oral q1800  . clopidogrel  75 mg Oral Daily  . darbepoetin (ARANESP) injection - DIALYSIS  60 mcg Intravenous Q Sat-HD  . famotidine  20 mg Oral QHS  . fentaNYL      . fluticasone  2 spray Each Nare BID  . heparin      . isosorbide mononitrate  60 mg Oral Daily  . lidocaine      . loratadine  10 mg Oral Q48H  . losartan  100 mg Oral Daily  . metoprolol succinate  200 mg Oral Daily  . midazolam      . multivitamin  1 tablet Oral Daily  . polyethylene glycol  17 g Oral Daily  . senna-docusate  1 tablet Oral BID  . sodium chloride flush  3 mL Intravenous Q12H    Continuous Infusions: . sodium chloride       Time  spent: 56mins, case discussed with  interventional radiology and CIR coordinator  I have personally reviewed and interpreted on  03/10/2018 daily labs, imagings as discussed above under date review session and assessment and plans.  I reviewed all nursing notes, pharmacy notes, consultant notes,  vitals, pertinent old records  I have discussed plan of care as described above with RN , patient and daughter over the phone  on 03/10/2018   Florencia Reasons MD, PhD  [facility] Pager [phone]. If 7PM-7AM, please contact night-coverage at [email or web address], password [facility] 03/10/2018, 4:32 PM  LOS: 10 days

## 2018-03-10 NOTE — Progress Notes (Signed)
PT Cancellation Note  Patient Details Name: Peter [name] MRN: [id number] DOB: Sep 12, 1945   Cancelled Treatment:    Reason Eval/Treat Not Completed: Patient at procedure or test/unavailable (to IR forcerebral arteriogram). Will follow-up for PT treatment as schedule permits.  Mabeline Caras, PT, DPT Acute Rehabilitation Services  Pager [phone] Office [place] 03/10/2018, 11:56 AM

## 2018-03-10 NOTE — Sedation Documentation (Signed)
Ir tech holding pressure to left groin.

## 2018-03-10 NOTE — Progress Notes (Signed)
Patient ID: Peter [name], male   DOB: 1945/07/03, 73 y.o.   MRN: [id number]  [facility] Progress Note    Subjective:   Seen after angiogram and feeling well   Objective:   BP (!) 158/52   Pulse 67   Temp 99.2 F (37.3 C) (Oral)   Resp (!) 21   Ht 6\' 3"  (1.905 m)   Wt 69.5 kg   SpO2 99%   BMI 19.16 kg/m   Intake/Output: I/O last 3 completed shifts: In: 3 [I.V.:3] Out: 1500 [Other:1500]   Intake/Output this shift:  No intake/output data recorded. Weight change: 0.302 kg  Physical Exam: Gen:NAD CVS: no rub Resp: cta Abd: +BS, soft, NT/ND Ext: no edema  Labs: BMET Recent Labs  Lab 03/04/18 0216 03/05/18 1332 03/07/18 0309 03/08/18 0309 03/09/18 0740  NA 135 133* 131* 132* 133*  K 4.4 4.7 4.8 4.3 4.1  CL 99 93* 96* 97* 97*  CO2 22 24 23  21* 24  GLUCOSE 121* 124* 114* 89 90  BUN 44* 63* 45* 54* 36*  CREATININE 11.06* 14.74* 11.01* 13.17* 10.68*  ALBUMIN  --  2.7*  --  2.5* 2.7*  CALCIUM 8.3* 7.8* 8.0* 8.0* 8.4*  PHOS  --  5.5*  --  5.0* 4.8*   CBC Recent Labs  Lab 03/07/18 0309 03/08/18 0308 03/09/18 0438 03/10/18 0321  WBC 9.2 9.1 8.7 8.2  NEUTROABS  --   --  3.5 3.1  HGB 9.2* 7.9* 8.1* 8.0*  HCT 28.4* 24.7* 25.3* 24.3*  MCV 88.5 89.2 88.8 89.3  PLT 202 170 204 204    @IMGRELPRIORS @ Medications:    . amLODipine  5 mg Oral QHS  . aspirin EC  81 mg Oral Daily  . atorvastatin  40 mg Oral q1800  . clopidogrel  75 mg Oral Daily  . darbepoetin (ARANESP) injection - DIALYSIS  60 mcg Intravenous Q Sat-HD  . famotidine  20 mg Oral QHS  . fentaNYL      . fluticasone  2 spray Each Nare BID  . heparin      . isosorbide mononitrate  60 mg Oral Daily  . lidocaine      . loratadine  10 mg Oral Q48H  . losartan  100 mg Oral Daily  . metoprolol succinate  200 mg Oral Daily  . midazolam      . multivitamin  1 tablet Oral Daily  . polyethylene glycol  17 g Oral Daily  . senna-docusate  1 tablet Oral BID  . sodium chloride flush  3 mL  Intravenous Q12H   Dialysis Orders:  [place] (will need to call in am for current orders) 3.5h  R IJ TDC Patient reported dry weight 155 lbs   Assessment/ Plan:   1. Acute CVA- s/p cerebral arteriogram with 90% stenosis of prox LICA, 71% prox RICA, and 50% in distal RICA, severe stenosis of RVA at origin and 24% stenosis of LICA cavernous segment per Dr. Estanislado Pandy.   2. NSTEMI- s/p PCI of LAD on 03/06/18 on asa/plavix 3. ESRD continue with HD qMWF 4. Anemia: on aranesp but also had ABLA due to GIB and transfuse prn 5. CKD-MBD:stable 6. Nutrition: renal diet 7. Hypertension: stable 8. Disposition- will need SNF  Peter Potts, MD [place] Pager [phone] 03/10/2018, 1:38 PM

## 2018-03-11 ENCOUNTER — Other Ambulatory Visit: Payer: Self-pay | Admitting: Student

## 2018-03-11 LAB — CBC
HCT: 25.3 % — ABNORMAL LOW (ref 39.0–52.0)
Hemoglobin: 8.1 g/dL — ABNORMAL LOW (ref 13.0–17.0)
MCH: 28.8 pg (ref 26.0–34.0)
MCHC: 32 g/dL (ref 30.0–36.0)
MCV: 90 fL (ref 80.0–100.0)
PLATELETS: 236 10*3/uL (ref 150–400)
RBC: 2.81 MIL/uL — ABNORMAL LOW (ref 4.22–5.81)
RDW: 15.7 % — ABNORMAL HIGH (ref 11.5–15.5)
WBC: 9.7 10*3/uL (ref 4.0–10.5)
nRBC: 0 % (ref 0.0–0.2)

## 2018-03-11 LAB — RENAL FUNCTION PANEL
Albumin: 2.7 g/dL — ABNORMAL LOW (ref 3.5–5.0)
Anion gap: 13 (ref 5–15)
BUN: 40 mg/dL — ABNORMAL HIGH (ref 8–23)
CO2: 23 mmol/L (ref 22–32)
Calcium: 8 mg/dL — ABNORMAL LOW (ref 8.9–10.3)
Chloride: 97 mmol/L — ABNORMAL LOW (ref 98–111)
Creatinine, Ser: 10.24 mg/dL — ABNORMAL HIGH (ref 0.61–1.24)
GFR calc Af Amer: 5 mL/min — ABNORMAL LOW (ref >60)
GFR calc non Af Amer: 5 mL/min — ABNORMAL LOW (ref >60)
Glucose, Bld: 95 mg/dL (ref 70–99)
Phosphorus: 4.9 mg/dL — ABNORMAL HIGH (ref 2.5–4.6)
Potassium: 4 mmol/L (ref 3.5–5.1)
Sodium: 133 mmol/L — ABNORMAL LOW (ref 135–145)

## 2018-03-11 LAB — GLUCOSE, CAPILLARY: Glucose-Capillary: 73 mg/dL (ref 70–99)

## 2018-03-11 LAB — PLATELET INHIBITION P2Y12: Platelet Function  P2Y12: 337 [PRU] — ABNORMAL HIGH (ref 182–335)

## 2018-03-11 MED ORDER — HEPARIN SODIUM (PORCINE) 1000 UNIT/ML IJ SOLN
INTRAMUSCULAR | Status: AC
  Administered 2018-03-11: 3800 [IU] via INTRAVENOUS_CENTRAL
  Filled 2018-03-11: qty 4

## 2018-03-11 MED ORDER — HEPARIN SODIUM (PORCINE) 1000 UNIT/ML DIALYSIS: 20.0000 [IU]/kg | INTRAMUSCULAR | Status: DC | PRN

## 2018-03-11 MED ORDER — TICAGRELOR 90 MG PO TABS
90.0000 mg | ORAL_TABLET | Freq: Two times a day (BID) | ORAL | Status: DC
  Administered 2018-03-12 – 2018-03-19 (×15): 90 mg via ORAL
  Filled 2018-03-11 (×16): qty 1

## 2018-03-11 MED ORDER — HEPARIN SODIUM (PORCINE) 1000 UNIT/ML DIALYSIS
20.0000 [IU]/kg | INTRAMUSCULAR | Status: DC | PRN
  Administered 2018-03-11: 3800 [IU] via INTRAVENOUS_CENTRAL
  Filled 2018-03-11: qty 1.4

## 2018-03-11 NOTE — Progress Notes (Signed)
PROGRESS NOTE  Peter [name] WLN:[id number] DOB: 1945/02/25 DOA: 02/27/2018 PCP: Patient, No Pcp Per  Brief Summary:  2 AAM  H/o ESRD on HD x 3 years-prior on PD for 3 years, systolic chf ef 74-08% 2/2 CAD ans Stent x1  02/2016, htn hld Presented to the hospital 02/27/2018 with left sided weakness, found to have Acute stroke Later developed chest pain, found to have NSTEMI, s/p DES Left ICA stenosis, scheduled to have left ICA stent placement by [name] on 3/3   CIR vs snf placement after ICA stent Anemia, checking fobt Renal mass needs follow up  HPI/Recap of past 24 hours:  Overall fair Siting up in bed no distress No fever no chills Eating some [place] no CP  Assessment/Plan: Principal Problem:   Cerebrovascular accident (CVA) ([place]) Active Problems:   Elevated troponin   Elevated LFTs   HTN (hypertension)   ESRD on dialysis ([place])   CAD (coronary artery disease)   Diabetes ([facility])   CHF (congestive heart failure) ([place])   Traumatic brain injury ([place])   Anemia of chronic disease   Right kidney mass   TIA (transient ischemic attack)   Chronic systolic CHF (congestive heart failure) ([facility])   NSTEMI (non-ST elevated myocardial infarction) ([place])   Carotid stenosis   Status post coronary artery stent placement   FTT (failure to thrive) in adult    Acute Stroke:  Multiple infarcts - embolic - unknown source. (presenting symptom) -patient presented with left sided weakness after dialysis, He did not receive IV t-PA due to mild deficits --aspirin 325 mg daily prior to admission, then on aspirin 325 mg daily and plavix, then on heparin drip/asa 81/plavix, now s/p cardiac stent, on asa 81mg  /plavix daily --changed to [place] per IR-Neuro for anticpated--- cerebral stenting early next week (3/3) by [name],  -tele has been sinus rhythm since hospitalization -keep permissive hypertension until ICA stenting -needs per neuro TEE/ holter monitor/ loop recorder (pease  refer to neurology note by Dr Rosalin Hawking on 2/24) -PT eval, CIR vs SNF after left ICA stenting  NSTEMI (2/18-2/19): -had chest pain midnight (2/18-2/19), troponin peaked at 6.4  -s/p cardiac cath on 2/21 with PCI/DESx1 to the LAD with scoring balloon to ISR in the ostium of OM1. Continue to treat RCA disease medically. Plan for DAPT with ASA/plavix for at least one year   -cardiology recommend 30 day event monitor and TEE/possible ICD evaluation on outpatient basis through his primary cardiology [name] -Need to follow up with [name] in two week after discharge  Chronic systolic heart failure: ef 30-35% volume managed by dialysis F/u with primary cardiology   HTN:  On toprol-xl, cozaar, imdur, norvasc, getting dialysis  ESRD on HD MWF Anemia of chronic disease, hgb 7.9,  He is getting aranasp per nephrology.   no overt bleeding prbc transfusion and Dialysis per nephrology  Right renal mass. Incidental finding on ab [place] from 2/15 "Indeterminate small mass in the lower pole of the right kidney. This could reflect a complex cyst or solid lesion. Further evaluation recommended. Given the patient's renal insufficiency, noncontrast renal MRI is likely the best available imaging option." Not sure is he still make urine, will try to obtain ua Will need to follow up as outpatient, daughter is made aware to follow up on right renal mass.  Chronic nasal congestion:  Continue flonase, add on claritin   FTT;  He reports just recovering from a prolonged hospitalization last year, started driving two  weeks ago prior to this hospitalization CIR eval  Code Status: full Family Communication: patient only today Disposition Plan: to have left ICA placement early next week, then CIR  Vs SNF pending PT eval and insurance auth   Consultants:  Cardiology/EP [name]  Neurology  nephrology  IR [name]  CIR  Procedures:  Dialysis  Cardiac cath on 2/21 with PCI/DESx1 to  the LAD with scoring balloon to ISR in the ostium of OM1.  Cerebral angiogram on 2/25  Antibiotics:  none   Objective: BP (!) 141/57 (BP Location: Right Arm)   Pulse 75   Temp 98.6 F (37 C) (Oral)   Resp 18   Ht 6\' 3"  (1.905 m)   Wt 68 kg   SpO2 96%   BMI 18.74 kg/m   Intake/Output Summary (Last 24 hours) at 03/11/2018 1441 Last data filed at 03/11/2018 1045 Gross per 24 hour  Intake 603 ml  Output 1553 ml  Net -950 ml   Filed Weights   03/11/18 0627 03/11/18 0700 03/11/18 1000  Weight: 70.2 kg 69.9 kg 68 kg    Exam:  Awake coherent in nad  eomi ncat looks about stated age  cta b no added sound  abd soft nt nd no rebound no guard  No LE edema  Neuro intact no focal neuro deficit--able to raise both hands above head  Data Reviewed: Basic Metabolic Panel: Recent Labs  Lab 03/05/18 1332 03/07/18 0309 03/08/18 0309 03/09/18 0740 03/11/18 0723  NA 133* 131* 132* 133* 133*  K 4.7 4.8 4.3 4.1 4.0  CL 93* 96* 97* 97* 97*  CO2 24 23 21* 24 23  GLUCOSE 124* 114* 89 90 95  BUN 63* 45* 54* 36* 40*  CREATININE 14.74* 11.01* 13.17* 10.68* 10.24*  CALCIUM 7.8* 8.0* 8.0* 8.4* 8.0*  PHOS 5.5*  --  5.0* 4.8* 4.9*   Liver Function Tests: Recent Labs  Lab 03/05/18 1332 03/08/18 0309 03/09/18 0740 03/11/18 0723  ALBUMIN 2.7* 2.5* 2.7* 2.7*   No results for input(s): LIPASE, AMYLASE in the last 168 hours. No results for input(s): AMMONIA in the last 168 hours. CBC: Recent Labs  Lab 03/07/18 0309 03/08/18 0308 03/09/18 0438 03/10/18 0321 03/11/18 0723  WBC 9.2 9.1 8.7 8.2 9.7  NEUTROABS  --   --  3.5 3.1  --   HGB 9.2* 7.9* 8.1* 8.0* 8.1*  HCT 28.4* 24.7* 25.3* 24.3* 25.3*  MCV 88.5 89.2 88.8 89.3 90.0  PLT 202 170 204 204 236   Cardiac Enzymes:   Recent Labs  Lab 03/04/18 1855 03/04/18 2305 03/05/18 0549  TROPONINI 6.40* 5.84* 3.75*   BNP (last 3 results) No results for input(s): BNP in the last 8760 hours.  ProBNP (last 3 results) No  results for input(s): PROBNP in the last 8760 hours.  CBG: Recent Labs  Lab 03/06/18 1319 03/06/18 1630 03/06/18 2116 03/07/18 0700 03/11/18 1040  GLUCAP 105* 108* 109* 119* 73    Recent Results (from the past 240 hour(s))  MRSA PCR Screening     Status: None   Collection Time: 03/05/18 10:44 PM  Result Value Ref Range Status   MRSA by PCR NEGATIVE NEGATIVE Final    Comment:        The GeneXpert MRSA Assay (FDA approved for NASAL specimens only), is one component of a comprehensive MRSA colonization surveillance program. It is not intended to diagnose MRSA infection nor to guide or monitor treatment for MRSA infections. Performed at [facility], [address], [place], [place] [address]      Studies: No results found.  Scheduled Meds: . amLODipine  5 mg Oral QHS  . aspirin EC  81 mg Oral Daily  . atorvastatin  40 mg Oral q1800  . darbepoetin (ARANESP) injection - DIALYSIS  60 mcg Intravenous Q Sat-HD  . famotidine  20 mg Oral QHS  . fluticasone  2 spray Each Nare BID  . isosorbide mononitrate  60 mg Oral Daily  . loratadine  10 mg Oral Q48H  . losartan  100 mg Oral Daily  . metoprolol succinate  200 mg Oral Daily  . multivitamin  1 tablet Oral Daily  . polyethylene glycol  17 g Oral Daily  . senna-docusate  1 tablet Oral BID  . sodium chloride flush  3 mL Intravenous Q12H  . [START ON 03/12/2018] ticagrelor  90 mg Oral BID    Continuous Infusions: . sodium chloride       Time spent: 39mins, case discussed with  interventional radiology and CIR coordinator  I have personally reviewed and interpreted on  03/11/2018 daily labs, imagings as discussed above under date review session and assessment and plans.  I reviewed all nursing notes, pharmacy notes, consultant notes,  vitals, pertinent old records  I have discussed plan of care as described above with RN , patient and daughter over the phone  on 03/11/2018   Verneita Griffes, MD [facility] 2:41 PM

## 2018-03-11 NOTE — Progress Notes (Signed)
  Speech Language Pathology Treatment: Cognitive-Linquistic  Patient Details Name: Peter [name] MRN: [id number] DOB: 1945-04-22 Today's Date: 03/11/2018 Time: 2956-2130 SLP Time Calculation (min) (ACUTE ONLY): 17 min  Assessment / Plan / Recommendation Clinical Impression  Pt participated in medication management task with Min cues for anticipatory awareness, verbal problem solving, and recall of information. He also discussed the book he was reading and verbalized effective functional problem solving by chunking the book to facilitate his attention and therefore his recall. Pt continues to share that his primary concern is his "word seeking" abilities. Will f/u to address higher level cognitive-linguistic skills.   HPI HPI: Pt is a 73 y.o. male admitted 02/27/18 with L-sided weakness while at dialysis. MRI shows small volume diffusion abnormality involving parasagittal cerebral hemispheres, additional L frontal cortical involvement compatible with acute ischemia; remote bilat cerebellar infarcts. PMH includes HTN, ESRD on HD.      SLP Plan  Continue with current plan of care       Recommendations                   Follow up Recommendations: Inpatient Rehab SLP Visit Diagnosis: Cognitive communication deficit (Q65.784) Plan: Continue with current plan of care       [employer] Nicholson 03/11/2018, 2:41 PM  Pollyann Glen, M.A. [place] Acute Environmental education officer [phone] Office [phone]

## 2018-03-11 NOTE — Evaluation (Signed)
Occupational Therapy Evaluation Patient Details Name: Peter [name] MRN: [id number] DOB: January 07, 1946 Today's Date: 03/11/2018    History of Present Illness Pt is a 73 y.o. male admitted 02/27/18 with L-sided weakness while at dialysis. MRI shows small volume diffusion abnormality involving parasagittal cerebral hemispheres, additional L frontal cortical involvement compatible with acute ischemia; remote bilat cerebellar infarcts. Pt with 2x NSTEMIs since admission. S/p PCI of LAD on 2/21. 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 Tentative plan for L ICA stent on 03/17/18. PMH includes HTN, ESRD on HD.   Clinical Impression   Pt increasing independence with ADL and mobility. Pt steady with fair balance with RW in hallway able to avoid obstacles and negative for left neglect or inattention. Pt reading from menu in small, medium and large print. Pt navigating through hallway and able to problem solve and find room. Pt with higher level balance deficits and requires continued OT skilled services to continue to increase independence in order to return home alone. Pt requires intensive therapy at this time in CIR, but may progress to [facility] in the next sessions. OT to follow acutely.    Follow Up Recommendations  CIR;Home health OT;Supervision - Intermittent    Equipment Recommendations  None recommended by OT    Recommendations for Other Services Rehab consult     Precautions / Restrictions Precautions Precautions: Fall Restrictions Weight Bearing Restrictions: No      Mobility Bed Mobility Overal bed mobility: Needs Assistance Bed Mobility: Supine to Sit     Supine to sit: Modified independent (Device/Increase time)     General bed mobility comments: in recliner upon arrival  Transfers Overall transfer level: Needs assistance Equipment used: Rolling walker (2 wheeled) Transfers: Sit  to/from Stand Sit to Stand: Min guard Stand pivot transfers: Min guard       General transfer comment: Able to stand from edge of bed and lower chair height without arm rests; heavy reliance on BUE support to push into standing. Min guard with transition from [place] from sitting surface to RW    Balance Overall balance assessment: Needs assistance Sitting-balance support: No upper extremity supported;Feet supported Sitting balance-Leahy Scale: Good     Standing balance support: Bilateral upper extremity supported;During functional activity Standing balance-Leahy Scale: Fair Standing balance comment: Reliant on UE support                 Standardized Balance Assessment Standardized Balance Assessment : Dynamic Gait Index   Dynamic Gait Index Level Surface: Mild Impairment Change in Gait Speed: Moderate Impairment Gait with Horizontal Head Turns: Moderate Impairment Gait with Vertical Head Turns: Moderate Impairment Gait and Pivot Turn: Mild Impairment Step Over Obstacle: Severe Impairment Step Around Obstacles: Severe Impairment Steps: Moderate Impairment Total Score: 8     ADL either performed or assessed with clinical judgement   ADL Overall ADL's : Needs assistance/impaired Eating/Feeding: Supervision/ safety;Sitting Eating/Feeding Details (indicate cue type and reason): Min A for bilateral FM tasks Grooming: Supervision/safety;Standing   Upper Body Bathing: Supervision/ safety;Standing   Lower Body Bathing: Min guard;Sit to/from stand   Upper Body Dressing : Min guard;Standing   Lower Body Dressing: Min guard;Sit to/from stand   Toilet Transfer: Min guard;RW   Toileting- Water quality scientist and Hygiene: Minimal assistance;Sitting/lateral lean;Sit to/from stand       Functional mobility during ADLs: Min guard;Rolling walker General ADL Comments: Balance impaired, but pt with insight into deficits at this time. pt  requires increased time for mobility and  increased effort to be able to perform ADLs.     Vision Baseline Vision/History: Wears glasses Wears Glasses: At all times Patient Visual Report: No change from baseline Vision Assessment?: Yes Eye Alignment: Within Functional Limits Ocular Range of Motion: Within Functional Limits Tracking/Visual Pursuits: Able to track stimulus in all quads without difficulty     Perception     Praxis      Pertinent Vitals/Pain Pain Assessment: No/denies pain Faces Pain Scale: Hurts a little bit Pain Location: L knee Pain Descriptors / Indicators: Radiating Pain Intervention(s): Monitored during session     Hand Dominance Right   Extremity/Trunk Assessment Upper Extremity Assessment Upper Extremity Assessment: Generalized weakness LUE Deficits / Details: poor to fair coordination with LUE mostly from previous CVA;   Lower Extremity Assessment Lower Extremity Assessment: Generalized weakness;LLE deficits/detail;Defer to PT evaluation LLE Deficits / Details: weakness noted   Cervical / Trunk Assessment Cervical / Trunk Assessment: Other exceptions Cervical / Trunk Exceptions: Forward flexion of shoulders and neck   Communication Communication Communication: No difficulties   Cognition Arousal/Alertness: Awake/alert Behavior During Therapy: Flat affect Overall Cognitive Status: Impaired/Different from baseline Area of Impairment: Safety/judgement;Problem solving                   Current Attention Level: Selective Memory: Decreased short-term memory Following Commands: Follows multi-step commands with increased time Safety/Judgement: Decreased awareness of safety   Problem Solving: Slow processing;Requires verbal cues General Comments: Visual scanning, WFLs. pt able to avoid obstacles in hallway and avoid bumpinginto things on either side    General Comments  No visual field seen when reading small letters or big letters from unfamiliar material    Exercises      Shoulder Instructions      Home Living Family/patient expects to be discharged to:: Private residence Living Arrangements: Alone Available Help at Discharge: Family Type of Home: House Home Access: Stairs to enter Technical brewer of Steps: 3 Entrance Stairs-Rails: Right;Left Home Layout: One level     Bathroom Shower/Tub: Teacher, early years/pre: Handicapped height     Home Equipment: Shower seat;Grab bars - toilet;Walker - 2 wheels;Cane - single point          Prior Functioning/Environment Level of Independence: Independent with assistive device(s)        Comments: SPC for household ambulation (holds cane in L hand). RW community ambulation. Drives. Indep with ADLs; assist for some household tasks (i.e. cooking). Indep manage meds        OT Problem List: Decreased strength;Decreased activity tolerance;Impaired balance (sitting and/or standing);Decreased coordination;Decreased safety awareness;Impaired UE functional use      OT Treatment/Interventions: Self-care/ADL training;Therapeutic exercise;Energy conservation;DME and/or AE instruction;Therapeutic activities;Patient/family education    OT Goals(Current goals can be found in the care plan section) Acute Rehab OT Goals Patient Stated Goal: Return to normal OT Goal Formulation: With patient/family Time For Goal Achievement: 03/14/18 Potential to Achieve Goals: Good  OT Frequency: Min 3X/week   Barriers to D/C: Decreased caregiver support          Co-evaluation              AM-PAC OT "6 Clicks" Daily Activity     Outcome Measure Help from another person eating meals?: A Little Help from another person taking care of personal grooming?: A Little Help from another person toileting, which includes using toliet, bedpan, or urinal?: A Little Help from another person bathing (including washing, rinsing, drying)?:  A Little Help from another person to put on and taking off regular upper body  clothing?: A Little Help from another person to put on and taking off regular lower body clothing?: A Little 6 Click Score: 18   End of Session Equipment Utilized During Treatment: Rolling walker Nurse Communication: Mobility status  Activity Tolerance: Patient tolerated treatment well Patient left: in chair;with call bell/phone within reach  OT Visit Diagnosis: Unsteadiness on feet (R26.81);Other abnormalities of gait and mobility (R26.89);Muscle weakness (generalized) (M62.81);Other symptoms and signs involving cognitive function;Hemiplegia and hemiparesis;Low vision, both eyes (H54.2) Hemiplegia - Right/Left: Left Hemiplegia - dominant/non-dominant: Non-Dominant Hemiplegia - caused by: Cerebral infarction                Time: 1470-9295 OT Time Calculation (min): 24 min Charges:  OT General Charges $OT Visit: 1 Visit OT Evaluation $OT Re-eval: 1 Re-eval OT Treatments $Neuromuscular Re-education: 8-22 mins  Ebony Hail Harold Hedge) Marsa Aris OTR/L Acute Rehabilitation Services Pager: [phone] Office: [phone]    Fredda Hammed 03/11/2018, 5:46 PM

## 2018-03-11 NOTE — Procedures (Signed)
I was present at this dialysis session. I have reviewed the session itself and made appropriate changes.   Vital signs in last 24 hours:  Temp:  [97.2 F (36.2 C)-99 F (37.2 C)] 98.4 F (36.9 C) (02/26 0700) Pulse Rate:  [67-73] 73 (02/26 0830) Resp:  [7-21] 18 (02/26 0700) BP: (138-201)/(52-78) 201/69 (02/26 0830) SpO2:  [97 %-100 %] 97 % (02/26 0700) Weight:  [69.9 kg-70.2 kg] 69.9 kg (02/26 0700) Weight change: -0.483 kg Filed Weights   03/10/18 0503 03/11/18 0627 03/11/18 0700  Weight: 69.5 kg 70.2 kg 69.9 kg    Recent Labs  Lab 03/11/18 0723  NA 133*  K 4.0  CL 97*  CO2 23  GLUCOSE 95  BUN 40*  CREATININE 10.24*  CALCIUM 8.0*  PHOS 4.9*    Recent Labs  Lab 03/09/18 0438 03/10/18 0321 03/11/18 0723  WBC 8.7 8.2 9.7  NEUTROABS 3.5 3.1  --   HGB 8.1* 8.0* 8.1*  HCT 25.3* 24.3* 25.3*  MCV 88.8 89.3 90.0  PLT 204 204 236    Scheduled Meds: . amLODipine  5 mg Oral QHS  . aspirin EC  81 mg Oral Daily  . atorvastatin  40 mg Oral q1800  . clopidogrel  75 mg Oral Daily  . darbepoetin (ARANESP) injection - DIALYSIS  60 mcg Intravenous Q Sat-HD  . famotidine  20 mg Oral QHS  . fluticasone  2 spray Each Nare BID  . isosorbide mononitrate  60 mg Oral Daily  . loratadine  10 mg Oral Q48H  . losartan  100 mg Oral Daily  . metoprolol succinate  200 mg Oral Daily  . multivitamin  1 tablet Oral Daily  . polyethylene glycol  17 g Oral Daily  . senna-docusate  1 tablet Oral BID  . sodium chloride flush  3 mL Intravenous Q12H   Continuous Infusions: . sodium chloride     PRN Meds:.sodium chloride, acetaminophen, heparin, heparin, nitroGLYCERIN, polyvinyl alcohol, sodium chloride flush    Dialysis Orders: [place] (will need to call in am for current orders) 3.5h R IJ TDC Patient reported dry weight 155 lbs   Assessment/ Plan:   1. Acute CVA- s/p cerebral arteriogram with 90% stenosis of prox LICA, 07% prox RICA, and 50% in distal RICA,  severe stenosis of RVA at origin and 86% stenosis of LICA cavernous segment per Dr. Estanislado Pandy.   1. For LICA stent on 07/19/42 by Dr. Estanislado Pandy 2. NSTEMI- s/p PCI of LAD on 03/06/18 on asa/plavix 3. ESRD continue with HD qMWF.  He is actually below edw.  Will need new edw at time of discharge.  4. Anemia: on aranesp but also had ABLA due to GIB and transfuse prn 5. CKD-MBD:stable 6. Nutrition: renal diet 7. Hypertension: stable 8. Disposition- will need SNF   [name],  MD 03/11/2018, 8:47 AM

## 2018-03-11 NOTE — Progress Notes (Signed)
Physical Therapy Treatment Patient Details Name: Gladys Gutman MRN: [id number] DOB: Jun 04, 1945 Today's Date: 03/11/2018    History of Present Illness Pt is a 73 y.o. male admitted 02/27/18 with L-sided weakness while at dialysis. MRI shows small volume diffusion abnormality involving parasagittal cerebral hemispheres, additional L frontal cortical involvement compatible with acute ischemia; remote bilat cerebellar infarcts. Pt with 2x NSTEMIs since admission. S/p PCI of LAD on 2/21. 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 Tentative plan for L ICA stent on 03/17/18. PMH includes HTN, ESRD on HD.   PT Comments    Pt progressing well with mobility despite fatigue post-HD today. Today's session focused on gait training with SPC; pt reports using SPC for household ambulation and some community ambulation. Pt ambulatory using SPC and min guard without challenge; pt requires min-modA to correct multiple bouts of LOB with various challenges to balance. Dynamic Gait Index score of 9/24 indicates significant increased risk for falls with higher level balance activities.   Noted pt plans to remain admitted at least until cardiac procedure tentatively planned 03/17/18; CIR to reassess need for admission after this. Will continue to follow acutely to assist with best discharge plan.    Follow Up Recommendations  CIR;Supervision for mobility/OOB(pending pt progression post-procedure)     Equipment Recommendations  None recommended by PT    Recommendations for Other Services       Precautions / Restrictions Precautions Precautions: Fall Restrictions Weight Bearing Restrictions: No    Mobility  Bed Mobility Overal bed mobility: Needs Assistance Bed Mobility: Supine to Sit     Supine to sit: Modified independent (Device/Increase time)        Transfers Overall transfer level: Needs  assistance Equipment used: Rolling walker (2 wheeled) Transfers: Sit to/from Stand Sit to Stand: Min guard         General transfer comment: Able to stand from edge of bed and lower chair height without arm rests; heavy reliance on BUE support to push into standing. Min guard with transition from [place] from sitting surface to RW  Ambulation/Gait Ambulation/Gait assistance: Min assist;Min guard Gait Distance (Feet): 200 Feet Assistive device: Rolling walker (2 wheeled);Straight cane Gait Pattern/deviations: Step-through pattern;Decreased stride length;Narrow base of support;Trunk flexed Gait velocity: Decreased Gait velocity interpretation: <1.31 ft/sec, indicative of household ambulator General Gait Details: Slow, steady gait with RW in room, min guard for safety. Hallway ambulation with SPC, intermittent min-modA to prevent LOB with higher level balance tasks; able to slightly increase gait speed and step length with cues, but quickly returning to short, slow steps   Stairs             Wheelchair Mobility    Modified Rankin (Stroke Patients Only) Modified Rankin (Stroke Patients Only) Pre-Morbid Rankin Score: No symptoms Modified Rankin: Moderately severe disability     Balance Overall balance assessment: Needs assistance Sitting-balance support: No upper extremity supported;Feet supported Sitting balance-Leahy Scale: Good     Standing balance support: Bilateral upper extremity supported;During functional activity Standing balance-Leahy Scale: Poor Standing balance comment: Reliant on UE support                 Standardized Balance Assessment Standardized Balance Assessment : Dynamic Gait Index   Dynamic Gait Index Level Surface: Mild Impairment Change in Gait Speed: Moderate Impairment Gait with Horizontal Head Turns: Moderate Impairment Gait with Vertical Head Turns: Moderate Impairment Gait and Pivot Turn: Mild Impairment Step Over Obstacle:  Severe  Impairment Step Around Obstacles: Severe Impairment Steps: Moderate Impairment Total Score: 8      Cognition Arousal/Alertness: Awake/alert Behavior During Therapy: Flat affect Overall Cognitive Status: Impaired/Different from baseline Area of Impairment: Problem solving                             Problem Solving: Slow processing;Requires verbal cues General Comments: Reports only visual deficits he notices are that he is relying on glasses more; noted pt scanning consistently to L-side without cues. Sometimes slow to respond or follow a direction, sometimes needing clarification for simple instructions, unsure if related to cognition or fatigue       Exercises      General Comments        Pertinent Vitals/Pain Pain Assessment: Faces Faces Pain Scale: Hurts a little bit Pain Location: L knee Pain Descriptors / Indicators: Radiating Pain Intervention(s): Monitored during session    Home Living                      Prior Function            PT Goals (current goals can now be found in the care plan section) Acute Rehab PT Goals Patient Stated Goal: Return to normal PT Goal Formulation: With patient Time For Goal Achievement: 03/20/18 Potential to Achieve Goals: Good Progress towards PT goals: Progressing toward goals    Frequency    Min 4X/week      PT Plan Current plan remains appropriate    Co-evaluation              AM-PAC PT "6 Clicks" Mobility   Outcome Measure  Help needed turning from your back to your side while in a flat bed without using bedrails?: None Help needed moving from lying on your back to sitting on the side of a flat bed without using bedrails?: None Help needed moving to and from a bed to a chair (including a wheelchair)?: A Little Help needed standing up from a chair using your arms (e.g., wheelchair or bedside chair)?: A Little Help needed to walk in hospital room?: A Little Help needed climbing 3-5  steps with a railing? : A Lot 6 Click Score: 19    End of Session Equipment Utilized During Treatment: Gait belt Activity Tolerance: Patient tolerated treatment well Patient left: in chair;with call bell/phone within reach Nurse Communication: Mobility status PT Visit Diagnosis: Other abnormalities of gait and mobility (R26.89);Muscle weakness (generalized) (M62.81)     Time: 4196-2229 PT Time Calculation (min) (ACUTE ONLY): 25 min  Charges:  $Gait Training: 23-37 mins                    Mabeline Caras, PT, DPT Acute Rehabilitation Services  Pager [phone] Office [place] 03/11/2018, 3:32 PM

## 2018-03-11 NOTE — H&P (Signed)
Chief Complaint: Patient was seen in consultation today for proximal left ICA stenosis.  Referring Physician(s): Garvin Fila  Supervising Physician: Luanne Bras  Patient Status: [facility] - In-pt  History of Present Illness: Peter [name] is a 73 y.o. 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 He presented to [facility] ED 02/27/2018 with complaint of LUE weakness. He was found to have an acute CVA and was admitted for further management. He underwent an image-guided diagnostic cerebral arteriogram 03/10/2018 by Dr. Estanislado Pandy which revealed multiple areas of extracranial and intracranial stenosis, including severe stenosis of proximal left ICA.  Diagnostic cerebral arteriogram 03/10/2018: 1. Approximately 90% stenosis of left ICA prox. 2. Approximately 40% stenosis right ICA prox, and 50% in the distal cervical right ICA. 3. Severe stenosis of right VA at origin. 4. Approximately 50% stenosis of left ICA cavernous segment.  Patient presents for possible image-guided cerebral arteriogram with possible revascularization of proximal left ICA stenosis. Patient awake and alert laying in bed. Complains of left-sided weakness, stable at this time. Denies fever, chills, chest pain, dyspnea, abdominal pain, headache, dizziness, numbness/tingling, vision changes, hearing changes, tinnitus, or speech difficulty.  Patient is currently taking Plavix 75 mg once daily and Aspirin 81 mg once daily.   Past Medical History:  Diagnosis Date  . Anemia   . CAD (coronary artery disease)   . CHF (congestive heart failure) ([place])   . Diabetes ([place])   . ESRD (end stage renal disease) on dialysis ([place])   . Headache   . HTN (hypertension)   . PEA (Pulseless electrical activity) ([place]) 11/2017  . Traumatic brain injury [facility])     Past Surgical History:  Procedure Laterality Date  . CORONARY BALLOON ANGIOPLASTY N/A  03/06/2018   Procedure: CORONARY BALLOON ANGIOPLASTY;  Surgeon: Wellington Hampshire, MD;  Location: [place] [facility];  Service: Cardiovascular;  Laterality: N/A;  . CORONARY STENT INTERVENTION N/A 03/06/2018   Procedure: CORONARY STENT INTERVENTION;  Surgeon: Wellington Hampshire, MD;  Location: [place] [facility];  Service: Cardiovascular;  Laterality: N/A;  . LEFT HEART CATH AND CORONARY ANGIOGRAPHY N/A 03/06/2018   Procedure: LEFT HEART CATH AND CORONARY ANGIOGRAPHY;  Surgeon: Wellington Hampshire, MD;  Location: [place] [facility];  Service: Cardiovascular;  Laterality: N/A;    Allergies: Metformin and related; Pioglitazone; and Propoxyphene  Medications: Prior to Admission medications   Medication Sig Start Date End Date Taking? Authorizing Provider  acetaminophen (TYLENOL) 500 MG tablet Take 1,500 mg by mouth daily as needed (pain).    Yes [provider]  amLODipine (NORVASC) 5 MG tablet Take 5 mg by mouth at bedtime. 02/06/18  Yes [provider]  aspirin EC 325 MG tablet Take 325 mg by mouth daily.   Yes [provider]  atorvastatin (LIPITOR) 40 MG tablet Take 40 mg by mouth daily at 6 PM. 12/24/17  Yes [provider]  Calcium Carbonate Antacid (TUMS PO) Take 1-3 mg by mouth See admin instructions. Take 3 tablets by mouth up to three times daily with full meals, take 1 tablet with snacks/small meals   Yes [provider]  famotidine (PEPCID) 20 MG tablet Take 20 mg by mouth at bedtime. 12/22/17  Yes [provider]  furosemide (LASIX) 40 MG tablet Take 40 mg by mouth 2 (two) times daily. 02/17/18  Yes [provider]  isosorbide mononitrate (IMDUR) 60 MG 24 hr tablet Take 60  mg by mouth daily. 12/25/17  Yes [provider]  losartan (COZAAR) 100 MG tablet Take 100 mg by mouth daily. 02/25/18  Yes [provider]  metoprolol (TOPROL-XL) 200 MG 24 hr tablet Take 200 mg by mouth daily. 10/24/17  Yes [provider]  multivitamin (RENA-VIT) TABS tablet Take 1 tablet by mouth daily.   Yes [provider]  nitroGLYCERIN (NITROSTAT) 0.4 MG SL tablet Place 0.4 mg under the tongue every 5 (five) minutes as needed for chest pain.  12/25/17  Yes [provider]  polyvinyl alcohol (ARTIFICIAL TEARS) 1.4 % ophthalmic solution Place 1 drop into both eyes daily as needed for dry eyes.   Yes [provider]     Family History  Problem Relation Age of Onset  . Hypertension Father     Social History   Socioeconomic History  . Marital status: Single    Spouse name: Not on file  . Number of children: Not on file  . Years of education: Not on file  . Highest education level: Not on file  Occupational History  . Not on file  Social Needs  . Financial resource strain: Not on file  . Food insecurity:    Worry: Not on file    Inability: Not on file  . Transportation needs:    Medical: Not on file    Non-medical: Not on file  Tobacco Use  . Smoking status: Former Research scientist (life sciences)  . Smokeless tobacco: Never Used  Substance and Sexual Activity  . Alcohol use: Never    Frequency: Never  . Drug use: Never  . Sexual activity: Not Currently  Lifestyle  . Physical activity:    Days per week: Not on file    Minutes per session: Not on file  . Stress: Not on file  Relationships  . Social connections:    Talks on phone: Not on file    Gets together: Not on file    Attends religious service: Not on file    Active member of club or organization: Not on file    Attends meetings of clubs or organizations: Not on file    Relationship status: Not on file  Other Topics Concern  . Not on file  Social History Narrative  . Not on file     Review of Systems: A 12 point ROS discussed and pertinent positives are indicated in the HPI above.  All other systems are negative.  Review of Systems  Constitutional: Negative for chills and fever.  HENT: Negative for hearing loss and  tinnitus.   Eyes: Negative for visual disturbance.  Respiratory: Negative for shortness of breath and wheezing.   Cardiovascular: Negative for chest pain and palpitations.  Gastrointestinal: Negative for abdominal pain.  Neurological: Positive for weakness. Negative for dizziness, speech difficulty, numbness and headaches.  Psychiatric/Behavioral: Negative for behavioral problems and confusion.    Vital Signs: BP (!) 141/57 (BP Location: Right Arm)   Pulse 75   Temp 98.6 F (37 C) (Oral)   Resp 18   Ht 6\' 3"  (1.905 m)   Wt 149 lb 14.6 oz (68 kg)   SpO2 96%   BMI 18.74 kg/m   Physical Exam Vitals signs and nursing note reviewed.  Constitutional:      General: He is not in acute distress.    Appearance: Normal appearance.  Cardiovascular:     Rate and Rhythm: Normal rate and regular rhythm.     Pulses: Normal pulses.  Heart sounds: Normal heart sounds. No murmur.  Pulmonary:     Effort: Pulmonary effort is normal. No respiratory distress.     Breath sounds: Normal breath sounds. No wheezing.  Skin:    General: Skin is warm and dry.  Neurological:     Mental Status: He is alert and oriented to person, place, and time.  Psychiatric:        Mood and Affect: Mood normal.        Behavior: Behavior normal.        Thought Content: Thought content normal.        Judgment: Judgment normal.      MD Evaluation Airway: WNL Heart: WNL Abdomen: WNL Chest/ Lungs: WNL ASA  Classification: 3 Mallampati/Airway Score: Two   Imaging: Ct Angio Head W Or Wo Contrast  Result Date: 02/27/2018 CLINICAL DATA:  Sudden onset left-sided weakness and altered mental status following dialysis. EXAM: CT ANGIOGRAPHY HEAD AND NECK TECHNIQUE: Multidetector CT imaging of the head and neck was performed using the standard protocol during bolus administration of intravenous contrast. Multiplanar CT image reconstructions and MIPs were obtained to evaluate the vascular anatomy. Carotid stenosis  measurements (when applicable) are obtained utilizing NASCET criteria, using the distal internal carotid diameter as the denominator. CONTRAST:  177mL OMNIPAQUE IOHEXOL 300 MG/ML  SOLN COMPARISON:  Head CT 02/27/2018 FINDINGS: CTA NECK FINDINGS SKELETON: There is no bony spinal canal stenosis. No lytic or blastic lesion. OTHER NECK: Normal pharynx, larynx and major salivary glands. No cervical lymphadenopathy. Unremarkable thyroid gland. UPPER CHEST: Mild biapical emphysema AORTIC ARCH: There is moderate calcific atherosclerosis of the aortic arch. There is no aneurysm, dissection or hemodynamically significant stenosis of the visualized ascending aorta and aortic arch. Normal variant aortic arch branching pattern with the left vertebral artery arising independently from the aortic arch. The visualized proximal subclavian arteries are widely patent. RIGHT CAROTID SYSTEM: --Common carotid artery: Widely patent origin without common carotid artery dissection or aneurysm. --Internal carotid artery: No dissection, occlusion or aneurysm. Mild atherosclerotic calcification at the carotid bifurcation without hemodynamically significant stenosis. --External carotid artery: No acute abnormality. LEFT CAROTID SYSTEM: --Common carotid artery: Widely patent origin without common carotid artery dissection or aneurysm. --Internal carotid artery: No dissection, occlusion or aneurysm. There is predominantly noncalcified atherosclerosis extending into the proximal ICA, resulting in 50% stenosis. Small area of likely ulcerative plaque within the distal left ICA. --External carotid artery: No acute abnormality. VERTEBRAL ARTERIES: Codominant configuration. At least moderate stenosis of the right vertebral artery origin. The remainder of the right vertebral artery is normal. Normal left vertebral artery. CTA HEAD FINDINGS POSTERIOR CIRCULATION: --Basilar artery: Normal. --Posterior cerebral arteries: Both posterior cerebral arteries  are partially supplied by the posterior communicating arteries. --Superior cerebellar arteries: Normal. --Inferior cerebellar arteries: Normal anterior and posterior inferior cerebellar arteries. ANTERIOR CIRCULATION: --Intracranial internal carotid arteries: Atherosclerotic calcification of the internal carotid arteries at the skull base without hemodynamically significant stenosis. --Anterior cerebral arteries: Normal. Both A1 segments are present. Patent anterior communicating artery. --Middle cerebral arteries: Normal. --Posterior communicating arteries: Present bilaterally. VENOUS SINUSES: As permitted by contrast timing, patent. ANATOMIC VARIANTS: None DELAYED PHASE: No parenchymal contrast enhancement. Review of the MIP images confirms the above findings. IMPRESSION: 1. No emergent large vessel occlusion or high-grade intracranial stenosis. 2. Approximately 50% stenosis of the proximal left internal carotid artery. 3. At least moderate stenosis of the right vertebral artery origin. Aortic Atherosclerosis (ICD10-I70.0) and Emphysema (ICD10-J43.9). Electronically Signed   By: Cletus Gash.D.  On: 02/27/2018 18:04   Ct Head Wo Contrast  Result Date: 03/02/2018 CLINICAL DATA:  73 year old male with New left upper extremity weakness. Scattered tiny watershed infarcts suspected in both hemispheres on recent brain MRI. EXAM: CT HEAD WITHOUT CONTRAST TECHNIQUE: Contiguous axial images were obtained from the base of the skull through the vertex without intravenous contrast. COMPARISON:  Brain MRI and head CT 02/27/2018 FINDINGS: Brain: Loss of sulci and gray-white matter differentiation in the right superior frontal lobe most apparent on coronal image 40. No regional mass effect. Elsewhere in the right MCA territory gray-white matter differentiation appears stable and preserved. No definite acute intracranial hemorrhage. In the left hemisphere and posterior fossa gray-white matter differentiation appears  stable. Ventricle size remains normal. Vascular: Calcified atherosclerosis at the skull base. Questionable new hyperdensity of the right M1 is new compared to 02/27/2018 on series 3, image 12. However, there seems to be generalized increased vascular density when compared to the prior CT. Skull: Stable.  Chronic right lamina papyracea fracture. Sinuses/Orbits: Visualized paranasal sinuses and mastoids are stable and well pneumatized. Other: No acute orbit or scalp soft tissue findings. IMPRESSION: 1. Loss of right superior frontal lobe sulci and gray-white matter, most suspicious for cytotoxic edema such as due to new infarct. No mass effect. No definite acute hemorrhage. 2. Seemingly generalized increased density of vascular structures compared to the earlier CT. No recent IV contrast administration is known. Perhaps this might reflect dehydration. Study discussed by telephone with Dr. Amie Portland on 03/02/2018 at 03:53 . Electronically Signed   By: Genevie Ann M.D.   On: 03/02/2018 03:54   Ct Angio Neck W Or Wo Contrast  Result Date: 02/27/2018 CLINICAL DATA:  Sudden onset left-sided weakness and altered mental status following dialysis. EXAM: CT ANGIOGRAPHY HEAD AND NECK TECHNIQUE: Multidetector CT imaging of the head and neck was performed using the standard protocol during bolus administration of intravenous contrast. Multiplanar CT image reconstructions and MIPs were obtained to evaluate the vascular anatomy. Carotid stenosis measurements (when applicable) are obtained utilizing NASCET criteria, using the distal internal carotid diameter as the denominator. CONTRAST:  157mL OMNIPAQUE IOHEXOL 300 MG/ML  SOLN COMPARISON:  Head CT 02/27/2018 FINDINGS: CTA NECK FINDINGS SKELETON: There is no bony spinal canal stenosis. No lytic or blastic lesion. OTHER NECK: Normal pharynx, larynx and major salivary glands. No cervical lymphadenopathy. Unremarkable thyroid gland. UPPER CHEST: Mild biapical emphysema AORTIC ARCH:  There is moderate calcific atherosclerosis of the aortic arch. There is no aneurysm, dissection or hemodynamically significant stenosis of the visualized ascending aorta and aortic arch. Normal variant aortic arch branching pattern with the left vertebral artery arising independently from the aortic arch. The visualized proximal subclavian arteries are widely patent. RIGHT CAROTID SYSTEM: --Common carotid artery: Widely patent origin without common carotid artery dissection or aneurysm. --Internal carotid artery: No dissection, occlusion or aneurysm. Mild atherosclerotic calcification at the carotid bifurcation without hemodynamically significant stenosis. --External carotid artery: No acute abnormality. LEFT CAROTID SYSTEM: --Common carotid artery: Widely patent origin without common carotid artery dissection or aneurysm. --Internal carotid artery: No dissection, occlusion or aneurysm. There is predominantly noncalcified atherosclerosis extending into the proximal ICA, resulting in 50% stenosis. Small area of likely ulcerative plaque within the distal left ICA. --External carotid artery: No acute abnormality. VERTEBRAL ARTERIES: Codominant configuration. At least moderate stenosis of the right vertebral artery origin. The remainder of the right vertebral artery is normal. Normal left vertebral artery. CTA HEAD FINDINGS POSTERIOR CIRCULATION: --Basilar artery: Normal. --Posterior cerebral arteries:  Both posterior cerebral arteries are partially supplied by the posterior communicating arteries. --Superior cerebellar arteries: Normal. --Inferior cerebellar arteries: Normal anterior and posterior inferior cerebellar arteries. ANTERIOR CIRCULATION: --Intracranial internal carotid arteries: Atherosclerotic calcification of the internal carotid arteries at the skull base without hemodynamically significant stenosis. --Anterior cerebral arteries: Normal. Both A1 segments are present. Patent anterior communicating artery.  --Middle cerebral arteries: Normal. --Posterior communicating arteries: Present bilaterally. VENOUS SINUSES: As permitted by contrast timing, patent. ANATOMIC VARIANTS: None DELAYED PHASE: No parenchymal contrast enhancement. Review of the MIP images confirms the above findings. IMPRESSION: 1. No emergent large vessel occlusion or high-grade intracranial stenosis. 2. Approximately 50% stenosis of the proximal left internal carotid artery. 3. At least moderate stenosis of the right vertebral artery origin. Aortic Atherosclerosis (ICD10-I70.0) and Emphysema (ICD10-J43.9). Electronically Signed   By: Ulyses Jarred M.D.   On: 02/27/2018 18:04   [name]  Result Date: 03/02/2018 CLINICAL DATA:  Acute onset left arm weakness. Right frontal edema by CT. EXAM: MRI HEAD WITHOUT CONTRAST TECHNIQUE: Multiplanar, multiecho pulse sequences of the brain and surrounding structures were obtained without intravenous contrast. COMPARISON:  CT earlier same day.  MRI 02/27/2018. FINDINGS: Brain: No appreciable change since the study of 3 days ago. Acute/subacute infarctions affecting the cortical brain at the vertices, right more than left, most consistent with watershed infarctions. These do not appear progressive. There is only mild/minimal brain swelling in the right frontal region. No evidence of mass, hemorrhage, hydrocephalus or extra-axial collection. Vascular: Major vessels at the base of the brain show flow. Skull and upper cervical spine: Negative Sinuses/Orbits: Clear/normal Other: None IMPRESSION: No change since the study of 3 days ago. 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 No new area of involvement. No significant increase in swelling. No sign of hemorrhage. I should note that in review the CT angiogram of 02/27/2018, I think the stenosis at the distal bulb on the left is 70% or greater. I think there is very severe stenosis in the  left carotid siphon, likely flow limiting. Moderate stenosis in the right carotid siphon. Electronically Signed   By: Nelson Chimes M.D.   On: 03/02/2018 10:11   [name]  Result Date: 02/28/2018 CLINICAL DATA:  Initial evaluation for acute left-sided weakness. EXAM: MRI HEAD WITHOUT CONTRAST TECHNIQUE: Multiplanar, multiecho pulse sequences of the brain and surrounding structures were obtained without intravenous contrast. COMPARISON:  Prior CT and CTA from earlier same day. FINDINGS: Brain: Generalized age-related cerebral atrophy. Mild T2/FLAIR hyperintensity within the periventricular and deep white matter both cerebral hemispheres, nonspecific, but most like related chronic microvascular ischemic disease, minimal for age. Few scattered small remote bilateral cerebellar infarcts noted. Subtle scattered diffusion abnormality seen involving the parasagittal cortical gray matter of the high right frontoparietal region (series 5, images 87, 84, 83). Additional subtle diffusion abnormality seen involving the parasagittal left frontal parietal region, left cingulate (series 5, image 81). Small focus of diffusion abnormality at the anterior left frontal lobe (series 5, image 80). Finding compatible with small acute ischemic infarcts, ACA and/or ACA/MCA distributions. No associated hemorrhage or mass effect. Gray-white matter differentiation otherwise maintained. No other evidence for remote cortical infarction. No foci of susceptibility artifact to suggest acute or chronic intracranial hemorrhage. No mass lesion, midline shift or mass effect. No hydrocephalus. No extra-axial fluid collection. Pituitary gland within normal limits. Vascular: Major intracranial vascular flow voids maintained at the skull base. Diminutive vertebrobasilar system noted. Skull  and upper cervical spine: Craniocervical junction normal. Upper cervical spine within normal limits. Bone marrow signal intensity normal. No scalp soft  tissue abnormality. Sinuses/Orbits: Globes and orbital soft tissues within normal limits. Mild scattered mucosal thickening within the ethmoidal air cells. Paranasal sinuses are otherwise largely clear. Trace right mastoid effusion, of doubtful significance. Inner ear structures grossly normal. Other: None. IMPRESSION: 1. Scattered small volume diffusion abnormality involving the parasagittal cerebral hemispheres bilaterally near the vertex, right greater than left, with additional mild left frontal cortical involvement as above, compatible with acute ischemia. Changes are largely ACA distribution, with some MCA and/or ACA-MCA watershed distribution. 2. Few small remote bilateral cerebellar infarcts. 3. Underlying age-related cerebral atrophy with mild chronic microvascular ischemic disease. Electronically Signed   By: Jeannine Boga M.D.   On: 02/28/2018 01:02   Ct Head Code Stroke Wo Contrast  Result Date: 02/27/2018 CLINICAL DATA:  Code stroke.  73 y/o  M; left-sided weakness. EXAM: CT HEAD WITHOUT CONTRAST TECHNIQUE: Contiguous axial images were obtained from the base of the skull through the vertex without intravenous contrast. COMPARISON:  None. FINDINGS: Brain: No evidence of acute infarction, hemorrhage, hydrocephalus, extra-axial collection or mass lesion/mass effect. Nonspecific white matter hypodensities compatible with chronic microvascular ischemic changes volume loss of the brain. Vascular: Density within right M2 superior division may represent thrombus. Skull: Normal. Negative for fracture or focal lesion. Sinuses/Orbits: No acute finding. Other: None. ASPECTS [facility] Stroke Program Early CT Score) - Ganglionic level infarction (caudate, lentiform nuclei, internal capsule, insula, M1-M3 cortex): 7 - Supraganglionic infarction (M4-M6 cortex): 3 Total score (0-10 with 10 being normal): 10 IMPRESSION: 1. No acute intracranial abnormality identified. 2. Density within right M2 superior  division may represent thrombus. 3. Mild chronic microvascular ischemic changes and volume loss of brain. 4. ASPECTS is 10 These results were called by telephone at the time of interpretation on 02/27/2018 at 4:48 pm to Dr. Lorraine Lax, who verbally acknowledged these results. Electronically Signed   By: Kristine Garbe M.D.   On: 02/27/2018 16:49   US Abdomen Limited Ruq  Result Date: 02/28/2018 CLINICAL DATA:  37-year-old with elevated liver function studies. EXAM: ULTRASOUND ABDOMEN LIMITED RIGHT UPPER QUADRANT COMPARISON:  None. FINDINGS: Gallbladder: The gallbladder is mildly distended. There is a 3 mm gallbladder polyp. No gallstones, gallbladder wall thickening or sonographic Murphy's sign. Common bile duct: Diameter: 4 mm Liver: No focal lesion identified. Within normal limits in parenchymal echogenicity. Portal vein is patent on color Doppler imaging with normal direction of blood flow towards the liver. Incidental imaging of the right kidney demonstrates a small cyst in the lower pole measuring 8 mm maximally as well as a complex adjacent echogenic lesion measuring 12 x 10 x 10 mm. This demonstrates no gross blood flow on this limited examination. Echogenic shadowing foci are present in the mid right kidney consistent with calculi. IMPRESSION: 1. No evidence of gallstones, biliary dilatation or cholecystitis. Tiny gallbladder polyp. 2. The liver appears unremarkable. 3. Indeterminate small mass in the lower pole of the right kidney. This could reflect a complex cyst or solid lesion. Further evaluation recommended. Given the patient's renal insufficiency, noncontrast renal MRI is likely the best available imaging option. Electronically Signed   By: Richardean Sale M.D.   On: 02/28/2018 09:01    Labs:  CBC: Recent Labs    03/08/18 0308 03/09/18 0438 03/10/18 0321 03/11/18 0723  WBC 9.1 8.7 8.2 9.7  HGB 7.9* 8.1* 8.0* 8.1*  HCT 24.7* 25.3* 24.3* 25.3*  PLT  170 204 204 236     COAGS: Recent Labs    02/27/18 1658 03/09/18 1209  INR 1.08 1.1  APTT 28  --     BMP: Recent Labs    03/07/18 0309 03/08/18 0309 03/09/18 0740 03/11/18 0723  NA 131* 132* 133* 133*  K 4.8 4.3 4.1 4.0  CL 96* 97* 97* 97*  CO2 23 21* 24 23  GLUCOSE 114* 89 90 95  BUN 45* 54* 36* 40*  CALCIUM 8.0* 8.0* 8.4* 8.0*  CREATININE 11.01* 13.17* 10.68* 10.24*  GFRNONAA 4* 3* 4* 5*  GFRAA 5* 4* 5* 5*    LIVER FUNCTION TESTS: Recent Labs    02/27/18 1658 03/05/18 1332 03/08/18 0309 03/09/18 0740 03/11/18 0723  BILITOT 1.5*  --   --   --   --   AST 28  --   --   --   --   ALT 42  --   --   --   --   ALKPHOS 141*  --   --   --   --   PROT 8.9*  --   --   --   --   ALBUMIN 3.5 2.7* 2.5* 2.7* 2.7*     Assessment and Plan:  Proximal left ICA stenosis. Plan for image-guided cerebral arteriogram with possible revascularization of proximal left ICA stenosis tentatively for Tuesday 03/17/2018 at [place] with Dr. Estanislado Pandy. Patient will be NPO at midnight prior to procedure. Afebrile and WBCs WNL. P2Y12 337 PRU this AM- per Dr. Estanislado Pandy, discontinue Plavix 75 mg use, begin taking Brilinta 90 mg twice daily starting tomorrow 03/12/2018, and recheck P2Y12 03/13/2018. INR 1.1 seconds 03/09/2018- will recheck next week prior to procedure.  Risks and benefits of cerebral arteriogram with intervention were discussed with the patient including, but not limited to bleeding, infection, vascular injury, contrast induced renal failure, stroke or even death. This interventional procedure involves the use of X-rays and because of the nature of the planned procedure, it is possible that we will have prolonged use of X-ray fluoroscopy. Potential radiation risks to you include (but are not limited to) the following: - A slightly elevated risk for cancer  several years later in life. This risk is typically less than 0.5% percent. This risk is low in comparison to the normal incidence of human cancer,  which is 33% for women and 50% for men according to the [place]. - Radiation induced injury can include skin redness, resembling a rash, tissue breakdown / ulcers and hair loss (which can be temporary or permanent).  The likelihood of either of these occurring depends on the difficulty of the procedure and whether you are sensitive to radiation due to previous procedures, disease, or genetic conditions.  IF your procedure requires a prolonged use of radiation, you will be notified and given written instructions for further action.  It is your responsibility to monitor the irradiated area for the 2 weeks following the procedure and to notify your physician if you are concerned that you have suffered a radiation induced injury.   All of the patient's questions were answered, patient is agreeable to proceed. Consent signed and in chart.   Thank you for this interesting consult.  I greatly enjoyed meeting Peter [name] and look forward to participating in their care.  A copy of this report was sent to the requesting provider on this date.  Electronically Signed: Earley Abide, PA-C 03/11/2018, 11:12 AM   I spent a total of 40 Minutes  in face to face in clinical consultation, greater than 50% of which was counseling/coordinating care for proximal left ICA stenosis.

## 2018-03-12 ENCOUNTER — Encounter (HOSPITAL_COMMUNITY): Payer: Self-pay | Admitting: Interventional Radiology

## 2018-03-12 DIAGNOSIS — I63419 Cerebral infarction due to embolism of unspecified middle cerebral artery: Secondary | ICD-10-CM

## 2018-03-12 MED ORDER — DARBEPOETIN ALFA 60 MCG/0.3ML IJ SOSY
60.0000 ug | PREFILLED_SYRINGE | INTRAMUSCULAR | Status: DC
  Administered 2018-03-13: 60 ug via INTRAVENOUS
  Filled 2018-03-12: qty 0.3

## 2018-03-12 NOTE — Progress Notes (Signed)
Occupational Therapy Treatment Patient Details Name: Peter [name] MRN: [id number] DOB: 1945/10/24 Today's Date: 03/12/2018    History of present illness Pt is a 73 y.o. male admitted 02/27/18 with L-sided weakness while at dialysis. MRI shows small volume diffusion abnormality involving parasagittal cerebral hemispheres, additional L frontal cortical involvement compatible with acute ischemia; remote bilat cerebellar infarcts. Pt with 2x NSTEMIs since admission. S/p PCI of LAD on 2/21. 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 Tentative plan for L ICA stent on 03/17/18. PMH includes HTN, ESRD on HD.   OT comments  Pt performing ADL tasks standing and sitting at sink with set-upA, assist only for bathing back. Pt stood for most of ADL, but decided to sit upon feeling fatigued. Pt using RW for mobility with fair balance and able to maneuver around room. Pt wanting to sit in straigh back chair in the sun so room reorganized to allow for that. Pt with increased strength and ability to care for self so D/C info updated to include continued OT skilled services for ADL, mobility and safety in [facility] setting with family to assist during non working hours. OT to follow acutely.      Follow Up Recommendations  Home health OT;Supervision - Intermittent    Equipment Recommendations  None recommended by OT    Recommendations for Other Services      Precautions / Restrictions Precautions Precautions: Fall Restrictions Weight Bearing Restrictions: No       Mobility Bed Mobility Overal bed mobility: Independent             General bed mobility comments: in recliner upon arrival  Transfers Overall transfer level: Modified independent Equipment used: Rolling walker (2 wheeled) Transfers: Sit to/from Stand Sit to Stand: Supervision Stand pivot transfers: Supervision            Balance Overall  balance assessment: Needs assistance   Sitting balance-Leahy Scale: Good       Standing balance-Leahy Scale: Fair                             ADL either performed or assessed with clinical judgement   ADL Overall ADL's : Needs assistance/impaired     Grooming: Set up;Sitting   Upper Body Bathing: Supervision/ safety;Set up;Sitting;Standing   Lower Body Bathing: Supervison/ safety;Set up;Sitting/lateral leans;Sit to/from stand   Upper Body Dressing : Supervision/safety;Set up;Sitting   Lower Body Dressing: Supervision/safety;Set up;Sitting/lateral leans;Sit to/from stand   Toilet Transfer: Min guard;RW   Toileting- Water quality scientist and Hygiene: Min guard;Sitting/lateral lean;Sit to/from stand       Functional mobility during ADLs: Supervision/safety;Rolling walker General ADL Comments: Pt performing UB/LB ADL with set-upA     Vision   Vision Assessment?: No apparent visual deficits   Perception     Praxis      Cognition Arousal/Alertness: Awake/alert Behavior During Therapy: Flat affect Overall Cognitive Status: Within Functional Limits for tasks assessed                                 General Comments: No visual difficulty at this time.        Exercises     Shoulder Instructions       General Comments      Pertinent Vitals/ Pain       Pain Assessment: No/denies pain  Home Living                                          Prior Functioning/Environment              Frequency  Min 2X/week        Progress Toward Goals  OT Goals(current goals can now be found in the care plan section)  Progress towards OT goals: Progressing toward goals  Acute Rehab OT Goals Patient Stated Goal: Return to normal OT Goal Formulation: With patient/family Time For Goal Achievement: 03/14/18 Potential to Achieve Goals: Good ADL Goals Pt Will Perform Grooming: with supervision;standing Pt Will Perform  Upper Body Dressing: with supervision;sitting Pt Will Perform Lower Body Dressing: with supervision;sit to/from stand Pt Will Transfer to Toilet: with supervision;regular height toilet;ambulating Pt Will Perform Toileting - Clothing Manipulation and hygiene: with supervision;sit to/from stand Additional ADL Goal #1: Pt will locate 4/5 ADL items in left visual field with Min cues Additional ADL Goal #2: Pt will incorporate LUE into ADLs 75% of time with 2-3 cues  Plan Discharge plan needs to be updated    Co-evaluation                 AM-PAC OT "6 Clicks" Daily Activity     Outcome Measure   Help from another person eating meals?: A Little Help from another person taking care of personal grooming?: A Little Help from another person toileting, which includes using toliet, bedpan, or urinal?: A Little Help from another person bathing (including washing, rinsing, drying)?: A Little Help from another person to put on and taking off regular upper body clothing?: A Little Help from another person to put on and taking off regular lower body clothing?: A Little 6 Click Score: 18    End of Session Equipment Utilized During Treatment: Rolling walker;Gait belt  OT Visit Diagnosis: Unsteadiness on feet (R26.81);Muscle weakness (generalized) (M62.81) Hemiplegia - Right/Left: Left Hemiplegia - dominant/non-dominant: Non-Dominant Hemiplegia - caused by: Cerebral infarction   Activity Tolerance Patient tolerated treatment well   Patient Left in chair;with call bell/phone within reach   Nurse Communication          Time: 1275-1700 OT Time Calculation (min): 45 min  Charges: OT General Charges $OT Visit: 1 Visit OT Treatments $Self Care/Home Management : 23-37 mins $Neuromuscular Re-education: 8-22 mins  Darryl Nestle) Marsa Aris OTR/L Acute Rehabilitation Services Pager: [phone] Office: [phone]   Fredda Hammed 03/12/2018, 3:22 PM

## 2018-03-12 NOTE — Progress Notes (Signed)
IP rehab admissions - Noted patient is making excellent progress and recommendations from PT today are for [facility] therapies.  Patient will not need an acute inpatient rehab stay.  Call me for questions.  [phone]

## 2018-03-12 NOTE — Progress Notes (Signed)
PROGRESS NOTE  Peter [name] YHC:[id number] DOB: 17-Nov-1945 DOA: 02/27/2018 PCP: Patient, No Pcp Per  Brief Summary:  66 AAM  H/o ESRD on HD x 3 years-prior on PD for 3 years, systolic chf ef 51-76% 2/2 CAD ans Stent x1  02/2016, htn hld Presented to the hospital 02/27/2018 with left sided weakness, found to have Acute stroke Later developed chest pain, found to have NSTEMI, s/p DES Left ICA stenosis, scheduled to have left ICA stent placement by [name] on 3/3   CIR vs snf placement after ICA stent Anemia, checking fobt Renal mass needs follow up  HPI/Recap of past 24 hours:  No complaints understands that he is too high level of function and probably will be going home eventually with home health No other new issues  Assessment/Plan: Principal Problem:   Cerebrovascular accident (CVA) ([place]) Active Problems:   Elevated troponin   Elevated LFTs   HTN (hypertension)   ESRD on dialysis ([place])   CAD (coronary artery disease)   Diabetes ([place])   CHF (congestive heart failure) ([place])   Traumatic brain injury ([place])   Anemia of chronic disease   Right kidney mass   TIA (transient ischemic attack)   Chronic systolic CHF (congestive heart failure) ([place])   NSTEMI (non-ST elevated myocardial infarction) ([place])   Carotid stenosis   Status post coronary artery stent placement   FTT (failure to thrive) in adult    Acute Stroke:  Multiple infarcts - embolic - unknown source. (presenting symptom) -patient presented with left sided weakness after dialysis, He did not receive IV t-PA due to mild deficits --aspirin 325 mg daily prior to admission, then on aspirin 325 mg daily and plavix, then on heparin drip/asa 81/plavix, now s/p cardiac stent, on asa 81mg  /plavix daily --changed to [place] per IR-Neuro for anticpated--- cerebral stenting early next week (3/3) by [name],  -tele has been sinus rhythm since hospitalization -keep permissive hypertension until ICA stenting -needs  per neuro TEE/ holter monitor/ loop record er (pease refer to neurology note by Dr Rosalin Hawking on 2/24) -Patient reviewed by CIR-to higher level of function-probably home with home health status post procedure  NSTEMI (2/18-2/19): -had chest pain midnight (2/18-2/19), troponin peaked at 6.4  -s/p cardiac cath on 2/21 with PCI/DESx1 to the LAD with scoring balloon to ISR in the ostium of OM1. Continue to treat RCA disease medically. Plan for DAPT with ASA/plavix for at least one year   -cardiology recommend 30 day event monitor and TEE/possible ICD evaluation on outpatient basis through his primary cardiology [name] -Need to follow up with [name] in two week after discharge -No new changes today  Chronic systolic heart failure: ef 30-35% volume managed by dialysis F/u with primary cardiology   HTN:  On toprol-xl, cozaar, imdur, norvasc, getting dialysis  ESRD on HD MWF Anemia of chronic disease, hgb 7.9,  He is getting aranasp per nephrology.   no overt bleeding prbc transfusion and Dialysis per nephrology  Right renal mass. Incidental finding on ab [place] from 2/15 "Indeterminate small mass in the lower pole of the right kidney. This could reflect a complex cyst or solid lesion. Further evaluation recommended. Given the patient's renal insufficiency, noncontrast renal MRI is likely the best available imaging option." Not sure is he still make urine, will try to obtain ua Will need to follow up as outpatient, daughter is made aware to follow up on right renal mass.   Chronic nasal congestion:  Continue flonase, add  on claritin   FTT;  He reports just recovering from a prolonged hospitalization last year, started driving two weeks ago prior to this hospitalization CIR eval  Code Status: full Family Communication: patient only today Disposition Plan: to have left ICA placement early next week, then CIR  Vs SNF pending PT eval and insurance  auth   Consultants:  Cardiology/EP [name]  Neurology  nephrology  IR [name]  CIR  Procedures:  Dialysis  Cardiac cath on 2/21 with PCI/DESx1 to the LAD with scoring balloon to ISR in the ostium of OM1.  Cerebral angiogram on 2/25  Antibiotics:  none   Objective: BP (!) 164/64 (BP Location: Right Arm)   Pulse 72   Temp 98 F (36.7 C) (Oral)   Resp 20   Ht 6\' 3"  (1.905 m)   Wt 69.3 kg   SpO2 100%   BMI 19.09 kg/m  No intake or output data in the 24 hours ending 03/12/18 1600 Filed Weights   03/11/18 0700 03/11/18 1000 03/12/18 0228  Weight: 69.9 kg 68 kg 69.3 kg    Exam: No significant changes to exam from 4/26   Awake coherent in nad  eomi ncat looks about stated age  cta b no added sound  abd soft nt nd no rebound no guard  No LE edema  Neuro intact no focal neuro deficit--able to raise both hands above head  Data Reviewed: Basic Metabolic Panel: Recent Labs  Lab 03/07/18 0309 03/08/18 0309 03/09/18 0740 03/11/18 0723  NA 131* 132* 133* 133*  K 4.8 4.3 4.1 4.0  CL 96* 97* 97* 97*  CO2 23 21* 24 23  GLUCOSE 114* 89 90 95  BUN 45* 54* 36* 40*  CREATININE 11.01* 13.17* 10.68* 10.24*  CALCIUM 8.0* 8.0* 8.4* 8.0*  PHOS  --  5.0* 4.8* 4.9*   Liver Function Tests: Recent Labs  Lab 03/08/18 0309 03/09/18 0740 03/11/18 0723  ALBUMIN 2.5* 2.7* 2.7*   No results for input(s): LIPASE, AMYLASE in the last 168 hours. No results for input(s): AMMONIA in the last 168 hours. CBC: Recent Labs  Lab 03/07/18 0309 03/08/18 0308 03/09/18 0438 03/10/18 0321 03/11/18 0723  WBC 9.2 9.1 8.7 8.2 9.7  NEUTROABS  --   --  3.5 3.1  --   HGB 9.2* 7.9* 8.1* 8.0* 8.1*  HCT 28.4* 24.7* 25.3* 24.3* 25.3*  MCV 88.5 89.2 88.8 89.3 90.0  PLT 202 170 204 204 236   Cardiac Enzymes:   No results for input(s): CKTOTAL, CKMB, CKMBINDEX, TROPONINI in the last 168 hours. BNP (last 3 results) No results for input(s): BNP in the last 8760  hours.  ProBNP (last 3 results) No results for input(s): PROBNP in the last 8760 hours.  CBG: Recent Labs  Lab 03/06/18 1319 03/06/18 1630 03/06/18 2116 03/07/18 0700 03/11/18 1040  GLUCAP 105* 108* 109* 119* 73    Recent Results (from the past 240 hour(s))  MRSA PCR Screening     Status: None   Collection Time: 03/05/18 10:44 PM  Result Value Ref Range Status   MRSA by PCR NEGATIVE NEGATIVE Final    Comment:        The GeneXpert MRSA Assay (FDA approved for NASAL specimens only), is one component of a comprehensive MRSA colonization surveillance program. It is not intended to diagnose MRSA infection nor to guide or monitor treatment for MRSA infections. Performed at [facility], [place] [address] [place], [place] [address]  Studies: No results found.  Scheduled Meds: . amLODipine  5 mg Oral QHS  . aspirin EC  81 mg Oral Daily  . atorvastatin  40 mg Oral q1800  . [START ON 03/13/2018] darbepoetin (ARANESP) injection - DIALYSIS  60 mcg Intravenous Q Fri-HD  . famotidine  20 mg Oral QHS  . fluticasone  2 spray Each Nare BID  . isosorbide mononitrate  60 mg Oral Daily  . loratadine  10 mg Oral Q48H  . losartan  100 mg Oral Daily  . metoprolol succinate  200 mg Oral Daily  . multivitamin  1 tablet Oral Daily  . polyethylene glycol  17 g Oral Daily  . senna-docusate  1 tablet Oral BID  . sodium chloride flush  3 mL Intravenous Q12H  . ticagrelor  90 mg Oral BID    Continuous Infusions: . sodium chloride       Time spent: 5mins, case discussed with  interventional radiology and CIR coordinator  I have personally reviewed and interpreted on  03/12/2018 daily labs, imagings as discussed above under date review session and assessment and plans.  I reviewed all nursing notes, pharmacy notes, consultant notes,  vitals, pertinent old records  I have discussed plan of care as described above with RN , patient and daughter over the phone  on  03/12/2018   Verneita Griffes, MD [facility] 4:00 PM

## 2018-03-12 NOTE — Progress Notes (Signed)
Patient with concerns for how he will get to HD once discharged, as he previously drove himself. SW Intern researched transportation resources in [place], [place], and called [place] regarding options. HPTS stated that outpatient-HD social worker should assist patient, as applications can be expedited when completed at [facility]. Intern suggested patient reach out to [facility] social worker to discuss options, and patient was agreeable.   Arlis Porta, Social Work Intern

## 2018-03-12 NOTE — Progress Notes (Signed)
Patient ID: Peter [name], male   DOB: 05-31-1945, 73 y.o.   MRN: [id number] S: feels well and sitting in chair. O:BP (!) 160/52 (BP Location: Left Arm)   Pulse 68   Temp 99.2 F (37.3 C) (Oral)   Resp 20   Ht 6\' 3"  (1.905 m)   Wt 69.3 kg   SpO2 98%   BMI 19.09 kg/m  No intake or output data in the 24 hours ending 03/12/18 1248 Intake/Output: I/O last 3 completed shifts: In: 243 [P.O.:240; I.V.:3] Out: 1553 [Other:1553]  Intake/Output this shift:  No intake/output data recorded. Weight change: -2.217 kg Gen: NAD CVS: no rub Resp: cta Abd: benign Ext: no edema  Recent Labs  Lab 03/05/18 1332 03/07/18 0309 03/08/18 0309 03/09/18 0740 03/11/18 0723  NA 133* 131* 132* 133* 133*  K 4.7 4.8 4.3 4.1 4.0  CL 93* 96* 97* 97* 97*  CO2 24 23 21* 24 23  GLUCOSE 124* 114* 89 90 95  BUN 63* 45* 54* 36* 40*  CREATININE 14.74* 11.01* 13.17* 10.68* 10.24*  ALBUMIN 2.7*  --  2.5* 2.7* 2.7*  CALCIUM 7.8* 8.0* 8.0* 8.4* 8.0*  PHOS 5.5*  --  5.0* 4.8* 4.9*   Liver Function Tests: Recent Labs  Lab 03/08/18 0309 03/09/18 0740 03/11/18 0723  ALBUMIN 2.5* 2.7* 2.7*   No results for input(s): LIPASE, AMYLASE in the last 168 hours. No results for input(s): AMMONIA in the last 168 hours. CBC: Recent Labs  Lab 03/07/18 0309 03/08/18 0308 03/09/18 0438 03/10/18 0321 03/11/18 0723  WBC 9.2 9.1 8.7 8.2 9.7  NEUTROABS  --   --  3.5 3.1  --   HGB 9.2* 7.9* 8.1* 8.0* 8.1*  HCT 28.4* 24.7* 25.3* 24.3* 25.3*  MCV 88.5 89.2 88.8 89.3 90.0  PLT 202 170 204 204 236   Cardiac Enzymes: No results for input(s): CKTOTAL, CKMB, CKMBINDEX, TROPONINI in the last 168 hours. CBG: Recent Labs  Lab 03/06/18 1319 03/06/18 1630 03/06/18 2116 03/07/18 0700 03/11/18 1040  GLUCAP 105* 108* 109* 119* 73    Iron Studies: No results for input(s): IRON, TIBC, TRANSFERRIN, FERRITIN in the last 72 hours. Studies/Results: No results found. Marland Kitchen amLODipine  5 mg Oral QHS  . aspirin EC  81 mg Oral  Daily  . atorvastatin  40 mg Oral q1800  . [START ON 03/13/2018] darbepoetin (ARANESP) injection - DIALYSIS  60 mcg Intravenous Q Fri-HD  . famotidine  20 mg Oral QHS  . fluticasone  2 spray Each Nare BID  . isosorbide mononitrate  60 mg Oral Daily  . loratadine  10 mg Oral Q48H  . losartan  100 mg Oral Daily  . metoprolol succinate  200 mg Oral Daily  . multivitamin  1 tablet Oral Daily  . polyethylene glycol  17 g Oral Daily  . senna-docusate  1 tablet Oral BID  . sodium chloride flush  3 mL Intravenous Q12H  . ticagrelor  90 mg Oral BID    BMET    Component Value Date/Time   NA 133 (L) 03/11/2018 0723   K 4.0 03/11/2018 0723   CL 97 (L) 03/11/2018 0723   CO2 23 03/11/2018 0723   GLUCOSE 95 03/11/2018 0723   BUN 40 (H) 03/11/2018 0723   CREATININE 10.24 (H) 03/11/2018 0723   CALCIUM 8.0 (L) 03/11/2018 0723   GFRNONAA 5 (L) 03/11/2018 0723   GFRAA 5 (L) 03/11/2018 0723   CBC    Component Value Date/Time   WBC 9.7 03/11/2018 0723  RBC 2.81 (L) 03/11/2018 0723   HGB 8.1 (L) 03/11/2018 0723   HCT 25.3 (L) 03/11/2018 0723   PLT 236 03/11/2018 0723   MCV 90.0 03/11/2018 0723   MCH 28.8 03/11/2018 0723   MCHC 32.0 03/11/2018 0723   RDW 15.7 (H) 03/11/2018 0723   LYMPHSABS 2.7 03/10/2018 0321   MONOABS 1.2 (H) 03/10/2018 0321   EOSABS 1.1 (H) 03/10/2018 0321   BASOSABS 0.1 03/10/2018 0321    Dialysis Orders: [facility] (will need to call in am for current orders) 3.5h R IJ TDC Patient reported dry weight 155 lbs  Assessment/ Plan:  1. Acute CVA- s/p cerebral arteriogram with 90% stenosis of prox LICA, 18% prox RICA, and 50% in distal RICA, severe stenosis of RVA at origin and 28% stenosis of LICA cavernous segment per Dr. Estanislado Pandy.  1. For LICA stent on 08/17/35 by Dr. Estanislado Pandy 2. NSTEMI- s/p PCI of LAD on 03/06/18 on asa/plavix 3. ESRDcontinue with HD qMWF.  He is actually below edw.  Will need new edw at time of discharge.  4. Anemia:on  aranesp but also had ABLA due to GIB and transfuse prn 5. CKD-MBD:stable 6. Nutrition:renal diet 7. Hypertension:stable 8. Disposition- will need to reassess after stent placement but getting stronger daily.   [name], MD [employer] [phone]

## 2018-03-12 NOTE — Care Management (Signed)
#   7.   S/W  DHONOVIN  @  [facility]  SOLUTION RX # [phone]   BRILINTA    90 MG  BID COVER- YES CO-PAY- $ 8.95 TIER- NO PRIOR APPROVAL-  NO  PREFERRED PHARMACY : YES [facility]

## 2018-03-12 NOTE — Progress Notes (Signed)
Physical Therapy Treatment Patient Details Name: Peter [name] MRN: [id number] DOB: 02-12-1945 Today's Date: 03/12/2018    History of Present Illness Pt is a 73 y.o. male admitted 02/27/18 with L-sided weakness while at dialysis. MRI shows small volume diffusion abnormality involving parasagittal cerebral hemispheres, additional L frontal cortical involvement compatible with acute ischemia; remote bilat cerebellar infarcts. Pt with 2x NSTEMIs since admission. S/p PCI of LAD on 2/21. 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 Tentative plan for L ICA stent on 03/17/18. PMH includes HTN, ESRD on HD.   PT Comments    Pt progressing well with mobility. Mod indep with ADLs and ambulation using RW. Pt reluctant to perform further gait and balance training with SPC, stating, "I don't feel comfortable with it." Increased time spent discussing discharge plan; pt reports he will likely be able to d/c to daughter's home in [place]. Her and her husband work during the day, but she would be able to assist with meal prep and other tasks as needed. Pt's main concern is transport to/from HD (MWF schedule) in [place]. Do not feel pt currently requires post-acute rehab at [facility] or SNF-levels. Would benefit from OP neuro PT to address higher level balance deficits if transportation available (therefore, HHPT currently recommended). CM and SW updated on recs.   Follow Up Recommendations  Home health PT;Supervision - Intermittent     Equipment Recommendations  None recommended by PT    Recommendations for Other Services       Precautions / Restrictions Restrictions Weight Bearing Restrictions: No    Mobility  Bed Mobility Overal bed mobility: Independent                Transfers Overall transfer level: Modified independent Equipment used: Rolling walker (2 wheeled)                 Ambulation/Gait Ambulation/Gait assistance: Modified independent (Device/Increase time)       Gait velocity: 1.22 ft/sec Gait velocity interpretation: <1.31 ft/sec, indicative of household ambulator General Gait Details: Mod indep ambulating with RW; slow, controlled, only minimally increasing gait speed with cues. Pt declined further gait training with SPC, stating, "I don't feel comfortable with it." (encouraged this for next session)   Stairs             Wheelchair Mobility    Modified Rankin (Stroke Patients Only) Modified Rankin (Stroke Patients Only) Pre-Morbid Rankin Score: No symptoms Modified Rankin: Moderate disability     Balance Overall balance assessment: Needs assistance Sitting-balance support: No upper extremity supported;Feet supported Sitting balance-Leahy Scale: Good       Standing balance-Leahy Scale: Fair Standing balance comment: Can static stand at sink for dynamic ADL tasks without UE support                            Cognition Arousal/Alertness: Awake/alert Behavior During Therapy: Flat affect Overall Cognitive Status: Within Functional Limits for tasks assessed                                        Exercises      General Comments        Pertinent Vitals/Pain Pain Assessment: No/denies pain    Home Living  Prior Function            PT Goals (current goals can now be found in the care plan section) Acute Rehab PT Goals Patient Stated Goal: Return to normal PT Goal Formulation: With patient Time For Goal Achievement: 03/20/18 Potential to Achieve Goals: Good Progress towards PT goals: Progressing toward goals    Frequency    Min 4X/week      PT Plan Discharge plan needs to be updated    Co-evaluation              AM-PAC PT "6 Clicks" Mobility   Outcome Measure  Help needed turning from your back to your side while in a flat bed without using  bedrails?: None Help needed moving from lying on your back to sitting on the side of a flat bed without using bedrails?: None Help needed moving to and from a bed to a chair (including a wheelchair)?: None Help needed standing up from a chair using your arms (e.g., wheelchair or bedside chair)?: None Help needed to walk in hospital room?: None Help needed climbing 3-5 steps with a railing? : A Little 6 Click Score: 23    End of Session Equipment Utilized During Treatment: Gait belt Activity Tolerance: Patient tolerated treatment well Patient left: in chair;with call bell/phone within reach Nurse Communication: Mobility status PT Visit Diagnosis: Other abnormalities of gait and mobility (R26.89);Muscle weakness (generalized) (M62.81)     Time: 2330-0762 PT Time Calculation (min) (ACUTE ONLY): 43 min  Charges:  $Gait Training: 8-22 mins $Therapeutic Activity: 8-22 mins $Self Care/Home Management: Allyn, PT, DPT Acute Rehabilitation Services  Pager [phone] Office [place] 03/12/2018, 10:14 AM

## 2018-03-13 LAB — RENAL FUNCTION PANEL
Albumin: 2.6 g/dL — ABNORMAL LOW (ref 3.5–5.0)
Anion gap: 15 (ref 5–15)
BUN: 39 mg/dL — ABNORMAL HIGH (ref 8–23)
CO2: 23 mmol/L (ref 22–32)
Calcium: 8.3 mg/dL — ABNORMAL LOW (ref 8.9–10.3)
Chloride: 100 mmol/L (ref 98–111)
Creatinine, Ser: 11.08 mg/dL — ABNORMAL HIGH (ref 0.61–1.24)
GFR calc Af Amer: 5 mL/min — ABNORMAL LOW (ref >60)
GFR calc non Af Amer: 4 mL/min — ABNORMAL LOW (ref >60)
GLUCOSE: 109 mg/dL — AB (ref 70–99)
Phosphorus: 4.7 mg/dL — ABNORMAL HIGH (ref 2.5–4.6)
Potassium: 3.8 mmol/L (ref 3.5–5.1)
Sodium: 138 mmol/L (ref 135–145)

## 2018-03-13 LAB — CBC
HCT: 22.7 % — ABNORMAL LOW (ref 39.0–52.0)
Hemoglobin: 7.5 g/dL — ABNORMAL LOW (ref 13.0–17.0)
MCH: 29.4 pg (ref 26.0–34.0)
MCHC: 33 g/dL (ref 30.0–36.0)
MCV: 89 fL (ref 80.0–100.0)
Platelets: 244 10*3/uL (ref 150–400)
RBC: 2.55 MIL/uL — ABNORMAL LOW (ref 4.22–5.81)
RDW: 15.9 % — ABNORMAL HIGH (ref 11.5–15.5)
WBC: 9.2 10*3/uL (ref 4.0–10.5)
nRBC: 0 % (ref 0.0–0.2)

## 2018-03-13 LAB — PLATELET INHIBITION P2Y12: Platelet Function  P2Y12: 226 [PRU] (ref 182–335)

## 2018-03-13 MED ORDER — TICAGRELOR 90 MG PO TABS
180.0000 mg | ORAL_TABLET | Freq: Once | ORAL | Status: AC
  Administered 2018-03-13: 180 mg via ORAL
  Filled 2018-03-13: qty 2

## 2018-03-13 MED ORDER — DARBEPOETIN ALFA 60 MCG/0.3ML IJ SOSY
PREFILLED_SYRINGE | INTRAMUSCULAR | Status: AC
  Administered 2018-03-13: 60 ug via INTRAVENOUS
  Filled 2018-03-13: qty 0.3

## 2018-03-13 MED ORDER — HEPARIN SODIUM (PORCINE) 1000 UNIT/ML IJ SOLN
INTRAMUSCULAR | Status: AC
  Administered 2018-03-13: 3500 [IU]
  Filled 2018-03-13: qty 4

## 2018-03-13 MED ORDER — IPRATROPIUM BROMIDE 0.03 % NA SOLN
2.0000 | Freq: Two times a day (BID) | NASAL | Status: DC
  Filled 2018-03-13: qty 30

## 2018-03-13 MED ORDER — AZELASTINE HCL 0.1 % NA SOLN
1.0000 | Freq: Two times a day (BID) | NASAL | Status: DC
  Administered 2018-03-14 – 2018-03-15 (×3): 1 via NASAL
  Filled 2018-03-13 (×2): qty 30

## 2018-03-13 NOTE — Progress Notes (Signed)
IP rehab admissions - Please see my progress note from 2/27.  Patient has progressed to where he no longer needs an acute inpatient rehab stay.  I will sign off for CIR at this time.  [phone]

## 2018-03-13 NOTE — Procedures (Signed)
I was present at this dialysis session. I have reviewed the session itself and made appropriate changes. He is doing well and without complaints.  Plan for LICA stent on 03/19/30 per Dr. Estanislado Pandy.  Vital signs in last 24 hours:  Temp:  [98 F (36.7 C)] 98 F (36.7 C) (02/28 0610) Pulse Rate:  [67-72] 67 (02/28 0610) Resp:  [20] 20 (02/28 0610) BP: (150-164)/(59-64) 164/59 (02/28 0610) SpO2:  [97 %-100 %] 97 % (02/28 0610) Weight change:  Filed Weights   03/11/18 0700 03/11/18 1000 03/12/18 0228  Weight: 69.9 kg 68 kg 69.3 kg    Recent Labs  Lab 03/13/18 0646  NA 138  K 3.8  CL 100  CO2 23  GLUCOSE 109*  BUN 39*  CREATININE 11.08*  CALCIUM 8.3*  PHOS 4.7*    Recent Labs  Lab 03/09/18 0438 03/10/18 0321 03/11/18 0723 03/13/18 0646  WBC 8.7 8.2 9.7 9.2  NEUTROABS 3.5 3.1  --   --   HGB 8.1* 8.0* 8.1* 7.5*  HCT 25.3* 24.3* 25.3* 22.7*  MCV 88.8 89.3 90.0 89.0  PLT 204 204 236 244    Scheduled Meds: . amLODipine  5 mg Oral QHS  . aspirin EC  81 mg Oral Daily  . atorvastatin  40 mg Oral q1800  . darbepoetin (ARANESP) injection - DIALYSIS  60 mcg Intravenous Q Fri-HD  . famotidine  20 mg Oral QHS  . fluticasone  2 spray Each Nare BID  . isosorbide mononitrate  60 mg Oral Daily  . loratadine  10 mg Oral Q48H  . losartan  100 mg Oral Daily  . metoprolol succinate  200 mg Oral Daily  . multivitamin  1 tablet Oral Daily  . polyethylene glycol  17 g Oral Daily  . senna-docusate  1 tablet Oral BID  . sodium chloride flush  3 mL Intravenous Q12H  . ticagrelor  90 mg Oral BID   Continuous Infusions: . sodium chloride     PRN Meds:.sodium chloride, acetaminophen, nitroGLYCERIN, polyvinyl alcohol, sodium chloride flush   [name],  MD 03/13/2018, 8:16 AM

## 2018-03-13 NOTE — Plan of Care (Signed)
  Problem: Education: Goal: Knowledge of disease or condition will improve Outcome: Progressing Goal: Knowledge of secondary prevention will improve Outcome: Progressing Goal: Knowledge of patient specific risk factors addressed and post discharge goals established will improve Outcome: Progressing   Problem: Education: Goal: Understanding of cardiac disease, CV risk reduction, and recovery process will improve Outcome: Progressing Goal: Understanding of medication regimen will improve Outcome: Progressing

## 2018-03-13 NOTE — Progress Notes (Signed)
Patient is scheduled for an image-guided cerebral arteriogram with possible revascularization of proximal left ICA stenosis tentatively for 03/17/2018 with Dr. Estanislado Pandy.  P2Y12 226 PRU this AM- discussed case with Dr. Estanislado Pandy.  Per Dr. Estanislado Pandy- load with Brilinta 180 mg once today, continue Brilinta 90 mg twice daily (including today), and recheck P2Y12 on Monday 03/16/2018. RN made aware.  Bea Graff Rheya Minogue, PA-C 03/13/2018, 11:52 AM

## 2018-03-13 NOTE — Progress Notes (Signed)
PT Cancellation Note  Patient Details Name: Peter [name] MRN: [id number] DOB: 06-Apr-1945   Cancelled Treatment:    Reason Eval/Treat Not Completed: Patient at procedure or test/unavailable (HD). Will follow-up for PT treatment as schedule permits.  Mabeline Caras, PT, DPT Acute Rehabilitation Services  Pager [phone] Office [place] 03/13/2018, 7:50 AM

## 2018-03-13 NOTE — Plan of Care (Signed)
Ambulated with standby assist in hall 200 feet, ambulates in room and bathes with set up assist, hemodialyses today with no complaints of fatigue or discomfort.

## 2018-03-14 LAB — CBC WITH DIFFERENTIAL/PLATELET
Abs Immature Granulocytes: 0.05 10*3/uL (ref 0.00–0.07)
BASOS PCT: 1 %
Basophils Absolute: 0.1 10*3/uL (ref 0.0–0.1)
Eosinophils Absolute: 1.2 10*3/uL — ABNORMAL HIGH (ref 0.0–0.5)
Eosinophils Relative: 14 %
HCT: 29.1 % — ABNORMAL LOW (ref 39.0–52.0)
Hemoglobin: 9.3 g/dL — ABNORMAL LOW (ref 13.0–17.0)
Immature Granulocytes: 1 %
Lymphocytes Relative: 28 %
Lymphs Abs: 2.5 10*3/uL (ref 0.7–4.0)
MCH: 28.6 pg (ref 26.0–34.0)
MCHC: 32 g/dL (ref 30.0–36.0)
MCV: 89.5 fL (ref 80.0–100.0)
Monocytes Absolute: 0.9 10*3/uL (ref 0.1–1.0)
Monocytes Relative: 10 %
NRBC: 0 % (ref 0.0–0.2)
Neutro Abs: 4.4 10*3/uL (ref 1.7–7.7)
Neutrophils Relative %: 46 %
Platelets: 281 10*3/uL (ref 150–400)
RBC: 3.25 MIL/uL — AB (ref 4.22–5.81)
RDW: 15.6 % — ABNORMAL HIGH (ref 11.5–15.5)
WBC: 9.1 10*3/uL (ref 4.0–10.5)

## 2018-03-14 LAB — OCCULT BLOOD X 1 CARD TO LAB, STOOL: Fecal Occult Bld: NEGATIVE

## 2018-03-14 MED ORDER — CHLORHEXIDINE GLUCONATE CLOTH 2 % EX PADS
6.0000 | MEDICATED_PAD | Freq: Every day | CUTANEOUS | Status: DC
  Administered 2018-03-15: 6 via TOPICAL

## 2018-03-14 NOTE — Plan of Care (Signed)
  Problem: Education: Goal: Knowledge of disease or condition will improve 03/14/2018 1608 by Phineas Real, RN Outcome: Progressing 03/14/2018 0926 by Phineas Real, RN Outcome: Progressing   Problem: Education: Goal: Knowledge of secondary prevention will improve 03/14/2018 1608 by Phineas Real, RN Outcome: Progressing 03/14/2018 0926 by Phineas Real, RN Outcome: Progressing

## 2018-03-14 NOTE — Progress Notes (Signed)
Physical Therapy Treatment Patient Details Name: Peter [name] MRN: [id number] DOB: September 16, 1945 Today's Date: 03/14/2018    History of Present Illness Pt is a 73 y.o. male admitted 02/27/18 with L-sided weakness while at dialysis. MRI shows small volume diffusion abnormality involving parasagittal cerebral hemispheres, additional L frontal cortical involvement compatible with acute ischemia; remote bilat cerebellar infarcts. Pt with 2x NSTEMIs since admission. S/p PCI of LAD on 2/21. 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 Tentative plan for L ICA stent on 03/17/18. PMH includes HTN, ESRD on HD.    PT Comments    Patient seen for mobility progression. This session focused on gait training with SPC. Pt is making progress toward PT goals and tolerated session well. Pt does continue to demonstrate balance deficits especially with L horizontal head turns with single UE support. No overt LOB during session. Current plan remains appropriate.    Follow Up Recommendations  Home health PT;Supervision - Intermittent (OP Neuro PT if transportation available)     Equipment Recommendations  None recommended by PT    Recommendations for Other Services       Precautions / Restrictions Precautions Precautions: Fall Restrictions Weight Bearing Restrictions: No    Mobility  Bed Mobility               General bed mobility comments: pt sitting EOB upon arrival  Transfers Overall transfer level: Needs assistance Equipment used: Straight cane Transfers: Sit to/from Stand Sit to Stand: Supervision         General transfer comment: pt independent with RW in room; supervision for safety with use of SPC upon standing  Ambulation/Gait Ambulation/Gait assistance: Min guard Gait Distance (Feet): 200 Feet Assistive device: Straight cane Gait Pattern/deviations: Step-through pattern;Decreased stride  length;Trunk flexed     General Gait Details: decreased cadence; unsteady with horizontal head turns but no overt LOB    Stairs Stairs: Yes Stairs assistance: Min assist;Min guard Stair Management: One rail Right;Step to pattern;Forwards;With cane Number of Stairs: 10 General stair comments: cues for sequencing with use of SPC; min A to descend; rest break needed after stair training; HR and SpO2 WNL   Wheelchair Mobility    Modified Rankin (Stroke Patients Only) Modified Rankin (Stroke Patients Only) Pre-Morbid Rankin Score: No symptoms Modified Rankin: Moderate disability     Balance Overall balance assessment: Needs assistance Sitting-balance support: No upper extremity supported;Feet supported Sitting balance-Leahy Scale: Good       Standing balance-Leahy Scale: Fair Standing balance comment: pt is able to static stand without UE support                             Cognition Arousal/Alertness: Awake/alert Behavior During Therapy: WFL for tasks assessed/performed Overall Cognitive Status: Within Functional Limits for tasks assessed                                        Exercises      General Comments        Pertinent Vitals/Pain Pain Assessment: No/denies pain    Home Living                      Prior Function            PT Goals (current goals can now be  found in the care plan section) Acute Rehab PT Goals Patient Stated Goal: Return to normal Progress towards PT goals: Progressing toward goals    Frequency    Min 4X/week      PT Plan      Co-evaluation              AM-PAC PT "6 Clicks" Mobility   Outcome Measure  Help needed turning from your back to your side while in a flat bed without using bedrails?: None Help needed moving from lying on your back to sitting on the side of a flat bed without using bedrails?: None Help needed moving to and from a bed to a chair (including a wheelchair)?:  None Help needed standing up from a chair using your arms (e.g., wheelchair or bedside chair)?: None Help needed to walk in hospital room?: None Help needed climbing 3-5 steps with a railing? : A Little 6 Click Score: 23    End of Session Equipment Utilized During Treatment: Gait belt Activity Tolerance: Patient tolerated treatment well Patient left: in chair;with call bell/phone within reach Nurse Communication: Mobility status PT Visit Diagnosis: Other abnormalities of gait and mobility (R26.89);Muscle weakness (generalized) (M62.81)     Time: 4469-5072 PT Time Calculation (min) (ACUTE ONLY): 37 min  Charges:  $Gait Training: 23-37 mins                     Earney Navy, PTA Acute Rehabilitation Services Pager: [phone] Office: [phone]     Darliss Cheney 03/14/2018, 11:30 AM

## 2018-03-14 NOTE — Plan of Care (Signed)
  Problem: Education: Goal: Knowledge of secondary prevention will improve Outcome: Progressing   Problem: Education: Goal: Knowledge of disease or condition will improve Outcome: Progressing   

## 2018-03-14 NOTE — Progress Notes (Signed)
PROGRESS NOTE  Peter [name] DEY:[id number] DOB: [birth year] DOA: 02/27/2018 PCP: Patient, No Pcp Per  Brief Summary:  24 AAM  H/o ESRD on HD x 3 years-prior on PD for 3 years, systolic chf ef 31-49% 2/2 CAD ans Stent x1  02/2016, htn hld Presented to the hospital 02/27/2018 with left sided weakness, found to have Acute stroke Later developed chest pain, found to have NSTEMI, s/p DES Left ICA stenosis, scheduled to have left ICA stent placement by [name] on 3/3   CIR vs snf placement after ICA stent Anemia, checking fobt Renal mass needs follow up  HPI/Recap of past 24 hours:  Awake pleasant alert no fever no chills No dark or tarry stool No cp No blurred nor double vision  Assessment/Plan: Principal Problem:   Cerebrovascular accident (CVA) ([place]) Active Problems:   Elevated troponin   Elevated LFTs   HTN (hypertension)   ESRD on dialysis ([place])   CAD (coronary artery disease)   Diabetes ([facility])   CHF (congestive heart failure) ([place])   Traumatic brain injury ([place])   Anemia of chronic disease   Right kidney mass   TIA (transient ischemic attack)   Chronic systolic CHF (congestive heart failure) (H[facility])   NSTEMI (non-ST elevated myocardial infarction) ([place])   Carotid stenosis   Status post coronary artery stent placement   FTT (failure to thrive) in adult    Acute Stroke:  Multiple infarcts - embolic - unknown source. (presenting symptom) -patient presented with left sided weakness after dialysis, He did not receive IV t-PA due to mild deficits --aspirin 325 mg daily prior to admission, then on aspirin 325 mg daily and plavix, then on heparin drip/asa 81/plavix, now s/p cardiac stent, on asa 81mg  /plavix daily --changed to [place] per IR-Neuro for anticpated--- cerebral stenting early next week (3/3) by [name],  -tele has been sinus rhythm since hospitalization -keep permissive hypertension until ICA stenting -needs per neuro TEE/ holter monitor/ loop record  er (pease refer to neurology note by Dr Rosalin Hawking on 2/24) -Patient reviewed by CIR-to higher level of function-probably home with home health status post procedure  NSTEMI (2/18-2/19): -had chest pain midnight (2/18-2/19), troponin peaked at 6.4  -s/p cardiac cath on 2/21 with PCI/DESx1 to the LAD with scoring balloon to ISR in the ostium of OM1. Continue to treat RCA disease medically. Plan for DAPT with ASA/plavix for at least one year   -cardiology recommend 30 day event monitor and TEE/possible ICD evaluation on outpatient basis through his primary cardiology [name] -Need to follow up with [name] in two week after discharge -No new changes today  Chronic systolic heart failure: ef 30-35% volume managed by dialysis F/u with primary cardiology   HTN:  On toprol-xl, cozaar, imdur, norvasc, getting dialysis  ESRD on HD MWF Anemia of chronic disease, hgb 7.9,  He is getting aranasp per nephrology.  prbc transfusion and Dialysis per nephrology hemoccult was + 2/25 In addition his hemoglobin has dropped to 7.5 We will follow again with a repeat and examine in am--if turns out to be + again, might need scope prior to procedure  Right renal mass. Incidental finding on ab [place] from 2/15 "Indeterminate small mass in the lower pole of the right kidney. This could reflect a complex cyst or solid lesion. Further evaluation recommended. Given the patient's renal insufficiency, noncontrast renal MRI is likely the best available imaging option." Not sure is he still make urine, will try to obtain ua Will need  to follow up as outpatient, daughter is made aware to follow up on right renal mass.   Chronic nasal congestion:  Continue flonase, add on claritin   FTT;  He reports just recovering from a prolonged hospitalization last year, started driving two weeks ago prior to this hospitalization CIR eval  Code Status: full Family Communication: patient only today--called daughter 2/28  and updated Disposition Plan: to have left ICA placement early next week, then CIR  Vs SNF pending PT eval and insurance auth   Consultants:  Cardiology/EP [name]  Neurology  nephrology  IR [name]  CIR  Procedures:  Dialysis  Cardiac cath on 2/21 with PCI/DESx1 to the LAD with scoring balloon to ISR in the ostium of OM1.  Cerebral angiogram on 2/25  Antibiotics:  none   Objective: BP (!) 164/58 (BP Location: Left Arm)   Pulse 66   Temp 98 F (36.7 C) (Oral)   Resp 18   Ht 6\' 3"  (1.905 m)   Wt 67.6 kg   SpO2 100%   BMI 18.62 kg/m   Intake/Output Summary (Last 24 hours) at 03/14/2018 0759 Last data filed at 03/13/2018 1857 Gross per 24 hour  Intake 480 ml  Output 2000 ml  Net -1520 ml   Filed Weights   03/13/18 0730 03/13/18 1116 03/14/18 3009  Weight: 69.2 kg 67 kg 67.6 kg    Exam:  Awake pleasant in nad Eating drinking ta b Some weakness  LLE > RLE but corresponds to his recent CVA   Data Reviewed: Basic Metabolic Panel: Recent Labs  Lab 03/08/18 0309 03/09/18 0740 03/11/18 0723 03/13/18 0646  NA 132* 133* 133* 138  K 4.3 4.1 4.0 3.8  CL 97* 97* 97* 100  CO2 21* 24 23 23   GLUCOSE 89 90 95 109*  BUN 54* 36* 40* 39*  CREATININE 13.17* 10.68* 10.24* 11.08*  CALCIUM 8.0* 8.4* 8.0* 8.3*  PHOS 5.0* 4.8* 4.9* 4.7*   Liver Function Tests: Recent Labs  Lab 03/08/18 0309 03/09/18 0740 03/11/18 0723 03/13/18 0646  ALBUMIN 2.5* 2.7* 2.7* 2.6*   No results for input(s): LIPASE, AMYLASE in the last 168 hours. No results for input(s): AMMONIA in the last 168 hours. CBC: Recent Labs  Lab 03/08/18 0308 03/09/18 0438 03/10/18 0321 03/11/18 0723 03/13/18 0646  WBC 9.1 8.7 8.2 9.7 9.2  NEUTROABS  --  3.5 3.1  --   --   HGB 7.9* 8.1* 8.0* 8.1* 7.5*  HCT 24.7* 25.3* 24.3* 25.3* 22.7*  MCV 89.2 88.8 89.3 90.0 89.0  PLT 170 204 204 236 244   Cardiac Enzymes:   No results for input(s): CKTOTAL, CKMB, CKMBINDEX, TROPONINI in the  last 168 hours. BNP (last 3 results) No results for input(s): BNP in the last 8760 hours.  ProBNP (last 3 results) No results for input(s): PROBNP in the last 8760 hours.  CBG: Recent Labs  Lab 03/11/18 1040  GLUCAP 73    Recent Results (from the past 240 hour(s))  MRSA PCR Screening     Status: None   Collection Time: 03/05/18 10:44 PM  Result Value Ref Range Status   MRSA by PCR NEGATIVE NEGATIVE Final    Comment:        The GeneXpert MRSA Assay (FDA approved for NASAL specimens only), is one component of a comprehensive MRSA colonization surveillance program. It is not intended to diagnose MRSA infection nor to guide or monitor treatment for MRSA infections. Performed at [facility], [place]  [address] [place], [place] [address]      Studies: No results found.  Scheduled Meds: . amLODipine  5 mg Oral QHS  . aspirin EC  81 mg Oral Daily  . atorvastatin  40 mg Oral q1800  . azelastine  1 spray Each Nare BID  . Chlorhexidine Gluconate Cloth  6 each Topical Q0600  . darbepoetin (ARANESP) injection - DIALYSIS  60 mcg Intravenous Q Fri-HD  . famotidine  20 mg Oral QHS  . fluticasone  2 spray Each Nare BID  . isosorbide mononitrate  60 mg Oral Daily  . loratadine  10 mg Oral Q48H  . losartan  100 mg Oral Daily  . metoprolol succinate  200 mg Oral Daily  . multivitamin  1 tablet Oral Daily  . polyethylene glycol  17 g Oral Daily  . senna-docusate  1 tablet Oral BID  . sodium chloride flush  3 mL Intravenous Q12H  . ticagrelor  90 mg Oral BID    Continuous Infusions: . sodium chloride       Time spent:25 mins, case discussed with  interventional radiology and CIR coordinator  I have personally reviewed and interpreted on  03/14/2018 daily labs, imagings as discussed above under date review session and assessment and plans.  I reviewed all nursing notes, pharmacy notes, consultant notes,  vitals, pertinent old records  I have discussed plan of care as  described above with RN , patient and daughter over the phone  on 03/14/2018   Verneita Griffes, MD [facility] 7:59 AM

## 2018-03-14 NOTE — Progress Notes (Signed)
PROGRESS NOTE  Peter [name] XTG:[id number] DOB: 15-Mar-1945 DOA: 02/27/2018 PCP: Patient, No Pcp Per  Brief Summary:  86 AAM  H/o ESRD on HD x 3 years-prior on PD for 3 years, systolic chf ef 27-03% 2/2 CAD ans Stent x1  02/2016, htn hld Presented to the hospital 02/27/2018 with left sided weakness, found to have Acute stroke Later developed chest pain, found to have NSTEMI, s/p DES Left ICA stenosis, scheduled to have left ICA stent placement by [name] on 3/3  CIR vs snf placement after ICA stent Anemia, checking fobt Renal mass needs follow up  HPI/Recap of past 24 hours:  No dark or tarry stool Has had some loose movements--might have had some dark stool about 2-3 week ago Eating drinking Walked with therapy today and did stairs-felt worn out after. No cp no fever no chills Weakness on  L side remains to some degree  Assessment/Plan: Principal Problem:   Cerebrovascular accident (CVA) ([place]) Active Problems:   Elevated troponin   Elevated LFTs   HTN (hypertension)   ESRD on dialysis ([place])   CAD (coronary artery disease)   Diabetes (HCC)   CHF (congestive heart failure) ([place])   Traumatic brain injury ([place])   Anemia of chronic disease   Right kidney mass   TIA (transient ischemic attack)   Chronic systolic CHF (congestive heart failure) (HCC)   NSTEMI (non-ST elevated myocardial infarction) ([place])   Carotid stenosis   Status post coronary artery stent placement   FTT (failure to thrive) in adult    Acute Stroke:  Multiple infarcts - embolic - unknown source. (presenting symptom) -patient presented with left sided weakness after dialysis, He did not receive IV t-PA due to mild deficits -s/p cardiac stent, on asa 81mg  /plavix daily--> Brillinta per IR-Neuro for anticipated cerebral stenting early next week (3/3) by [name],  -tele has been sinus rhythm since hospitalization--discontinued -keep permissive hypertension until ICA stenting -needs per neuro  TEE/ holter monitor/ loop (pease refer to neurology note by Dr Rosalin Hawking on 2/24)  NSTEMI (2/18-2/19): -had chest pain midnight (2/18-2/19), troponin peaked at 6.4  -s/p cardiac cath on 2/21 with PCI/DESx1 to the LAD with scoring balloon to ISR in the ostium of OM1. Continue to treat RCA disease medically.  -Needs DAPT with ASA/plavix for at least one year   -cardiology recommend 30 day event monitor and TEE/possible ICD evaluation on outpatient basis through his primary cardiology [name] -Need to follow up with [name] in two week after discharge -No new changes today  Chronic systolic heart failure: ef 30-35% volume managed by dialysis F/u with primary cardiology   HTN:  On toprol-xl 200 qd, cozaar 100 qd , imdur 60 qd , norvasc 5 qd , getting dialysis  ESRD on HD MWF Anemia of chronic disease, hgb 7.9,  He is getting aranasp per nephrology.  prbc transfusion and Dialysis per nephrology hemoccult was + 2/25 Hemoglobin up from 7.5-->9.3 without transfusion D/w Dr. Silverio Decamp of GI-given recent MI and stent, and only heme +, not gi hemorrhage would need coordination as OP for timing of colonoscopy Patient hasn't had colonoscopy in the past 2/2 being on blood thinner--[facility]  Right renal mass. Incidental finding on ab [place] from 2/15 "Indeterminate small mass in the lower pole of the right kidney. This could reflect a complex cyst or solid lesion. Further evaluation recommended. Given the patient's renal insufficiency, noncontrast renal MRI is likely the best available imaging option." Not sure  is he still make urine, will try to obtain ua Will need to follow up as outpatient, daughter is made aware to follow up on right renal mass.   Chronic nasal congestion:  Continue flonase, add on claritin Added Ipratropium with good benefit   FTT;  He reports just recovering from a prolonged hospitalization last year, started driving two weeks ago prior to this  hospitalization CIR eval  Code Status: full Family Communication: updated daughter on 2/29 Disposition Plan: to have left ICA placement early next week, then CIR  Vs SNF pending PT eval and insurance auth   Consultants:  Cardiology/EP [name]  Neurology  nephrology  IR [name]  CIR  Procedures:  Dialysis  Cardiac cath on 2/21 with PCI/DESx1 to the LAD with scoring balloon to ISR in the ostium of OM1.  Cerebral angiogram on 2/25  Antibiotics:  none   Objective: BP (!) 164/58 (BP Location: Left Arm)   Pulse 66   Temp 98 F (36.7 C) (Oral)   Resp 18   Ht 6\' 3"  (1.905 m)   Wt 67.6 kg   SpO2 100%   BMI 18.62 kg/m   Intake/Output Summary (Last 24 hours) at 03/14/2018 1303 Last data filed at 03/14/2018 0858 Gross per 24 hour  Intake 720 ml  Output -  Net 720 ml   Filed Weights   03/13/18 0730 03/13/18 1116 03/14/18 1610  Weight: 69.2 kg 67 kg 67.6 kg    Exam:  Seen walking in hallway slight unsteady No ict no pallor cta b s1 s2 no m/r/g Clear no added sound LLE weaker than RLE Neuro overall intact, psych euthymic   Data Reviewed: Basic Metabolic Panel: Recent Labs  Lab 03/08/18 0309 03/09/18 0740 03/11/18 0723 03/13/18 0646  NA 132* 133* 133* 138  K 4.3 4.1 4.0 3.8  CL 97* 97* 97* 100  CO2 21* 24 23 23   GLUCOSE 89 90 95 109*  BUN 54* 36* 40* 39*  CREATININE 13.17* 10.68* 10.24* 11.08*  CALCIUM 8.0* 8.4* 8.0* 8.3*  PHOS 5.0* 4.8* 4.9* 4.7*   Liver Function Tests: Recent Labs  Lab 03/08/18 0309 03/09/18 0740 03/11/18 0723 03/13/18 0646  ALBUMIN 2.5* 2.7* 2.7* 2.6*   No results for input(s): LIPASE, AMYLASE in the last 168 hours. No results for input(s): AMMONIA in the last 168 hours. CBC: Recent Labs  Lab 03/09/18 0438 03/10/18 0321 03/11/18 0723 03/13/18 0646 03/14/18 0827  WBC 8.7 8.2 9.7 9.2 9.1  NEUTROABS 3.5 3.1  --   --  4.4  HGB 8.1* 8.0* 8.1* 7.5* 9.3*  HCT 25.3* 24.3* 25.3* 22.7* 29.1*  MCV 88.8 89.3  90.0 89.0 89.5  PLT 204 204 236 244 281   Cardiac Enzymes:   No results for input(s): CKTOTAL, CKMB, CKMBINDEX, TROPONINI in the last 168 hours. BNP (last 3 results) No results for input(s): BNP in the last 8760 hours.  ProBNP (last 3 results) No results for input(s): PROBNP in the last 8760 hours.  CBG: Recent Labs  Lab 03/11/18 1040  GLUCAP 73    Recent Results (from the past 240 hour(s))  MRSA PCR Screening     Status: None   Collection Time: 03/05/18 10:44 PM  Result Value Ref Range Status   MRSA by PCR NEGATIVE NEGATIVE Final    Comment:        The GeneXpert MRSA Assay (FDA approved for NASAL specimens only), is one component of a comprehensive MRSA colonization surveillance program. It is not intended to  diagnose MRSA infection nor to guide or monitor treatment for MRSA infections. Performed at [facility], [place] [address] [place], [place] [address]      Studies: No results found.  Scheduled Meds: . amLODipine  5 mg Oral QHS  . aspirin EC  81 mg Oral Daily  . atorvastatin  40 mg Oral q1800  . azelastine  1 spray Each Nare BID  . Chlorhexidine Gluconate Cloth  6 each Topical Q0600  . darbepoetin (ARANESP) injection - DIALYSIS  60 mcg Intravenous Q Fri-HD  . famotidine  20 mg Oral QHS  . fluticasone  2 spray Each Nare BID  . isosorbide mononitrate  60 mg Oral Daily  . loratadine  10 mg Oral Q48H  . losartan  100 mg Oral Daily  . metoprolol succinate  200 mg Oral Daily  . multivitamin  1 tablet Oral Daily  . polyethylene glycol  17 g Oral Daily  . senna-docusate  1 tablet Oral BID  . sodium chloride flush  3 mL Intravenous Q12H  . ticagrelor  90 mg Oral BID    Continuous Infusions: . sodium chloride      15 min  Verneita Griffes, MD [facility] 1:03 PM

## 2018-03-14 NOTE — Progress Notes (Signed)
Pt complaints of running nose for 2 days, does not get any relief from flonase/ claritin. Paged MD to address the issue, no response

## 2018-03-14 NOTE — Progress Notes (Addendum)
[facility] Progress Note   Dialysis Orders: [place] (will need to call in am for current orders) 3.5h R IJ TDC Patient reported dry weight 155 lbs  Assessment/Plan: 1. Acute CVA- s/p cerebral arteriogram with 90% stenosis of prox LICA, 19% prox RICA, and 50% in distal RICA, severe stenosis of RVA at origin and 62% stenosis of LICA cavernous segment per Dr. Estanislado Pandy. 1. For LICA stent on 02/16/95 by Dr. Estanislado Pandy 2. NSTEMI- s/p PCI of LAD on 03/06/18 on asa/plavix - 3. ESRDcontinue with HD qMWF. He is actually below edw. Will need new lower edw at time of discharge. Next HD Monday  4. Anemia:on aranesp 60but also had ABLA due to GIB and transfuse prn hgb 7.5 pre HD Friday up to 9.3 today - has not been transfused  5. CKD-MBD:stable 6. Nutrition:renal diet 7. Hypertension/volume:net UF 2 L Friday with post wt 67 kg/147 Lb - avoid BP drops - would check orthostatics pre HD Monday 8. Disposition- will need to reassess after stent placement next week but getting stronger daily. - evaluating for CIR vs SNF  Myriam Jacobson, PA-C [facility] Beeper [phone] 03/14/2018,9:40 AM  LOS: 14 days   Subjective:   Concerned that weight may be too low.  Denies dizziness when up and about.  Objective Vitals:   03/13/18 1116 03/13/18 1418 03/13/18 2027 03/14/18 0632  BP: (!) 164/66 138/73 (!) 166/63 (!) 164/58  Pulse: 67 71 69 66  Resp: 18  16 18   Temp: 98.5 F (36.9 C) 98.2 F (36.8 C) 98.9 F (37.2 C) 98 F (36.7 C)  TempSrc: Oral Oral Oral Oral  SpO2: 98% 98% 100% 100%  Weight: 67 kg   67.6 kg  Height:       Physical Exam General: thin male sitting in chair. NAD breathing easily Heart: RRR no rub Lungs: no rales Abdomen:  Soft NT Extremities: no LE edema Dialysis Access:  Right IJ [facility]   Additional Objective Labs: Basic Metabolic Panel: Recent Labs  Lab 03/09/18 0740 03/11/18 0723 03/13/18 0646  NA 133* 133* 138  K  4.1 4.0 3.8  CL 97* 97* 100  CO2 24 23 23   GLUCOSE 90 95 109*  BUN 36* 40* 39*  CREATININE 10.68* 10.24* 11.08*  CALCIUM 8.4* 8.0* 8.3*  PHOS 4.8* 4.9* 4.7*   Liver Function Tests: Recent Labs  Lab 03/09/18 0740 03/11/18 0723 03/13/18 0646  ALBUMIN 2.7* 2.7* 2.6*   No results for input(s): LIPASE, AMYLASE in the last 168 hours. CBC: Recent Labs  Lab 03/09/18 0438 03/10/18 0321 03/11/18 0723 03/13/18 0646 03/14/18 0827  WBC 8.7 8.2 9.7 9.2 9.1  NEUTROABS 3.5 3.1  --   --  4.4  HGB 8.1* 8.0* 8.1* 7.5* 9.3*  HCT 25.3* 24.3* 25.3* 22.7* 29.1*  MCV 88.8 89.3 90.0 89.0 89.5  PLT 204 204 236 244 281   Blood Culture No results found for: SDES, SPECREQUEST, CULT, REPTSTATUS  Cardiac Enzymes: No results for input(s): CKTOTAL, CKMB, CKMBINDEX, TROPONINI in the last 168 hours. CBG: Recent Labs  Lab 03/11/18 1040  GLUCAP 73   Iron Studies: No results for input(s): IRON, TIBC, TRANSFERRIN, FERRITIN in the last 72 hours. Lab Results  Component Value Date   INR 1.1 03/09/2018   INR 1.08 02/27/2018   Studies/Results: No results found. Medications: . sodium chloride     . amLODipine  5 mg Oral QHS  . aspirin EC  81 mg Oral Daily  . atorvastatin  40 mg Oral q1800  . azelastine  1 spray Each Nare BID  . Chlorhexidine Gluconate Cloth  6 each Topical Q0600  . darbepoetin (ARANESP) injection - DIALYSIS  60 mcg Intravenous Q Fri-HD  . famotidine  20 mg Oral QHS  . fluticasone  2 spray Each Nare BID  . isosorbide mononitrate  60 mg Oral Daily  . loratadine  10 mg Oral Q48H  . losartan  100 mg Oral Daily  . metoprolol succinate  200 mg Oral Daily  . multivitamin  1 tablet Oral Daily  . polyethylene glycol  17 g Oral Daily  . senna-docusate  1 tablet Oral BID  . sodium chloride flush  3 mL Intravenous Q12H  . ticagrelor  90 mg Oral BID    I have seen and examined this patient and agree with plan and assessment in the above note with renal recommendations/intervention  highlighted.  Doing well. [place] John A Xiadani Damman,MD 03/14/2018 1:33 PM

## 2018-03-15 MED ORDER — CHLORHEXIDINE GLUCONATE CLOTH 2 % EX PADS
6.0000 | MEDICATED_PAD | Freq: Every day | CUTANEOUS | Status: DC
  Administered 2018-03-16 – 2018-03-17 (×2): 6 via TOPICAL

## 2018-03-15 NOTE — Progress Notes (Addendum)
[facility] Progress Note   Dialysis Orders: [place] 3.5h R IJ TDC Patient reported dry weight 155 lbs  Assessment/Plan: 1. Acute CVA- s/p cerebral arteriogram with 90% stenosis of prox LICA, 67% prox RICA, and 50% in distal RICA, severe stenosis of RVA at origin and 67% stenosis of LICA cavernous segment per Dr. Estanislado Pandy. 1. For LICA stent on 2/0/94 by Dr. Estanislado Pandy 2. NSTEMI- s/p PCI of LAD on 03/06/18 on asa/plavix - 3. ESRDcontinue with HD qMWF. He is actually below edw. Will need new lower edw at time of discharge. Next HD Monday  4. Anemia:on aranesp 60but also had ABLA due to GIB and transfuse prn hgb 7.5 pre HD Friday up to 9.3 today - has not been transfused  5. CKD-MBD:stable 6. Nutrition:renal diet 7. Hypertension/volume:net UF 2 L Friday with post wt 67 kg/147 Lb - avoid BP drops - will check orthostatics pre HD Monday 8. Disposition- will need to reassess after stent placement next week but getting stronger daily. - evaluating for CIR vs SNF  Myriam Jacobson, PA-C [place] [phone] 03/15/2018,11:09 AM  LOS: 15 days   Subjective:   No new issues- Has chronic intermittent myoclonicjerks/ tremor left forearm that gets better as the day progresses.  Objective Vitals:   03/14/18 2107 03/14/18 2130 03/15/18 0100 03/15/18 0519  BP: (!) 171/58  (!) 169/57 (!) 173/66  Pulse:  66  65  Resp:      Temp:  98.5 F (36.9 C)  98.2 F (36.8 C)  TempSrc:  Oral  Oral  SpO2:  100%  100%  Weight:    68 kg  Height:       Physical Exam General: thin male sitting in chair. NAD breathing easily Heart: RRR no rub Lungs: no rales Abdomen:  Soft NT Extremities: no LE edema Dialysis Access:  Right IJ [facility]   Additional Objective Labs: Basic Metabolic Panel: Recent Labs  Lab 03/09/18 0740 03/11/18 0723 03/13/18 0646  NA 133* 133* 138  K 4.1 4.0 3.8  CL 97* 97* 100  CO2 24 23 23   GLUCOSE 90 95 109*  BUN  36* 40* 39*  CREATININE 10.68* 10.24* 11.08*  CALCIUM 8.4* 8.0* 8.3*  PHOS 4.8* 4.9* 4.7*   Liver Function Tests: Recent Labs  Lab 03/09/18 0740 03/11/18 0723 03/13/18 0646  ALBUMIN 2.7* 2.7* 2.6*   No results for input(s): LIPASE, AMYLASE in the last 168 hours. CBC: Recent Labs  Lab 03/09/18 0438 03/10/18 0321 03/11/18 0723 03/13/18 0646 03/14/18 0827  WBC 8.7 8.2 9.7 9.2 9.1  NEUTROABS 3.5 3.1  --   --  4.4  HGB 8.1* 8.0* 8.1* 7.5* 9.3*  HCT 25.3* 24.3* 25.3* 22.7* 29.1*  MCV 88.8 89.3 90.0 89.0 89.5  PLT 204 204 236 244 281   Blood Culture No results found for: SDES, SPECREQUEST, CULT, REPTSTATUS  Cardiac Enzymes: No results for input(s): CKTOTAL, CKMB, CKMBINDEX, TROPONINI in the last 168 hours. CBG: Recent Labs  Lab 03/11/18 1040  GLUCAP 73   Iron Studies: No results for input(s): IRON, TIBC, TRANSFERRIN, FERRITIN in the last 72 hours. Lab Results  Component Value Date   INR 1.1 03/09/2018   INR 1.08 02/27/2018   Studies/Results: No results found. Medications: . sodium chloride     . amLODipine  5 mg Oral QHS  . aspirin EC  81 mg Oral Daily  . atorvastatin  40 mg Oral q1800  . azelastine  1 spray Each Nare BID  .  Chlorhexidine Gluconate Cloth  6 each Topical Q0600  . darbepoetin (ARANESP) injection - DIALYSIS  60 mcg Intravenous Q Fri-HD  . famotidine  20 mg Oral QHS  . fluticasone  2 spray Each Nare BID  . isosorbide mononitrate  60 mg Oral Daily  . loratadine  10 mg Oral Q48H  . losartan  100 mg Oral Daily  . metoprolol succinate  200 mg Oral Daily  . multivitamin  1 tablet Oral Daily  . polyethylene glycol  17 g Oral Daily  . senna-docusate  1 tablet Oral BID  . sodium chloride flush  3 mL Intravenous Q12H  . ticagrelor  90 mg Oral BID    I have seen and examined this patient and agree with plan and assessment in the above note with renal recommendations/intervention highlighted.  Feels much better. Plan for HD tomorrow and cerebral  angiogram and stent of LICA on Tuesday. Governor [place] Tyrhonda Georgiades,MD 03/15/2018 2:48 PM

## 2018-03-15 NOTE — Progress Notes (Signed)
PROGRESS NOTE  Peter [name] CHE:[id number] DOB: 07/28/1945 DOA: 02/27/2018 PCP: Patient, No Pcp Per  Brief Summary:  39 AAM  H/o ESRD on HD x 3 years-prior on PD for 3 years, systolic chf ef 53-61% 2/2 CAD ans Stent x1  02/2016, htn hld Presented to the hospital 02/27/2018 with left sided weakness after dialysis 2/14, found to have Acute stroke 2/19 chest pain, found to have NSTEMI, s/p DES Left ICA stenosis, scheduled to have left ICA stent placement by [name] on 3/3  CIR vs snf placement after ICA stent Anemia, checking fobt Renal mass needs follow up  HPI/Recap of past 24 hours:  remains well and feels stronger No new issues  Assessment/Plan: Principal Problem:   Cerebrovascular accident (CVA) ([place]) Active Problems:   Elevated troponin   Elevated LFTs   HTN (hypertension)   ESRD on dialysis ([place])   CAD (coronary artery disease)   Diabetes ([place])   CHF (congestive heart failure) ([place])   Traumatic brain injury ([place])   Anemia of chronic disease   Right kidney mass   TIA (transient ischemic attack)   Chronic systolic CHF (congestive heart failure) ([place])   NSTEMI (non-ST elevated myocardial infarction) ([place])   Carotid stenosis   Status post coronary artery stent placement   FTT (failure to thrive) in adult    Acute Stroke:  Multiple infarcts - embolic - unknown source. (presenting symptom)  2/14 CVA + l sided weak after dialysis, He did not receive IV t-PA due to mild deficits -s/p cardiac stent, on asa 81mg  /plavix daily--> Brillinta per IR-Neuro for anticipated cerebral stenting early next week (3/3) by [name],  -tele has been sinus rhythm since hospitalization--discontinued -keep permissive hypertension until ICA stenting -needs per neuro TEE/ holter monitor/ loop (pease refer to neurology note by Dr Rosalin Hawking on 2/24)  NSTEMI (2/18-2/19): -had chest pain midnight (2/18-2/19), troponin peaked at 6.4  -s/p cardiac cath on 2/21 with PCI/DESx1 to the  LAD with scoring balloon to ISR in the ostium of OM1. Continue to treat RCA disease medically.  -Needs DAPT with ASA/plavix for at least one year   -cardiology recommend 30 day event monitor and TEE/possible ICD evaluation on outpatient basis through his primary cardiology [name] attempted but aborted 2/18] -Need to follow up with [name] in two week after discharge -No new changes today  Chronic systolic heart failure: ef 30-35% volume managed by dialysis F/u with primary cardiology   HTN:  On toprol-xl 200 qd, cozaar 100 qd , imdur 60 qd , norvasc 5 qd , getting dialysis  ESRD on HD MWF Anemia of chronic disease, hgb 7.9,  He is getting aranasp per nephrology.  prbc transfusion and Dialysis per nephrology Will need a lower EDW as weight has declined since all this started Management 2/2 hyperparathyroidism per [facility]  Anemia chr disease hemoccult was + 2/25, then neg 2/29 Hemoglobin up from 7.5-->9.3 without transfusion Cont Aranesp 60 mcg q frday D/w Dr. Silverio Decamp of GI 2/28-given recent MI and stent, and only heme +, not gi hemorrhage would need coordination as OP for timing of colonoscopy Patient hasn't had colonoscopy in the past 2/2 being on blood thinner--[facility]  Right renal mass. Incidental finding on ab [place] from 2/15 "Indeterminate small mass in the lower pole of the right kidney. This could reflect a complex cyst or solid lesion. Further evaluation recommended. Given the patient's renal insufficiency, noncontrast renal MRI is likely the best available imaging option."  Not sure is he still make urine, will try to obtain ua Will need to follow up as outpatient, daughter is made aware to follow up on right renal mass.   Chronic nasal congestion:  Continue flonase, add on claritin Added Ipratropium with good benefit   FTT;  He reports just recovering from a prolonged hospitalization last year, started driving two weeks ago prior to this  hospitalization CIR eval  Code Status: full Family Communication: updated daughter on 2/29 Disposition Plan: to have left ICA placement early next week, then CIR  Vs SNF pending PT eval and insurance auth   Consultants:  Cardiology/EP [name]  Neurology  nephrology  IR [name]  CIR  Procedures:  Dialysis  Cardiac cath on 2/21 with PCI/DESx1 to the LAD with scoring balloon to ISR in the ostium of OM1.  Cerebral angiogram on 2/25  Antibiotics:  none   Objective: BP (!) 163/67 (BP Location: Left Arm)   Pulse 66   Temp 98.2 F (36.8 C) (Oral)   Resp 18   Ht 6\' 3"  (1.905 m)   Wt 68 kg   SpO2 100%   BMI 18.75 kg/m   Intake/Output Summary (Last 24 hours) at 03/15/2018 1542 Last data filed at 03/15/2018 1159 Gross per 24 hour  Intake 720 ml  Output -  Net 720 ml   Filed Weights   03/13/18 1116 03/14/18 0632 03/15/18 0519  Weight: 67 kg 67.6 kg 68 kg    Exam:   No new changes since 2/29  No ict no pallor cta b s1 s2 no m/r/g Clear no added sound LLE weaker than RLE Neuro overall intact, psych euthymic   Data Reviewed: Basic Metabolic Panel: Recent Labs  Lab 03/09/18 0740 03/11/18 0723 03/13/18 0646  NA 133* 133* 138  K 4.1 4.0 3.8  CL 97* 97* 100  CO2 24 23 23   GLUCOSE 90 95 109*  BUN 36* 40* 39*  CREATININE 10.68* 10.24* 11.08*  CALCIUM 8.4* 8.0* 8.3*  PHOS 4.8* 4.9* 4.7*   Liver Function Tests: Recent Labs  Lab 03/09/18 0740 03/11/18 0723 03/13/18 0646  ALBUMIN 2.7* 2.7* 2.6*   No results for input(s): LIPASE, AMYLASE in the last 168 hours. No results for input(s): AMMONIA in the last 168 hours. CBC: Recent Labs  Lab 03/09/18 0438 03/10/18 0321 03/11/18 0723 03/13/18 0646 03/14/18 0827  WBC 8.7 8.2 9.7 9.2 9.1  NEUTROABS 3.5 3.1  --   --  4.4  HGB 8.1* 8.0* 8.1* 7.5* 9.3*  HCT 25.3* 24.3* 25.3* 22.7* 29.1*  MCV 88.8 89.3 90.0 89.0 89.5  PLT 204 204 236 244 281   Cardiac Enzymes:   No results for input(s):  CKTOTAL, CKMB, CKMBINDEX, TROPONINI in the last 168 hours. BNP (last 3 results) No results for input(s): BNP in the last 8760 hours.  ProBNP (last 3 results) No results for input(s): PROBNP in the last 8760 hours.  CBG: Recent Labs  Lab 03/11/18 1040  GLUCAP 73    Recent Results (from the past 240 hour(s))  MRSA PCR Screening     Status: None   Collection Time: 03/05/18 10:44 PM  Result Value Ref Range Status   MRSA by PCR NEGATIVE NEGATIVE Final    Comment:        The GeneXpert MRSA Assay (FDA approved for NASAL specimens only), is one component of a comprehensive MRSA colonization surveillance program. It is not intended to diagnose MRSA infection nor to guide or monitor treatment for MRSA  infections. Performed at [facility], [place] [address] [place], [place] [address]      Studies: No results found.  Scheduled Meds: . amLODipine  5 mg Oral QHS  . aspirin EC  81 mg Oral Daily  . atorvastatin  40 mg Oral q1800  . azelastine  1 spray Each Nare BID  . Chlorhexidine Gluconate Cloth  6 each Topical Q0600  . darbepoetin (ARANESP) injection - DIALYSIS  60 mcg Intravenous Q Fri-HD  . famotidine  20 mg Oral QHS  . fluticasone  2 spray Each Nare BID  . isosorbide mononitrate  60 mg Oral Daily  . loratadine  10 mg Oral Q48H  . losartan  100 mg Oral Daily  . metoprolol succinate  200 mg Oral Daily  . multivitamin  1 tablet Oral Daily  . polyethylene glycol  17 g Oral Daily  . senna-docusate  1 tablet Oral BID  . sodium chloride flush  3 mL Intravenous Q12H  . ticagrelor  90 mg Oral BID    Continuous Infusions: . sodium chloride      15 min  Verneita Griffes, MD [facility] 3:42 PM

## 2018-03-16 ENCOUNTER — Inpatient Hospital Stay (HOSPITAL_COMMUNITY): Payer: Medicare HMO

## 2018-03-16 LAB — CBC
HCT: 22.9 % — ABNORMAL LOW (ref 39.0–52.0)
Hemoglobin: 7.4 g/dL — ABNORMAL LOW (ref 13.0–17.0)
MCH: 29.2 pg (ref 26.0–34.0)
MCHC: 32.3 g/dL (ref 30.0–36.0)
MCV: 90.5 fL (ref 80.0–100.0)
Platelets: 266 10*3/uL (ref 150–400)
RBC: 2.53 MIL/uL — ABNORMAL LOW (ref 4.22–5.81)
RDW: 15.5 % (ref 11.5–15.5)
WBC: 11.1 10*3/uL — ABNORMAL HIGH (ref 4.0–10.5)
nRBC: 0 % (ref 0.0–0.2)

## 2018-03-16 LAB — RENAL FUNCTION PANEL
Albumin: 2.6 g/dL — ABNORMAL LOW (ref 3.5–5.0)
Anion gap: 12 (ref 5–15)
BUN: 42 mg/dL — ABNORMAL HIGH (ref 8–23)
CO2: 24 mmol/L (ref 22–32)
Calcium: 8 mg/dL — ABNORMAL LOW (ref 8.9–10.3)
Chloride: 96 mmol/L — ABNORMAL LOW (ref 98–111)
Creatinine, Ser: 12.18 mg/dL — ABNORMAL HIGH (ref 0.61–1.24)
GFR calc Af Amer: 4 mL/min — ABNORMAL LOW (ref >60)
GFR calc non Af Amer: 4 mL/min — ABNORMAL LOW (ref >60)
Glucose, Bld: 140 mg/dL — ABNORMAL HIGH (ref 70–99)
Phosphorus: 4.8 mg/dL — ABNORMAL HIGH (ref 2.5–4.6)
Potassium: 3.8 mmol/L (ref 3.5–5.1)
Sodium: 132 mmol/L — ABNORMAL LOW (ref 135–145)

## 2018-03-16 LAB — PLATELET INHIBITION P2Y12
Platelet Function  P2Y12: 204 [PRU] (ref 182–335)
Platelet Function  P2Y12: 194 [PRU] (ref 182–335)

## 2018-03-16 MED ORDER — NIMODIPINE 30 MG PO CAPS
0.0000 mg | ORAL_CAPSULE | ORAL | Status: DC
  Filled 2018-03-16: qty 2

## 2018-03-16 MED ORDER — ALTEPLASE 2 MG IJ SOLR: 2.0000 mg | Freq: Once | INTRAMUSCULAR | Status: DC | PRN

## 2018-03-16 MED ORDER — TICAGRELOR 90 MG PO TABS
90.0000 mg | ORAL_TABLET | Freq: Once | ORAL | Status: AC
  Administered 2018-03-17: 90 mg via ORAL
  Filled 2018-03-16 (×2): qty 1

## 2018-03-16 MED ORDER — CEFAZOLIN SODIUM-DEXTROSE 2-4 GM/100ML-% IV SOLN: 2.0000 g | INTRAVENOUS | Status: DC

## 2018-03-16 MED ORDER — LIDOCAINE HCL (PF) 1 % IJ SOLN: 5.0000 mL | INTRAMUSCULAR | Status: DC | PRN

## 2018-03-16 MED ORDER — LIDOCAINE-PRILOCAINE 2.5-2.5 % EX CREA: 1.0000 "application " | TOPICAL_CREAM | CUTANEOUS | Status: DC | PRN

## 2018-03-16 MED ORDER — SODIUM CHLORIDE 0.9 % IV SOLN: 100.0000 mL | INTRAVENOUS | Status: DC | PRN

## 2018-03-16 MED ORDER — ASPIRIN EC 325 MG PO TBEC
325.0000 mg | DELAYED_RELEASE_TABLET | ORAL | Status: AC
  Administered 2018-03-17: 325 mg via ORAL
  Filled 2018-03-16 (×2): qty 1

## 2018-03-16 MED ORDER — HEPARIN SODIUM (PORCINE) 1000 UNIT/ML IJ SOLN
INTRAMUSCULAR | Status: AC
  Filled 2018-03-16: qty 4

## 2018-03-16 MED ORDER — PENTAFLUOROPROP-TETRAFLUOROETH EX AERO: 1.0000 "application " | INHALATION_SPRAY | CUTANEOUS | Status: DC | PRN

## 2018-03-16 MED ORDER — SODIUM CHLORIDE 0.9 % IV SOLN
INTRAVENOUS | Status: DC
  Administered 2018-03-17: 07:00:00 via INTRAVENOUS

## 2018-03-16 MED ORDER — SODIUM CHLORIDE 0.9 % IV SOLN
125.0000 mg | INTRAVENOUS | Status: AC
  Administered 2018-03-16 – 2018-03-23 (×4): 125 mg via INTRAVENOUS
  Filled 2018-03-16 (×9): qty 10

## 2018-03-16 NOTE — Progress Notes (Signed)
Physical Therapy Treatment Patient Details Name: Peter [name] MRN: [id number] DOB: 02-09-1945 Today's Date: 03/16/2018    History of Present Illness Pt is a 73 y.o. male admitted 02/27/18 with L-sided weakness while at dialysis. MRI shows small volume diffusion abnormality involving parasagittal cerebral hemispheres, additional L frontal cortical involvement compatible with acute ischemia; remote bilat cerebellar infarcts. Pt with 2x NSTEMIs since admission. S/p PCI of LAD on 2/21. 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 Tentative plan for L ICA stent on 03/17/18. PMH includes HTN, ESRD on HD.    PT Comments    Pt admitted with above diagnosis. Pt currently with functional limitations due to balance and endurance deficits. Pt was able to ambulate to steps and practiced steps with min guard assist for steps and supervision for ambulation.  Has progressed well.  Will check back Wed after PCI and ensure that pt is continuing to do well.   Pt will benefit from skilled PT to increase their independence and safety with mobility to allow discharge to the venue listed below.     Follow Up Recommendations  Home health PT;Supervision - Intermittent     Equipment Recommendations  None recommended by PT    Recommendations for Other Services       Precautions / Restrictions Precautions Precautions: Fall Restrictions Weight Bearing Restrictions: No    Mobility  Bed Mobility               General bed mobility comments: pt sitting EOB upon arrival  Transfers Overall transfer level: Needs assistance Equipment used: Rolling walker (2 wheeled) Transfers: Sit to/from Stand Sit to Stand: Supervision Stand pivot transfers: Supervision       General transfer comment: pt modif independent with RW in room  Ambulation/Gait Ambulation/Gait assistance: Supervision Gait Distance (Feet): 450  Feet Assistive device: Rolling walker (2 wheeled) Gait Pattern/deviations: Step-through pattern;Decreased stride length   Gait velocity interpretation: 1.31 - 2.62 ft/sec, indicative of limited community ambulator General Gait Details: Pt does well with RW without LOB and cqan withstand challenges to balance as well.     Stairs Stairs: Yes Stairs assistance: Supervision Stair Management: One rail Right;Step to pattern;Forwards Number of Stairs: 4 General stair comments: Did well with rail, pt ascends and descends slowly.     Wheelchair Mobility    Modified Rankin (Stroke Patients Only) Modified Rankin (Stroke Patients Only) Pre-Morbid Rankin Score: No symptoms Modified Rankin: Moderate disability     Balance Overall balance assessment: Needs assistance Sitting-balance support: No upper extremity supported;Feet supported Sitting balance-Leahy Scale: Good     Standing balance support: During functional activity;No upper extremity supported Standing balance-Leahy Scale: Fair Standing balance comment: pt is able to static stand without UE support                             Cognition Arousal/Alertness: Awake/alert Behavior During Therapy: WFL for tasks assessed/performed Overall Cognitive Status: Within Functional Limits for tasks assessed Area of Impairment: Problem solving                   Current Attention Level: Selective Memory: Decreased short-term memory Following Commands: Follows multi-step commands with increased time Safety/Judgement: Decreased awareness of safety Awareness: Emergent Problem Solving: Slow processing;Requires verbal cues General Comments: No visual difficulty at this time.      Exercises      General Comments  Pertinent Vitals/Pain Pain Assessment: No/denies pain    Home Living                      Prior Function            PT Goals (current goals can now be found in the care plan section)  Acute Rehab PT Goals Patient Stated Goal: Return to normal Progress towards PT goals: Progressing toward goals    Frequency    Min 4X/week      PT Plan Current plan remains appropriate    Co-evaluation              AM-PAC PT "6 Clicks" Mobility   Outcome Measure  Help needed turning from your back to your side while in a flat bed without using bedrails?: None Help needed moving from lying on your back to sitting on the side of a flat bed without using bedrails?: None Help needed moving to and from a bed to a chair (including a wheelchair)?: None Help needed standing up from a chair using your arms (e.g., wheelchair or bedside chair)?: None Help needed to walk in hospital room?: None Help needed climbing 3-5 steps with a railing? : A Little 6 Click Score: 23    End of Session Equipment Utilized During Treatment: Gait belt Activity Tolerance: Patient tolerated treatment well Patient left: in chair;with call bell/phone within reach Nurse Communication: Mobility status PT Visit Diagnosis: Other abnormalities of gait and mobility (R26.89);Muscle weakness (generalized) (M62.81)     Time: 3710-6269 PT Time Calculation (min) (ACUTE ONLY): 16 min  Charges:  $Gait Training: 8-22 mins                     [place] Pager:  [phone]  Office:  [place] 03/16/2018, 4:36 PM

## 2018-03-16 NOTE — Progress Notes (Addendum)
[facility] Progress Note    Dialysis Orders: [place] 3.5h R IJ TDC Patient reported dry weight 155 lbs  Assessment/Plan: 1. Acute CVA- s/p cerebral arteriogram with 90% stenosis of prox LICA, 07% prox RICA, and 50% in distal RICA, severe stenosis of RVA at origin and 12% stenosis of LICA cavernous segment per Dr. Estanislado Pandy. 1. For LICA stent on 01/22/73 by Dr. Estanislado Pandy 2. NSTEMI- s/p PCI of LAD on 03/06/18 on asa/ticagrelor 3. ESRDcontinue with HD qMWF. He is actually below edw. Will need new lower edw at time of discharge.  K 3.8 - 4 K today 4. Anemia:on aranesp 60but also had ABLA due to GIB and transfuse prn (has not been transfused yet) hgb 7.5 pre HD Friday up to 9.3 Saturday post then then 7.4 today - plan repeat Tuesday tsat 22% 2/24 - start short course of Fe  5. CKD-MBD:stable 6. Nutrition:renal diet 7. Hypertension/volume:net UF 2 L Friday with post wt 67 kg/147 Lb - BP up - pre wt 70.4 -UF goal 3.5 - permissive HTN until stenting 8. Disposition-will need to reassess after stent placement but getting stronger daily.- evaluating for CIR vs SNF  Myriam Jacobson, PA-C [place] [phone] 03/16/2018,8:44 AM  LOS: 16 days   Pt seen, examined and agree w A/P as above.  [facility] 03/16/2018, 12:35 PM    Subjective:   No c/o  Objective Vitals:   03/16/18 0725 03/16/18 0730 03/16/18 0800 03/16/18 0830  BP: (!) 167/71 (!) 170/71 (!) 175/65 (!) 174/64  Pulse: 66 65 63 65  Resp:      Temp:      TempSrc:      SpO2:      Weight:      Height:       Physical Exam General: NAD on HD  Heart: RRR Lungs: no rales Abdomen: soft NT ND Extremities: no LE edema  Dialysis Access:  Right IJ [facility]    Additional Objective Labs: Basic Metabolic Panel: Recent Labs  Lab 03/11/18 0723 03/13/18 0646 03/16/18 0729  NA 133* 138 132*  K 4.0 3.8 3.8  CL 97* 100 96*  CO2 23 23 24   GLUCOSE  95 109* 140*  BUN 40* 39* 42*  CREATININE 10.24* 11.08* 12.18*  CALCIUM 8.0* 8.3* 8.0*  PHOS 4.9* 4.7* 4.8*   Liver Function Tests: Recent Labs  Lab 03/11/18 0723 03/13/18 0646 03/16/18 0729  ALBUMIN 2.7* 2.6* 2.6*   No results for input(s): LIPASE, AMYLASE in the last 168 hours. CBC: Recent Labs  Lab 03/10/18 0321 03/11/18 0723 03/13/18 0646 03/14/18 0827 03/16/18 0729  WBC 8.2 9.7 9.2 9.1 11.1*  NEUTROABS 3.1  --   --  4.4  --   HGB 8.0* 8.1* 7.5* 9.3* 7.4*  HCT 24.3* 25.3* 22.7* 29.1* 22.9*  MCV 89.3 90.0 89.0 89.5 90.5  PLT 204 236 244 281 266   Blood Culture No results found for: SDES, SPECREQUEST, CULT, REPTSTATUS  Cardiac Enzymes: No results for input(s): CKTOTAL, CKMB, CKMBINDEX, TROPONINI in the last 168 hours. CBG: Recent Labs  Lab 03/11/18 1040  GLUCAP 73   Iron Studies: No results for input(s): IRON, TIBC, TRANSFERRIN, FERRITIN in the last 72 hours. Lab Results  Component Value Date   INR 1.1 03/09/2018   INR 1.08 02/27/2018   Studies/Results: No results found. Medications: . sodium chloride    . sodium chloride    . [START ON 03/17/2018] sodium chloride    . [  START ON 03/17/2018]  ceFAZolin (ANCEF) IV     . amLODipine  5 mg Oral QHS  . [START ON 03/17/2018] aspirin EC  325 mg Oral 60 min Pre-Op  . aspirin EC  81 mg Oral Daily  . atorvastatin  40 mg Oral q1800  . Chlorhexidine Gluconate Cloth  6 each Topical Q0600  . darbepoetin (ARANESP) injection - DIALYSIS  60 mcg Intravenous Q Fri-HD  . famotidine  20 mg Oral QHS  . fluticasone  2 spray Each Nare BID  . isosorbide mononitrate  60 mg Oral Daily  . loratadine  10 mg Oral Q48H  . losartan  100 mg Oral Daily  . metoprolol succinate  200 mg Oral Daily  . multivitamin  1 tablet Oral Daily  . [START ON 03/17/2018] niMODipine  0-60 mg Oral 60 min Pre-Op  . polyethylene glycol  17 g Oral Daily  . senna-docusate  1 tablet Oral BID  . ticagrelor  90 mg Oral BID  . [START ON 03/17/2018] ticagrelor  90  mg Oral Once

## 2018-03-16 NOTE — Progress Notes (Signed)
PT Cancellation Note  Patient Details Name: Peter [name] MRN: [id number] DOB: Feb 15, 1945   Cancelled Treatment:    Reason Eval/Treat Not Completed: Patient at procedure or test/unavailable(Pt in HD.  Will check back as able.  )   [name] 03/16/2018, 9:13 AM [place] Pager:  [phone]  Office:  [phone]

## 2018-03-16 NOTE — Progress Notes (Signed)
OT Cancellation Note  Patient Details Name: Peter [name] MRN: [id number] DOB: Jul 23, 1945   Cancelled Treatment:    Reason Eval/Treat Not Completed: Patient at procedure or test/ unavailable(HD. Will return as schedule allows. Thank you.)  [place], OTR/L Acute Rehab Pager: [phone] Office: [phone] 03/16/2018, 7:37 AM

## 2018-03-16 NOTE — Progress Notes (Signed)
PROGRESS NOTE  Peter [name] LXB:[id number] DOB: 01-Apr-1945 DOA: 02/27/2018 PCP: Patient, No Pcp Per  Brief Summary:  42 AAM  H/o ESRD on HD x 3 years-prior on PD for 3 years, systolic chf ef 41-63% 2/2 CAD ans Stent x1  02/2016, htn hld Presented to the hospital 02/27/2018 with left sided weakness after dialysis 2/14, found to have Acute stroke 2/19 chest pain, found to have NSTEMI, s/p DES Left ICA stenosis, scheduled to have left ICA stent placement by [name] on 3/3  CIR vs snf placement after ICA stent Anemia, checking fobt Renal mass needs follow up  HPI/Recap of past 24 hours:  No new spont complaints  Assessment/Plan: Principal Problem:   Cerebrovascular accident (CVA) ([place]) Active Problems:   Elevated troponin   Elevated LFTs   HTN (hypertension)   ESRD on dialysis ([place])   CAD (coronary artery disease)   Diabetes ([place])   CHF (congestive heart failure) ([place])   Traumatic brain injury ([place])   Anemia of chronic disease   Right kidney mass   TIA (transient ischemic attack)   Chronic systolic CHF (congestive heart failure) ([facility])   NSTEMI (non-ST elevated myocardial infarction) ([place])   Carotid stenosis   Status post coronary artery stent placement   FTT (failure to thrive) in adult    Acute Stroke:  Multiple infarcts - embolic - unknown source. (presenting symptom)  2/14 CVA + l sided weak after dialysis, He did not receive IV t-PA due to mild deficits -s/p cardiac stent, on asa 81mg  /plavix daily--> Brillinta per IR-Neuro for cerebral stenting early  (3/3) by [name],  -tele has been sinus rhythm since hospitalization--discontinued -keep permissive hypertension until ICA stenting -needs per neuro TEE/ holter monitor/ loop (pease refer to neurology note by Dr Rosalin Hawking on 2/24)  NSTEMI (2/18-2/19): -had chest pain midnight (2/18-2/19), troponin peaked at 6.4  -s/p cardiac cath on 2/21 with PCI/DESx1 to the LAD with scoring balloon to ISR in the ostium  of OM1. Continue to treat RCA disease medically.  -Needs DAPT with ASA/plavix for at least one year once done with brillinta as per IR   -cardiology recommend 30 day event monitor and TEE/possible ICD evaluation on outpatient basis through his primary cardiology [name] attempted but aborted 2/18] -Need to follow up with [name] in two week after discharge -No new changes today  Chronic systolic heart failure: ef 30-35% volume managed by dialysis F/u with primary cardiology   HTN:  On toprol-xl 200 qd, cozaar 100 qd , imdur 60 qd , norvasc 5 qd , getting dialysis  ESRD on HD MWF Anemia of chronic disease, hgb 7.9,  He is getting aranasp per nephrology.  prbc transfusion and Dialysis per nephrology Will need a lower EDW as weight has declined since all this started Management 2/2 hyperparathyroidism per [place]  Anemia chr disease hemoccult was + 2/25, then neg 2/29 Hemoglobin up from 7.5-->9.3 without transfusion Cont Aranesp 60 mcg q frday D/w Dr. Silverio Decamp of GI 2/28-given recent MI and stent, and only heme +, not gi hemorrhage would need coordination as OP for timing of colonoscopy Patient hasn't had colonoscopy in the past 2/2 being on blood thinner--[facility]  Right renal mass. Incidental finding on ab [place] from 2/15 "Indeterminate small mass in the lower pole of the right kidney. This could reflect a complex cyst or solid lesion. Further evaluation recommended. Given the patient's renal insufficiency, noncontrast renal MRI is likely the best available imaging  option." Not sure is he still make urine, will try to obtain ua Will need to follow up as outpatient, daughter is made aware to follow up on right renal mass.   Chronic nasal congestion:  Continue flonase, add on claritin Added Ipratropium with good benefit   FTT;  He reports just recovering from a prolonged hospitalization last year, started driving two weeks ago prior to this hospitalization CIR  eval  Code Status: full Family Communication: updated daughter on 2/29 Disposition Plan: to have left ICA placement early next week, then CIR  Vs SNF pending PT eval and insurance auth   Consultants:  Cardiology/EP [name]  Neurology  nephrology  IR [name]  CIR  Procedures:  Dialysis  Cardiac cath on 2/21 with PCI/DESx1 to the LAD with scoring balloon to ISR in the ostium of OM1.  Cerebral angiogram on 2/25  Antibiotics:  none   Objective: BP (!) 160/63 (BP Location: Right Arm)   Pulse 68   Temp 98.1 F (36.7 C) (Oral)   Resp 16   Ht 6\' 3"  (1.905 m)   Wt 67.6 kg   SpO2 93%   BMI 18.63 kg/m   Intake/Output Summary (Last 24 hours) at 03/16/2018 1246 Last data filed at 03/16/2018 1104 Gross per 24 hour  Intake 720 ml  Output 2523 ml  Net -1803 ml   Filed Weights   03/16/18 0530 03/16/18 0718 03/16/18 1104  Weight: 69.5 kg 70.3 kg 67.6 kg    Exam:   unchanged since 2/29--seated in chair  No ict no pallor cta b s1 s2 no m/r/g Clear no added sound LLE weaker than RLE Neuro overall intact, psych euthymic   Data Reviewed: Basic Metabolic Panel: Recent Labs  Lab 03/11/18 0723 03/13/18 0646 03/16/18 0729  NA 133* 138 132*  K 4.0 3.8 3.8  CL 97* 100 96*  CO2 23 23 24   GLUCOSE 95 109* 140*  BUN 40* 39* 42*  CREATININE 10.24* 11.08* 12.18*  CALCIUM 8.0* 8.3* 8.0*  PHOS 4.9* 4.7* 4.8*   Liver Function Tests: Recent Labs  Lab 03/11/18 0723 03/13/18 0646 03/16/18 0729  ALBUMIN 2.7* 2.6* 2.6*   No results for input(s): LIPASE, AMYLASE in the last 168 hours. No results for input(s): AMMONIA in the last 168 hours. CBC: Recent Labs  Lab 03/10/18 0321 03/11/18 0723 03/13/18 0646 03/14/18 0827 03/16/18 0729  WBC 8.2 9.7 9.2 9.1 11.1*  NEUTROABS 3.1  --   --  4.4  --   HGB 8.0* 8.1* 7.5* 9.3* 7.4*  HCT 24.3* 25.3* 22.7* 29.1* 22.9*  MCV 89.3 90.0 89.0 89.5 90.5  PLT 204 236 244 281 266   Cardiac Enzymes:   No results for  input(s): CKTOTAL, CKMB, CKMBINDEX, TROPONINI in the last 168 hours. BNP (last 3 results) No results for input(s): BNP in the last 8760 hours.  ProBNP (last 3 results) No results for input(s): PROBNP in the last 8760 hours.  CBG: Recent Labs  Lab 03/11/18 1040  GLUCAP 73    No results found for this or any previous visit (from the past 240 hour(s)).   Studies: No results found.  Scheduled Meds: . amLODipine  5 mg Oral QHS  . [START ON 03/17/2018] aspirin EC  325 mg Oral 60 min Pre-Op  . aspirin EC  81 mg Oral Daily  . atorvastatin  40 mg Oral q1800  . Chlorhexidine Gluconate Cloth  6 each Topical Q0600  . darbepoetin (ARANESP) injection - DIALYSIS  60 mcg  Intravenous Q Fri-HD  . famotidine  20 mg Oral QHS  . fluticasone  2 spray Each Nare BID  . heparin      . isosorbide mononitrate  60 mg Oral Daily  . loratadine  10 mg Oral Q48H  . losartan  100 mg Oral Daily  . metoprolol succinate  200 mg Oral Daily  . multivitamin  1 tablet Oral Daily  . [START ON 03/17/2018] niMODipine  0-60 mg Oral 60 min Pre-Op  . polyethylene glycol  17 g Oral Daily  . senna-docusate  1 tablet Oral BID  . ticagrelor  90 mg Oral BID  . [START ON 03/17/2018] ticagrelor  90 mg Oral Once    Continuous Infusions: . [START ON 03/17/2018] sodium chloride    . [START ON 03/17/2018]  ceFAZolin (ANCEF) IV    . ferric gluconate (FERRLECIT/NULECIT) IV Stopped (03/16/18 1241)    15 min  Verneita Griffes, MD [facility] 12:46 PM

## 2018-03-16 NOTE — Progress Notes (Signed)
Occupational Therapy Treatment Patient Details Name: Peter [name] MRN: [id number] DOB: 31-Oct-1945 Today's Date: 03/16/2018    History of present illness Pt is a 72 y.o. male admitted 02/27/18 with L-sided weakness while at dialysis. MRI shows small volume diffusion abnormality involving parasagittal cerebral hemispheres, additional L frontal cortical involvement compatible with acute ischemia; remote bilat cerebellar infarcts. Pt with 2x NSTEMIs since admission. S/p PCI of LAD on 2/21. 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 Tentative plan for L ICA stent on 03/17/18. PMH includes HTN, ESRD on HD.   OT comments  Pt progressing towards established OT goals. Providing pt with education on safe shower and tub transfers. Pt demonstrating understanding with Min guard A. Pt with increased fatigue today with HD and PT earlier today. Continue to recommend dc to home with HHOT and will continue to follow acutely as admitted.    Follow Up Recommendations  Home health OT;Supervision - Intermittent    Equipment Recommendations  None recommended by OT    Recommendations for Other Services      Precautions / Restrictions Precautions Precautions: Fall Restrictions Weight Bearing Restrictions: No       Mobility Bed Mobility               General bed mobility comments: Sitting in chair upon arrival  Transfers Overall transfer level: Needs assistance Equipment used: Rolling walker (2 wheeled) Transfers: Sit to/from Stand Sit to Stand: Supervision Stand pivot transfers: Supervision       General transfer comment: Supervision for safety    Balance Overall balance assessment: Needs assistance Sitting-balance support: No upper extremity supported;Feet supported Sitting balance-Leahy Scale: Good     Standing balance support: During functional activity;No upper extremity supported Standing  balance-Leahy Scale: Fair Standing balance comment: pt is able to static stand without UE support                            ADL either performed or assessed with clinical judgement   ADL Overall ADL's : Needs assistance/impaired Eating/Feeding: Supervision/ safety;Sitting Eating/Feeding Details (indicate cue type and reason): Managing cup and drink to prepare for self                             Tub/ Shower Transfer: Tub transfer;Walk-in shower;Min guard;Ambulation;Shower Technical sales engineer Details (indicate cue type and reason): Educating pt on how to perform shower and tub transfers with and without RW. Pt demosntrating understanding with Min Guard A for safety.  Functional mobility during ADLs: Supervision/safety;Rolling walker General ADL Comments: Pt motivated despite fatigue     Vision       Perception     Praxis      Cognition Arousal/Alertness: Awake/alert Behavior During Therapy: WFL for tasks assessed/performed Overall Cognitive Status: Within Functional Limits for tasks assessed Area of Impairment: Problem solving                   Current Attention Level: Selective Memory: Decreased short-term memory Following Commands: Follows multi-step commands with increased time Safety/Judgement: Decreased awareness of safety Awareness: Emergent Problem Solving: Slow processing;Requires verbal cues General Comments: Requiring increased time but following cues and demonstrating [facility] problem solving        Exercises     Shoulder Instructions       General Comments      Pertinent  Vitals/ Pain       Pain Assessment: No/denies pain  Home Living                                          Prior Functioning/Environment              Frequency  Min 2X/week        Progress Toward Goals  OT Goals(current goals can now be found in the care plan section)  Progress towards OT goals: Progressing  toward goals  Acute Rehab OT Goals Patient Stated Goal: Return to normal OT Goal Formulation: With patient/family Time For Goal Achievement: 03/14/18 Potential to Achieve Goals: Good ADL Goals Pt Will Perform Grooming: with supervision;standing Pt Will Perform Upper Body Dressing: with supervision;sitting Pt Will Perform Lower Body Dressing: with supervision;sit to/from stand Pt Will Transfer to Toilet: with supervision;regular height toilet;ambulating Pt Will Perform Toileting - Clothing Manipulation and hygiene: with supervision;sit to/from stand Additional ADL Goal #1: Pt will locate 4/5 ADL items in left visual field with Min cues Additional ADL Goal #2: Pt will incorporate LUE into ADLs 75% of time with 2-3 cues  Plan Discharge plan needs to be updated    Co-evaluation                 AM-PAC OT "6 Clicks" Daily Activity     Outcome Measure   Help from another person eating meals?: A Little Help from another person taking care of personal grooming?: A Little Help from another person toileting, which includes using toliet, bedpan, or urinal?: A Little Help from another person bathing (including washing, rinsing, drying)?: A Little Help from another person to put on and taking off regular upper body clothing?: A Little Help from another person to put on and taking off regular lower body clothing?: A Little 6 Click Score: 18    End of Session Equipment Utilized During Treatment: Rolling walker  OT Visit Diagnosis: Unsteadiness on feet (R26.81);Muscle weakness (generalized) (M62.81) Hemiplegia - Right/Left: Left Hemiplegia - dominant/non-dominant: Non-Dominant Hemiplegia - caused by: Cerebral infarction Pain - part of body: (HA)   Activity Tolerance Patient tolerated treatment well   Patient Left in chair;with call bell/phone within reach   Nurse Communication Mobility status        Time: 1700-1749 OT Time Calculation (min): 14 min  Charges: OT General  Charges $OT Visit: 1 Visit OT Treatments $Self Care/Home Management : 8-22 mins  [place], OTR/L Acute Rehab Pager: [phone] Office: [place] 03/16/2018, 5:26 PM

## 2018-03-17 ENCOUNTER — Other Ambulatory Visit (HOSPITAL_COMMUNITY): Payer: Medicare HMO

## 2018-03-17 ENCOUNTER — Other Ambulatory Visit (HOSPITAL_COMMUNITY): Payer: Self-pay | Admitting: *Deleted

## 2018-03-17 ENCOUNTER — Other Ambulatory Visit (HOSPITAL_COMMUNITY): Payer: Self-pay | Admitting: Radiology

## 2018-03-17 ENCOUNTER — Other Ambulatory Visit: Payer: Self-pay | Admitting: Student

## 2018-03-17 LAB — CBC WITH DIFFERENTIAL/PLATELET
Abs Immature Granulocytes: 0.04 10*3/uL (ref 0.00–0.07)
Basophils Absolute: 0.1 10*3/uL (ref 0.0–0.1)
Basophils Relative: 1 %
Eosinophils Absolute: 1 10*3/uL — ABNORMAL HIGH (ref 0.0–0.5)
Eosinophils Relative: 10 %
HCT: 25.6 % — ABNORMAL LOW (ref 39.0–52.0)
Hemoglobin: 8.3 g/dL — ABNORMAL LOW (ref 13.0–17.0)
Immature Granulocytes: 0 %
LYMPHS PCT: 22 %
Lymphs Abs: 2.3 10*3/uL (ref 0.7–4.0)
MCH: 29.5 pg (ref 26.0–34.0)
MCHC: 32.4 g/dL (ref 30.0–36.0)
MCV: 91.1 fL (ref 80.0–100.0)
Monocytes Absolute: 1.1 10*3/uL — ABNORMAL HIGH (ref 0.1–1.0)
Monocytes Relative: 10 %
Neutro Abs: 6 10*3/uL (ref 1.7–7.7)
Neutrophils Relative %: 57 %
Platelets: 301 10*3/uL (ref 150–400)
RBC: 2.81 MIL/uL — ABNORMAL LOW (ref 4.22–5.81)
RDW: 15.9 % — ABNORMAL HIGH (ref 11.5–15.5)
WBC: 10.5 10*3/uL (ref 4.0–10.5)
nRBC: 0 % (ref 0.0–0.2)

## 2018-03-17 LAB — BASIC METABOLIC PANEL
Anion gap: 13 (ref 5–15)
BUN: 24 mg/dL — ABNORMAL HIGH (ref 8–23)
CO2: 25 mmol/L (ref 22–32)
Calcium: 8.2 mg/dL — ABNORMAL LOW (ref 8.9–10.3)
Chloride: 97 mmol/L — ABNORMAL LOW (ref 98–111)
Creatinine, Ser: 7.83 mg/dL — ABNORMAL HIGH (ref 0.61–1.24)
GFR calc Af Amer: 7 mL/min — ABNORMAL LOW (ref >60)
GFR calc non Af Amer: 6 mL/min — ABNORMAL LOW (ref >60)
Glucose, Bld: 114 mg/dL — ABNORMAL HIGH (ref 70–99)
Potassium: 4.5 mmol/L (ref 3.5–5.1)
Sodium: 135 mmol/L (ref 135–145)

## 2018-03-17 LAB — PROTIME-INR
INR: 1.2 (ref 0.8–1.2)
Prothrombin Time: 14.7 seconds (ref 11.4–15.2)

## 2018-03-17 LAB — APTT: aPTT: 43 seconds — ABNORMAL HIGH (ref 24–36)

## 2018-03-17 LAB — PLATELET INHIBITION P2Y12: Platelet Function  P2Y12: 241 [PRU] (ref 182–335)

## 2018-03-17 MED ORDER — CHLORHEXIDINE GLUCONATE CLOTH 2 % EX PADS
6.0000 | MEDICATED_PAD | Freq: Every day | CUTANEOUS | Status: DC
  Administered 2018-03-20 – 2018-03-29 (×6): 6 via TOPICAL

## 2018-03-17 MED ORDER — DARBEPOETIN ALFA 100 MCG/0.5ML IJ SOSY
100.0000 ug | PREFILLED_SYRINGE | INTRAMUSCULAR | Status: DC
  Administered 2018-03-21 – 2018-03-27 (×2): 100 ug via INTRAVENOUS
  Filled 2018-03-17 (×3): qty 0.5

## 2018-03-17 MED ORDER — DIPHENHYDRAMINE HCL 25 MG PO CAPS
25.0000 mg | ORAL_CAPSULE | Freq: Every evening | ORAL | Status: DC | PRN
  Administered 2018-03-18 – 2018-03-23 (×4): 25 mg via ORAL
  Filled 2018-03-17 (×4): qty 1

## 2018-03-17 NOTE — Progress Notes (Signed)
Surgery cancelled today per [name].

## 2018-03-17 NOTE — Progress Notes (Signed)
Saw patient on floor with Dr. Estanislado Pandy.  Informed patient that his procedure has been rescheduled for Friday 03/20/2018 at 0800 with Dr. Estanislado Pandy. Informed patient that he will be NPO at midnight prior to procedure. New orders to be placed for procedure. Called patient's daughter [name]) at 35 to update her on changes. Dr. Verlon Au and floor RN aware.  Please call IR with questions/concerns.  Bea Graff [place], PA-C 03/17/2018, 12:38 PM

## 2018-03-17 NOTE — Progress Notes (Signed)
Physical Therapy Treatment Patient Details Name: Peter [name] MRN: [id number] DOB: 11-Aug-1945 Today's Date: 03/17/2018    History of Present Illness Pt is a 73 y.o. male admitted 02/27/18 with L-sided weakness while at dialysis. MRI shows small volume diffusion abnormality involving parasagittal cerebral hemispheres, additional L frontal cortical involvement compatible with acute ischemia; remote bilat cerebellar infarcts. Pt with 2x NSTEMIs since admission. S/p PCI of LAD on 2/21. 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 Tentative plan for L ICA stent on 03/17/18. PMH includes HTN, ESRD on HD.    PT Comments    Pt worked on ambulation with and without RW today, unsafe without any assist but did well with unilateral assist, no LOB. Guided pt through standing balance exercises with single limb UE support. Will continue to follow as pt awaits PCI on Friday.  SpO2 97%, HR 100 bpm, DOE 2/4 after ambulation.    Follow Up Recommendations  Home health PT;Supervision - Intermittent     Equipment Recommendations  None recommended by PT    Recommendations for Other Services       Precautions / Restrictions Precautions Precautions: Fall Precaution Comments: Inattention to left side (body and environment) Restrictions Weight Bearing Restrictions: No    Mobility  Bed Mobility Overal bed mobility: Independent                Transfers Overall transfer level: Modified independent Equipment used: Rolling walker (2 wheeled) Transfers: Sit to/from Stand Sit to Stand: Modified independent (Device/Increase time)         General transfer comment: mod I, no LOB  Ambulation/Gait Ambulation/Gait assistance: Supervision Gait Distance (Feet): 500 Feet Assistive device: Rolling walker (2 wheeled);1 person hand held assist Gait Pattern/deviations: Step-through pattern;Decreased stride length    Gait velocity interpretation: 1.31 - 2.62 ft/sec, indicative of limited community ambulator General Gait Details: used RW for 400', last 100' without AD. Unsteady with no AD, steady with HHA. May be able to progress back to his cane for short distances. Occasional R hand tremor noted.    Stairs             Wheelchair Mobility    Modified Rankin (Stroke Patients Only) Modified Rankin (Stroke Patients Only) Pre-Morbid Rankin Score: No symptoms Modified Rankin: Moderate disability     Balance Overall balance assessment: Needs assistance Sitting-balance support: No upper extremity supported;Feet supported Sitting balance-Leahy Scale: Good   Postural control: Right lateral lean Standing balance support: During functional activity;No upper extremity supported Standing balance-Leahy Scale: Fair Standing balance comment: pt is able to static stand without UE support                High Level Balance Comments: worked on [employer] with UE mobility, SL stance with UE support, eyes closed with wt shifting            Cognition Arousal/Alertness: Awake/alert Behavior During Therapy: WFL for tasks assessed/performed Overall Cognitive Status: Impaired/Different from baseline Area of Impairment: Problem solving                   Current Attention Level: Selective Memory: Decreased short-term memory Following Commands: Follows multi-step commands with increased time Safety/Judgement: Decreased awareness of safety Awareness: Emergent Problem Solving: Slow processing;Requires verbal cues General Comments: problem solving accurately but with increased time and occasionally things needed to be explained twice      Exercises      General Comments General comments (  skin integrity, edema, etc.): SpO2 97%, HR 100 bpm after ambulation. Gave pt ther ex/ balance handout for home      Pertinent Vitals/Pain Pain Assessment: No/denies pain    Home Living                       Prior Function            PT Goals (current goals can now be found in the care plan section) Acute Rehab PT Goals Patient Stated Goal: Return to normal PT Goal Formulation: With patient Time For Goal Achievement: 03/20/18 Potential to Achieve Goals: Good Progress towards PT goals: Progressing toward goals    Frequency    Min 4X/week      PT Plan Current plan remains appropriate    Co-evaluation              AM-PAC PT "6 Clicks" Mobility   Outcome Measure  Help needed turning from your back to your side while in a flat bed without using bedrails?: None Help needed moving from lying on your back to sitting on the side of a flat bed without using bedrails?: None Help needed moving to and from a bed to a chair (including a wheelchair)?: None Help needed standing up from a chair using your arms (e.g., wheelchair or bedside chair)?: None Help needed to walk in hospital room?: A Little Help needed climbing 3-5 steps with a railing? : A Little 6 Click Score: 22    End of Session Equipment Utilized During Treatment: Gait belt Activity Tolerance: Patient tolerated treatment well Patient left: in chair;with call bell/phone within reach Nurse Communication: Mobility status PT Visit Diagnosis: Other abnormalities of gait and mobility (R26.89);Muscle weakness (generalized) (M62.81)     Time: 5462-7035 PT Time Calculation (min) (ACUTE ONLY): 21 min  Charges:  $Gait Training: 8-22 mins                     [place]  Pager [phone] [place] 03/17/2018, 3:55 PM

## 2018-03-17 NOTE — Progress Notes (Signed)
Patient was scheduled for an image-guided cerebral arteriogram with possible revascularization of proximal left ICA stenosis tentatively for today 03/17/2018 with Dr. Estanislado Pandy.  Unfourtanetly, there was an emergent code stroke that required Dr. Arlean Hopping immediate attention at this time. Informed patient that due to this, his procedure will not occur today and will be rescheduled for later this week. Informed patient that I will let him know sometime today when his procedure will be rescheduled. Called patient's daughter [name]) at 0930 to inform her of updates. Will restart diet today. Dr. Verlon Au, [facility] RN, and 6E RN aware.  Bea Graff Salah Nakamura, PA-C 03/17/2018, 9:48 AM

## 2018-03-17 NOTE — Progress Notes (Addendum)
[facility] Progress Note   Dialysis Orders: [place] 3.5h R IJ TDC Patient reported dry weight 155 lbs  Assessment/Plan: 1. Acute CVA- s/p cerebral arteriogram with 90% stenosis of prox LICA, 45% prox RICA, and 50% in distal RICA, severe stenosis of RVA at origin and 80% stenosis of LICA cavernous segment per Dr. Estanislado Pandy. 1. For LICA stent on 09/22/81 by Dr. Estanislado Pandy 2. NSTEMI- s/p PCI of LAD on 03/06/18 on asa/ticagrelor 3. ESRDcontinue with HD qMWF. He is actually below edw. Will need new lower edw at time of discharge.  K 3.8 - 4 K today 4. Anemia:on aranesp 60but also had ABLA due to GIB and transfuse prn (has not been transfused yet) hgb 7.5 pre HD Friday up to 9.3 Saturday post then then 7.4 pre HD Monday up to 8.3 Tuesday- plan repeat Tuesday tsat 22% 2/24 - start short course of Fe - increase next ESA dose to 100 5. CKD-MBD:stable 6. Nutrition:renal diet 7. Hypertension/volume:net UF 2 L Friday with post wt 67 kg/147 Lb - net UF 2.5 Monday with post wt 67.6  - stop IVF post procedure today 8. Disposition-will need to reassess after stent placement but getting stronger daily.- evaluating for CIR vs SNF  Myriam Jacobson, PA-C [place] [phone] 03/17/2018,8:47 AM  LOS: 17 days   Pt seen, examined and agree w A/P as above.  [facility] 03/17/2018, 11:27 AM    Subjective:   awaiting procedure- NPO IV at 75 since 6 am - pre procedure  Objective Vitals:   03/16/18 1238 03/16/18 2050 03/17/18 0439 03/17/18 0839  BP: (!) 160/63 (!) 160/65 (!) 167/61 (!) 154/60  Pulse: 68 70 69   Resp: 16     Temp: 98.1 F (36.7 C) 98.2 F (36.8 C) 98.1 F (36.7 C)   TempSrc: Oral Oral Oral   SpO2: 93% 100% 98%   Weight:   68.4 kg   Height:       Physical Exam General: NAD sitting in bed Heart: RRR Lungs: no rales Abdomen: soft NT ND + BS Extremities: no LE edema Dialysis Access:  Right IJ  [facility]   Additional Objective Labs: Basic Metabolic Panel: Recent Labs  Lab 03/11/18 0723 03/13/18 0646 03/16/18 0729 03/17/18 0054  NA 133* 138 132* 135  K 4.0 3.8 3.8 4.5  CL 97* 100 96* 97*  CO2 23 23 24 25   GLUCOSE 95 109* 140* 114*  BUN 40* 39* 42* 24*  CREATININE 10.24* 11.08* 12.18* 7.83*  CALCIUM 8.0* 8.3* 8.0* 8.2*  PHOS 4.9* 4.7* 4.8*  --    Liver Function Tests: Recent Labs  Lab 03/11/18 0723 03/13/18 0646 03/16/18 0729  ALBUMIN 2.7* 2.6* 2.6*   No results for input(s): LIPASE, AMYLASE in the last 168 hours. CBC: Recent Labs  Lab 03/11/18 0723 03/13/18 0646 03/14/18 0827 03/16/18 0729 03/17/18 0054  WBC 9.7 9.2 9.1 11.1* 10.5  NEUTROABS  --   --  4.4  --  6.0  HGB 8.1* 7.5* 9.3* 7.4* 8.3*  HCT 25.3* 22.7* 29.1* 22.9* 25.6*  MCV 90.0 89.0 89.5 90.5 91.1  PLT 236 244 281 266 301   Blood Culture No results found for: SDES, SPECREQUEST, CULT, REPTSTATUS  Cardiac Enzymes: No results for input(s): CKTOTAL, CKMB, CKMBINDEX, TROPONINI in the last 168 hours. CBG: Recent Labs  Lab 03/11/18 1040  GLUCAP 73   Iron Studies: No results for input(s): IRON, TIBC, TRANSFERRIN, FERRITIN in the last 72 hours.  Lab Results  Component Value Date   INR 1.2 03/17/2018   INR 1.1 03/09/2018   INR 1.08 02/27/2018   Studies/Results: Dg Chest 1 View  Result Date: 03/16/2018 CLINICAL DATA:  Shortness of breath. EXAM: CHEST  1 VIEW COMPARISON:  None. FINDINGS: Heart is enlarged. Aortic atherosclerosis is present. The lungs are hyperinflated. No significant edema present. There are no significant effusions. A right IJ dialysis catheter is in place. The visualized soft tissues and bony thorax are unremarkable. IMPRESSION: 1. Cardiomegaly without failure. 2. Aortic atherosclerosis. 3. Hyperinflation of both lungs, suggesting COPD. Electronically Signed   By: San Morelle M.D.   On: 03/16/2018 18:59   Medications: . sodium chloride 75 mL/hr at 03/17/18 0631  .   ceFAZolin (ANCEF) IV    . ferric gluconate (FERRLECIT/NULECIT) IV Stopped (03/16/18 1241)   . amLODipine  5 mg Oral QHS  . aspirin EC  81 mg Oral Daily  . atorvastatin  40 mg Oral q1800  . Chlorhexidine Gluconate Cloth  6 each Topical Q0600  . darbepoetin (ARANESP) injection - DIALYSIS  60 mcg Intravenous Q Fri-HD  . famotidine  20 mg Oral QHS  . fluticasone  2 spray Each Nare BID  . isosorbide mononitrate  60 mg Oral Daily  . loratadine  10 mg Oral Q48H  . losartan  100 mg Oral Daily  . metoprolol succinate  200 mg Oral Daily  . multivitamin  1 tablet Oral Daily  . niMODipine  0-60 mg Oral 60 min Pre-Op  . polyethylene glycol  17 g Oral Daily  . senna-docusate  1 tablet Oral BID  . ticagrelor  90 mg Oral BID

## 2018-03-17 NOTE — Social Work (Signed)
CSW met with patient at bedside along with RNCM. RNCM offered choice for home health, as PT's recommendations have changed to home health.   CSW reviewed transportation options for dialysis. Reminded patient of SW intern's visit with patient last week. Intern had advised patient to follow up with outpatient dialysis social worker, who can assist with an application for transportation assistance through [employer]. CSW also provided list of alternate transportation resources today.   CSW will sign off, as no additional needs identified.  Estanislado Emms, LCSW [phone]

## 2018-03-17 NOTE — Progress Notes (Signed)
PROGRESS NOTE  Peter [name] WEX:[id number] DOB: 1945-01-31 DOA: 02/27/2018 PCP: Patient, No Pcp Per  Brief Summary:  3 AAM  H/o ESRD on HD x 3 years-prior on PD for 3 years, systolic chf ef 93-81% 2/2 CAD ans Stent x1  02/2016, htn hld Presented to the hospital 02/27/2018 with left sided weakness after dialysis 2/14, found to have Acute stroke 2/19 chest pain, found to have NSTEMI, s/p DES Left ICA stenosis, due to have left ICA stent when available as per interventional radiology  CIR vs snf placement after ICA stent Anemia, checking fobt Renal mass needs follow up  HPI/Recap of past 24 hours:   shortness of breath which patient self reflects as being the manifestation of his anxiety No new issues Ambulatory  Assessment/Plan: Principal Problem:   Cerebrovascular accident (CVA) (Peter [name]) Active Problems:   Elevated troponin   Elevated LFTs   HTN (hypertension)   ESRD on dialysis (Peter [name])   CAD (coronary artery disease)   Diabetes (Peter [name])   CHF (congestive heart failure) (Peter [name])   Traumatic brain injury (Peter [name])   Anemia of chronic disease   Right kidney mass   TIA (transient ischemic attack)   Chronic systolic CHF (congestive heart failure) (Peter [name])   NSTEMI (non-ST elevated myocardial infarction) (Peter [name])   Carotid stenosis   Status post coronary artery stent placement   FTT (failure to thrive) in adult    Acute Stroke:  Multiple infarcts - embolic - unknown source. (presenting symptom)  2/14 CVA + l sided weak after dialysis, He did not receive IV t-PA due to mild deficits -s/p cardiac stent, on asa 81mg  /plavix daily--> Brillinta per IR-Neuro for cerebral stenting -tele has been sinus rhythm since hospitalization--discontinued -Allow high pressures and to LICA stent -needs per neuro TEE/ holter monitor/ loop (pease refer to neurology note by Dr Rosalin Hawking on 2/24)  NSTEMI (2/18-2/19): -had chest pain midnight (2/18-2/19), troponin peaked at 6.4  -s/p cardiac cath on 2/21 with  PCI/DESx1 to the LAD with scoring balloon to ISR in the ostium of OM1. Continue to treat RCA disease medically.  -Needs DAPT with ASA/plavix for at least one year once done with brillinta as per IR   -cardiology recommend 30 day event monitor and TEE/possible ICD evaluation on outpatient basis through his primary cardiology [name] attempted but aborted 2/18] -Need to follow up with [name] in two week after discharge -Stable for the past several days without pain  Chronic systolic heart failure: ef 30-35% volume managed by dialysis F/u with primary cardiology   HTN:  On toprol-xl 200 qd, cozaar 100 qd , imdur 60 qd , norvasc 5 qd , getting dialysis  ESRD on HD MWF Anemia of chronic disease, hgb 8.3 getting aranasp per nephrology.  prbc transfusion and Dialysis per nephrology Will need a lower EDW as weight has declined since all this started Management 2/2 hyperparathyroidism per Neprho  Anemia chr disease hemoccult was + 2/25, then neg 2/29 Hemoglobin up from 7.5-->9.3 without transfusion Cont Aranesp 60 mcg q frday D/w Dr. Silverio Decamp of GI 2/28-given recent MI and stent, and only heme +, not gi hemorrhage would need coordination as OP for timing of colonoscopy Patient hasn't had colonoscopy in the past 2/2 being on blood thinner--[facility]  Right renal mass. Incidental finding on ab [place] from 2/15 "Indeterminate small mass in the lower pole of the right kidney. This could reflect a complex cyst or solid lesion. Further evaluation recommended. Given the patient's renal  insufficiency, noncontrast renal MRI is likely the best available imaging option." Not sure is he still make urine, will try to obtain ua Will need to follow up as outpatient, daughter is made aware to follow up on right renal mass.   Chronic nasal congestion:  Continue flonase, add on claritin Added Ipratropium with good benefit   FTT;  He reports just recovering from a prolonged  hospitalization last year, started driving two weeks ago prior to this hospitalization CIR eval  Code Status: full Family Communication: updated daughter on 2/29 Disposition Plan: Wait timing of stent placement then probably will be under ICU level of care and we can then manage subsequently   Consultants:  Cardiology/EP [name]  Neurology  nephrology  IR [name]  CIR  Procedures:  Dialysis  Cardiac cath on 2/21 with PCI/DESx1 to the LAD with scoring balloon to ISR in the ostium of OM1.  Cerebral angiogram on 2/25  Antibiotics:  none   Objective: BP (!) 154/60   Pulse 69   Temp 98.1 F (36.7 C) (Oral)   Resp 16   Ht 6\' 3"  (1.905 m)   Wt 68.4 kg   SpO2 98%   BMI 18.85 kg/m   Intake/Output Summary (Last 24 hours) at 03/17/2018 1107 Last data filed at 03/16/2018 2100 Gross per 24 hour  Intake 305.66 ml  Output -  Net 305.66 ml   Filed Weights   03/16/18 0718 03/16/18 1104 03/17/18 0439  Weight: 70.3 kg 67.6 kg 68.4 kg    Exam:  awake pleasant alert No ict no pallor cta b s1 s2 no m/r/g Clear no added sound LLE weaker than RLE Neuro overall intact, psych euthymic   Data Reviewed: Basic Metabolic Panel: Recent Labs  Lab 03/11/18 0723 03/13/18 0646 03/16/18 0729 03/17/18 0054  NA 133* 138 132* 135  K 4.0 3.8 3.8 4.5  CL 97* 100 96* 97*  CO2 23 23 24 25   GLUCOSE 95 109* 140* 114*  BUN 40* 39* 42* 24*  CREATININE 10.24* 11.08* 12.18* 7.83*  CALCIUM 8.0* 8.3* 8.0* 8.2*  PHOS 4.9* 4.7* 4.8*  --    Liver Function Tests: Recent Labs  Lab 03/11/18 0723 03/13/18 0646 03/16/18 0729  ALBUMIN 2.7* 2.6* 2.6*   No results for input(s): LIPASE, AMYLASE in the last 168 hours. No results for input(s): AMMONIA in the last 168 hours. CBC: Recent Labs  Lab 03/11/18 0723 03/13/18 0646 03/14/18 0827 03/16/18 0729 03/17/18 0054  WBC 9.7 9.2 9.1 11.1* 10.5  NEUTROABS  --   --  4.4  --  6.0  HGB 8.1* 7.5* 9.3* 7.4* 8.3*  HCT 25.3* 22.7*  29.1* 22.9* 25.6*  MCV 90.0 89.0 89.5 90.5 91.1  PLT 236 244 281 266 301   Cardiac Enzymes:   No results for input(s): CKTOTAL, CKMB, CKMBINDEX, TROPONINI in the last 168 hours. BNP (last 3 results) No results for input(s): BNP in the last 8760 hours.  ProBNP (last 3 results) No results for input(s): PROBNP in the last 8760 hours.  CBG: Recent Labs  Lab 03/11/18 1040  GLUCAP 73    No results found for this or any previous visit (from the past 240 hour(s)).   Studies: Dg Chest 1 View  Result Date: 03/16/2018 CLINICAL DATA:  Shortness of breath. EXAM: CHEST  1 VIEW COMPARISON:  None. FINDINGS: Heart is enlarged. Aortic atherosclerosis is present. The lungs are hyperinflated. No significant edema present. There are no significant effusions. A right IJ dialysis catheter is  in place. The visualized soft tissues and bony thorax are unremarkable. IMPRESSION: 1. Cardiomegaly without failure. 2. Aortic atherosclerosis. 3. Hyperinflation of both lungs, suggesting COPD. Electronically Signed   By: San Morelle M.D.   On: 03/16/2018 18:59    Scheduled Meds: . amLODipine  5 mg Oral QHS  . aspirin EC  81 mg Oral Daily  . atorvastatin  40 mg Oral q1800  . Chlorhexidine Gluconate Cloth  6 each Topical Q0600  . [START ON 03/20/2018] darbepoetin (ARANESP) injection - DIALYSIS  100 mcg Intravenous Q Fri-HD  . famotidine  20 mg Oral QHS  . fluticasone  2 spray Each Nare BID  . isosorbide mononitrate  60 mg Oral Daily  . loratadine  10 mg Oral Q48H  . losartan  100 mg Oral Daily  . metoprolol succinate  200 mg Oral Daily  . multivitamin  1 tablet Oral Daily  . polyethylene glycol  17 g Oral Daily  . senna-docusate  1 tablet Oral BID  . ticagrelor  90 mg Oral BID    Continuous Infusions: . ferric gluconate (FERRLECIT/NULECIT) IV Stopped (03/16/18 1241)    15 min  Verneita Griffes, MD [facility] 11:07 AM

## 2018-03-18 LAB — RENAL FUNCTION PANEL
Albumin: 2.9 g/dL — ABNORMAL LOW (ref 3.5–5.0)
Anion gap: 11 (ref 5–15)
BUN: 39 mg/dL — ABNORMAL HIGH (ref 8–23)
CO2: 24 mmol/L (ref 22–32)
Calcium: 8.3 mg/dL — ABNORMAL LOW (ref 8.9–10.3)
Chloride: 97 mmol/L — ABNORMAL LOW (ref 98–111)
Creatinine, Ser: 10.84 mg/dL — ABNORMAL HIGH (ref 0.61–1.24)
GFR calc Af Amer: 5 mL/min — ABNORMAL LOW (ref >60)
GFR calc non Af Amer: 4 mL/min — ABNORMAL LOW (ref >60)
Glucose, Bld: 124 mg/dL — ABNORMAL HIGH (ref 70–99)
Phosphorus: 4.3 mg/dL (ref 2.5–4.6)
Potassium: 4.5 mmol/L (ref 3.5–5.1)
Sodium: 132 mmol/L — ABNORMAL LOW (ref 135–145)

## 2018-03-18 LAB — GLUCOSE, CAPILLARY: Glucose-Capillary: 95 mg/dL (ref 70–99)

## 2018-03-18 LAB — CBC
HCT: 23.2 % — ABNORMAL LOW (ref 39.0–52.0)
Hemoglobin: 7.3 g/dL — ABNORMAL LOW (ref 13.0–17.0)
MCH: 29 pg (ref 26.0–34.0)
MCHC: 31.5 g/dL (ref 30.0–36.0)
MCV: 92.1 fL (ref 80.0–100.0)
Platelets: 275 10*3/uL (ref 150–400)
RBC: 2.52 MIL/uL — ABNORMAL LOW (ref 4.22–5.81)
RDW: 16 % — ABNORMAL HIGH (ref 11.5–15.5)
WBC: 11.1 10*3/uL — ABNORMAL HIGH (ref 4.0–10.5)
nRBC: 0 % (ref 0.0–0.2)

## 2018-03-18 MED ORDER — HEPARIN SODIUM (PORCINE) 1000 UNIT/ML IJ SOLN
INTRAMUSCULAR | Status: AC
  Administered 2018-03-18: 3500 [IU] via INTRAVENOUS_CENTRAL
  Filled 2018-03-18: qty 4

## 2018-03-18 MED ORDER — TICAGRELOR 90 MG PO TABS: 90.0000 mg | ORAL_TABLET | Freq: Once | ORAL | Status: DC

## 2018-03-18 MED ORDER — HEPARIN SODIUM (PORCINE) 1000 UNIT/ML DIALYSIS
1000.0000 [IU] | INTRAMUSCULAR | Status: DC | PRN
  Administered 2018-03-18: 3500 [IU] via INTRAVENOUS_CENTRAL
  Filled 2018-03-18: qty 1

## 2018-03-18 MED ORDER — SODIUM CHLORIDE 0.9 % IV SOLN
INTRAVENOUS | Status: DC
  Administered 2018-03-20: 06:00:00 via INTRAVENOUS

## 2018-03-18 MED ORDER — AMLODIPINE BESYLATE 10 MG PO TABS
10.0000 mg | ORAL_TABLET | Freq: Every day | ORAL | Status: DC
  Administered 2018-03-18 – 2018-03-28 (×11): 10 mg via ORAL
  Filled 2018-03-18 (×11): qty 1

## 2018-03-18 MED ORDER — SODIUM CHLORIDE 0.9 % IV SOLN: 100.0000 mL | INTRAVENOUS | Status: DC | PRN

## 2018-03-18 MED ORDER — ASPIRIN EC 325 MG PO TBEC: 325.0000 mg | DELAYED_RELEASE_TABLET | ORAL | Status: DC

## 2018-03-18 MED ORDER — PENTAFLUOROPROP-TETRAFLUOROETH EX AERO: 1.0000 "application " | INHALATION_SPRAY | CUTANEOUS | Status: DC | PRN

## 2018-03-18 MED ORDER — NIMODIPINE 30 MG PO CAPS: 0.0000 mg | ORAL_CAPSULE | ORAL | Status: DC

## 2018-03-18 MED ORDER — LIDOCAINE HCL (PF) 1 % IJ SOLN: 5.0000 mL | INTRAMUSCULAR | Status: DC | PRN

## 2018-03-18 MED ORDER — CEFAZOLIN SODIUM-DEXTROSE 2-4 GM/100ML-% IV SOLN
2.0000 g | INTRAVENOUS | Status: AC
  Administered 2018-03-20: 2 g via INTRAVENOUS
  Filled 2018-03-18: qty 100

## 2018-03-18 MED ORDER — ALTEPLASE 2 MG IJ SOLR: 2.0000 mg | Freq: Once | INTRAMUSCULAR | Status: DC | PRN

## 2018-03-18 MED ORDER — LIDOCAINE-PRILOCAINE 2.5-2.5 % EX CREA
1.0000 "application " | TOPICAL_CREAM | CUTANEOUS | Status: DC | PRN
  Filled 2018-03-18: qty 5

## 2018-03-18 NOTE — Progress Notes (Signed)
PT Cancellation Note  Patient Details Name: Peter [name] MRN: [id number] DOB: [birth year]   Cancelled Treatment:    Reason Eval/Treat Not Completed: Patient at procedure or test/unavailable. Patient currently off floor at hemodialysis. Will follow up as schedule allows.   Erick Blinks, SPT  Erick Blinks 03/18/2018, 10:17 AM

## 2018-03-18 NOTE — Plan of Care (Signed)
  Problem: Education: Goal: Knowledge of General Education information will improve Description Including pain rating scale, medication(s)/side effects and non-pharmacologic comfort measures Outcome: Progressing   Problem: Education: Goal: Knowledge of disease or condition will improve Outcome: Progressing Goal: Knowledge of secondary prevention will improve Outcome: Progressing Goal: Knowledge of patient specific risk factors addressed and post discharge goals established will improve Outcome: Progressing

## 2018-03-18 NOTE — Progress Notes (Signed)
[facility] Progress Note   Dialysis Orders: Peter [name] 3.5h R IJ TDC 149lbs  Hep 2700 then 500u/hr - hect 1.5 ug IV tiw - ferrlicit - no esa  Home meds: - amlodipine 5 hs/ losartan 100/ metoprolol xl 200/ furosemide 40 bid - aspirin 325 qd/ atorvastatin 40/ sl ntg prn - famotidine 20 hs/ vitamins/ prn's  Assessment/Plan: 1. 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 LICA stent postponed to 03/20/18. Will plan on HD on alternate day, probably Saturday.  2. NSTEMI- s/p PCI of LAD on 03/06/18 on asa/ticagrelor 3. [place] [facility] qMWF. At dry wt today. HD today.  4. Anemia ckd/ blood draws/ guiac+: Hb 13 on admit, no esa at OP unit >> Hb 7.9 on 2/22 started esa w/ darbe 60 ug 2/23 and 2/28, next dose 100ug darbe on 3/6 and q Friday. tsat 22% 2/24 - started 500mg  load of Fe. Hb 8 range, hasn't required prbc's.  5. Anemia/ Guiac+ - not active bleed, seen by GI 2/28, no intervention now given recent MI/ stent 6. CKD-MBD:stable 7. Nutrition:renal diet 8. HTN: on losartan 100/ metop xl 200 qd/ amlodipine 5 with high SBP's will ^amlodipine to 10/d  9. Disposition-evaluating for CIR vs SNF   [facility] 03/18/2018, 11:31 AM    Subjective:   States the stent procedure delayed to Friday. On HD now. No c/o.   Objective Vitals:   03/18/18 0900 03/18/18 0930 03/18/18 0945 03/18/18 1029  BP: (!) 174/61 (!) 170/57 (!) 158/64 (!) 146/58  Pulse: 69 67 67 66  Resp:    16  Temp:    98.2 F (36.8 C)  TempSrc:    Oral  SpO2:    94%  Weight:    67.2 kg  Height:       Physical Exam General: NAD sitting in bed Heart: RRR Lungs: no rales Abdomen: soft NT ND + BS Extremities: no LE edema Dialysis Access:  Right IJ [facility]   Additional Objective Labs: Basic Metabolic Panel: Recent Labs  Lab 03/13/18 0646 03/16/18 0729 03/17/18 0054 03/18/18 0500  NA 138 132* 135 132*  K 3.8 3.8 4.5 4.5  CL 100 96* 97* 97*   CO2 23 24 25 24   GLUCOSE 109* 140* 114* 124*  BUN 39* 42* 24* 39*  CREATININE 11.08* 12.18* 7.83* 10.84*  CALCIUM 8.3* 8.0* 8.2* 8.3*  PHOS 4.7* 4.8*  --  4.3   Liver Function Tests: Recent Labs  Lab 03/13/18 0646 03/16/18 0729 03/18/18 0500  ALBUMIN 2.6* 2.6* 2.9*   No results for input(s): LIPASE, AMYLASE in the last 168 hours. CBC: Recent Labs  Lab 03/13/18 0646 03/14/18 0827 03/16/18 0729 03/17/18 0054 03/18/18 0500  WBC 9.2 9.1 11.1* 10.5 11.1*  NEUTROABS  --  4.4  --  6.0  --   HGB 7.5* 9.3* 7.4* 8.3* 7.3*  HCT 22.7* 29.1* 22.9* 25.6* 23.2*  MCV 89.0 89.5 90.5 91.1 92.1  PLT 244 281 266 301 275   Blood Culture No results found for: SDES, SPECREQUEST, CULT, REPTSTATUS  Cardiac Enzymes: No results for input(s): CKTOTAL, CKMB, CKMBINDEX, TROPONINI in the last 168 hours. CBG: Recent Labs  Lab 03/18/18 1105  GLUCAP 95   Iron Studies: No results for input(s): IRON, TIBC, TRANSFERRIN, FERRITIN in the last 72 hours. Lab Results  Component Value Date   INR 1.2 03/17/2018   INR 1.1 03/09/2018   INR 1.08 02/27/2018   Studies/Results: Dg Chest 1 View  Result Date: 03/16/2018 CLINICAL DATA:  Shortness of breath. EXAM: CHEST  1 VIEW COMPARISON:  None. FINDINGS: Heart is enlarged. Aortic atherosclerosis is present. The lungs are hyperinflated. No significant edema present. There are no significant effusions. A right IJ dialysis catheter is in place. The visualized soft tissues and bony thorax are unremarkable. IMPRESSION: 1. Cardiomegaly without failure. 2. Aortic atherosclerosis. 3. Hyperinflation of both lungs, suggesting COPD. Electronically Signed   By: San Morelle M.D.   On: 03/16/2018 18:59   Medications: . [START ON 03/20/2018] sodium chloride    . [START ON 03/20/2018]  ceFAZolin (ANCEF) IV    . ferric gluconate (FERRLECIT/NULECIT) IV 125 mg (03/18/18 0919)   . amLODipine  5 mg Oral QHS  . [START ON 03/20/2018] aspirin EC  325 mg Oral 60 min Pre-Op  .  aspirin EC  81 mg Oral Daily  . atorvastatin  40 mg Oral q1800  . Chlorhexidine Gluconate Cloth  6 each Topical Q0600  . [START ON 03/20/2018] darbepoetin (ARANESP) injection - DIALYSIS  100 mcg Intravenous Q Fri-HD  . famotidine  20 mg Oral QHS  . fluticasone  2 spray Each Nare BID  . isosorbide mononitrate  60 mg Oral Daily  . loratadine  10 mg Oral Q48H  . losartan  100 mg Oral Daily  . metoprolol succinate  200 mg Oral Daily  . multivitamin  1 tablet Oral Daily  . [START ON 03/20/2018] niMODipine  0-60 mg Oral 60 min Pre-Op  . polyethylene glycol  17 g Oral Daily  . senna-docusate  1 tablet Oral BID  . ticagrelor  90 mg Oral BID  . [START ON 03/20/2018] ticagrelor  90 mg Oral Once

## 2018-03-18 NOTE — Plan of Care (Signed)
  Problem: Education: Goal: Knowledge of General Education information will improve Description Including pain rating scale, medication(s)/side effects and non-pharmacologic comfort measures Outcome: Progressing   Problem: Education: Goal: Knowledge of disease or condition will improve Outcome: Progressing Goal: Knowledge of secondary prevention will improve Outcome: Progressing Goal: Knowledge of patient specific risk factors addressed and post discharge goals established will improve Outcome: Progressing   Problem: Education: Goal: Understanding of cardiac disease, CV risk reduction, and recovery process will improve Outcome: Progressing Goal: Understanding of medication regimen will improve Outcome: Progressing

## 2018-03-18 NOTE — Progress Notes (Signed)
  Speech Language Pathology Treatment: Cognitive-Linquistic  Patient Details Name: Peter [name] MRN: [id number] DOB: 12/04/1945 Today's Date: 03/18/2018 Time: 0110-0349 SLP Time Calculation (min) (ACUTE ONLY): 20 min  Assessment / Plan / Recommendation Clinical Impression  Pt was seen for skilled cognitive-linguistic treatment session. Pt continues to report word-finding difficulties, however during diagnostic treatment to further assess, pt remained functional at the conversational level. Suspect pt is Mod I for compensating for word-finding. During conversation regarding discharge plans, pt recalled relevant details of his hospitalization and upcoming procedures. He also recalled details of previous ST sessions (~1 week ago) with supervision verbal prompts. Furthermore, pt demonstrated good awareness into his current cognitive function in stating that he is somewhat limited in his activities in acute medical environment, thus cognitive demands are also limited. Recommend ST continue to follow to provide treatment for cognition while pt remains in acute setting.   HPI HPI: Pt is a 73 y.o. male admitted 02/27/18 with L-sided weakness while at dialysis. MRI shows small volume diffusion abnormality involving parasagittal cerebral hemispheres, additional L frontal cortical involvement compatible with acute ischemia; remote bilat cerebellar infarcts. PMH includes HTN, ESRD on HD.      SLP Plan  Continue with current plan of care       Recommendations                   Oral Care Recommendations: Oral care BID Follow up Recommendations: 24 hour supervision/assistance;Inpatient Rehab SLP Visit Diagnosis: Cognitive communication deficit (Y11.643) Plan: Continue with current plan of care       Jettie Booze, Student SLP                 Jettie Booze 03/18/2018, 3:12 PM

## 2018-03-18 NOTE — Progress Notes (Signed)
PROGRESS NOTE    Peter [name]  FUX:[id number] DOB: 1945-12-20 DOA: 02/27/2018 PCP: Patient, No Pcp Per   Brief Narrative:  HPI On 02/27/2018 by Dr. Shela Leff Peter [name] is a 73 y.o. male with a past medical history of ESRD on HD, hypertension being brought to the hospital from his dialysis center due to strokelike symptoms with left sided weakness. Last known normal 3:15 this afternoon.  Patient states after finishing dialysis today he felt weak and could not get out of the chair.  Both of his legs felt weak and his right leg was shaking.  He could not lift his arms.  States he was told that his left arm was not moving.  Denies any history of prior stroke.  He is a former smoker; quit 4 years ago.  He was previously smoking 1/2 pack/day for about 20 years.  He is complaining of a headache.  Denies having any chest pain.  No additional history could be obtained from the patient.  Interim history Patient presented to the hospital with left-sided weakness after dialysis on 02/27/2018 and was found to have an acute stroke, neurology consulted.  Also noted to have an end STEMI on 03/04/2018 with chest pain.  Status post DES.  Pending stent placement ACA which will be done on 03/20/2018 at 8 AM by interventional radiology. Assessment & Plan   Acute CVA -Embolic, unknown source -Patient presented with left-sided weakness after dialysis he did not receive TPA due to mild deficits -Neurology consulted and appreciated -Cerebral arteriogram showed 90% stenosis of the proximal left ICA.  Left ICA stent will be placed on 03/20/2018 by interventional radiology. -Echocardiogram as listed below -Currently on aspirin anticoagulant or  NSTEMI -Patient did have chest pain the night of 03/03/2018 03/04/2018, with a peak troponin of 6.4 -Cardiology consulted and appreciated -Status post cardiac catheterization on 03/06/2018 with PCI/DES x1 to the LAD with scoring balloon to ISR in the ostium of OM1.  Continue  to treat RCA disease medically. -Patient will need DAPT with aspirin and Plavix for at least 1 year once done with Brilinta as per interventional radiology recommendation -Cardiology recommended 30-day event monitor with TEE/possible ICD evaluation on outpatient basis through his primary cardiologist, Dr. Beatrix Fetters -Of note, TEE was attempted but aborted on 03/03/2018, as patient was unable to be sedated adequately -No further complaints of chest pain  Chronic systolic congestive heart failure -Echocardiogram on 02/28/2018 showed an EF of 30 to 35%, cavity size mildly dilated.  Diffuse hypokinesis.  Impaired relaxation of left ventricle -Continue volume management with dialysis -Follow-up with cardiology as an outpatient  ESRD -Patient dialyzes Monday Wednesday Friday -Nephrology consulted and appreciated  Anemia of chronic disease -FOBT was positive however does not appear to be actively bleeding. -Previous hospitalist discussed with gastroenterologist, Dr. Rush Landmark on 03/13/2018, given his recent MI and stent placement, heme positive, would recommend outpatient follow-up for colonoscopy -Hemoglobin currently 7.3 -Continue to monitor CBC  Right renal mass -Incidental finding from abdominal ultrasound on 02/28/2018: Small mass in the lower pole of the right kidney, could reflect complex cyst or solid lesion.  Noncontrast MRI best available imaging option. -Will need to follow-up as an outpatient  Nasal congestion -Continue Claritin, Flonase, ipratropium  Essential hypertension -Stable, continue amlodipine, losartan, metoprolol  Failure to thrive -Likely due to prolonged hospitalization -Patient has progressed with physical therapy, now recommending home health  DVT Prophylaxis  SCDs  Code Status: Full  Family Communication: none at bedside  Disposition Plan: Admitted.  Pending procedure on 03/20/2018.   Consultants Neurology Interventional  radiology Nephrology Gastroenterology Inpatient rehab Cardiology  Procedures  Cardiogram Cardiac catheterization Cerebral angiogram  Antibiotics   Anti-infectives (From admission, onward)   Start     Dose/Rate Route Frequency Ordered Stop   03/20/18 0000  ceFAZolin (ANCEF) IVPB 2g/100 mL premix     2 g 200 mL/hr over 30 Minutes Intravenous 60 min pre-op 03/18/18 0112     03/17/18 0600  ceFAZolin (ANCEF) IVPB 2g/100 mL premix  Status:  Discontinued     2 g 200 mL/hr over 30 Minutes Intravenous To Short Stay 03/16/18 0729 03/17/18 0950      Subjective:   [name] seen and examined today.  Seen in dialysis this morning.  Has no complaints.  Denies current chest pain, shortness of breath, abdominal pain, nausea or vomiting, diarrhea or constipation, dizziness or headache.  Objective:   Vitals:   03/18/18 0930 03/18/18 0945 03/18/18 1029 03/18/18 1129  BP: (!) 170/57 (!) 158/64 (!) 146/58 (!) 175/57  Pulse: 67 67 66   Resp:   16   Temp:   98.2 F (36.8 C)   TempSrc:   Oral   SpO2:   94%   Weight:   67.2 kg   Height:        Intake/Output Summary (Last 24 hours) at 03/18/2018 1418 Last data filed at 03/18/2018 1029 Gross per 24 hour  Intake 240 ml  Output 2000 ml  Net -1760 ml   Filed Weights   03/18/18 0636 03/18/18 0650 03/18/18 1029  Weight: 69.6 kg 69.5 kg 67.2 kg    Exam  General: Well developed, well nourished, NAD, appears stated age  15: NCAT, mucous membranes moist.   Neck: Supple  Cardiovascular: S1 S2 auscultated, RRR, no murmur  Respiratory: Clear to auscultation bilaterally with equal chest rise  Abdomen: Soft, nontender, nondistended, + bowel sounds  Extremities: warm dry without cyanosis clubbing or edema  Neuro: AAOx3, nonfocal  Psych: Appropriate mood and affect, pleasant   Data Reviewed: I have personally reviewed following labs and imaging studies  CBC: Recent Labs  Lab 03/13/18 0646 03/14/18 0827 03/16/18 0729  03/17/18 0054 03/18/18 0500  WBC 9.2 9.1 11.1* 10.5 11.1*  NEUTROABS  --  4.4  --  6.0  --   HGB 7.5* 9.3* 7.4* 8.3* 7.3*  HCT 22.7* 29.1* 22.9* 25.6* 23.2*  MCV 89.0 89.5 90.5 91.1 92.1  PLT 244 281 266 301 976   Basic Metabolic Panel: Recent Labs  Lab 03/13/18 0646 03/16/18 0729 03/17/18 0054 03/18/18 0500  NA 138 132* 135 132*  K 3.8 3.8 4.5 4.5  CL 100 96* 97* 97*  CO2 23 24 25 24   GLUCOSE 109* 140* 114* 124*  BUN 39* 42* 24* 39*  CREATININE 11.08* 12.18* 7.83* 10.84*  CALCIUM 8.3* 8.0* 8.2* 8.3*  PHOS 4.7* 4.8*  --  4.3   GFR: Estimated Creatinine Clearance: 6 mL/min (A) (by C-G formula based on SCr of 10.84 mg/dL (H)). Liver Function Tests: Recent Labs  Lab 03/13/18 0646 03/16/18 0729 03/18/18 0500  ALBUMIN 2.6* 2.6* 2.9*   No results for input(s): LIPASE, AMYLASE in the last 168 hours. No results for input(s): AMMONIA in the last 168 hours. Coagulation Profile: Recent Labs  Lab 03/17/18 0054  INR 1.2   Cardiac Enzymes: No results for input(s): CKTOTAL, CKMB, CKMBINDEX, TROPONINI in the last 168 hours. BNP (last 3 results) No results for input(s): PROBNP in the last 8760 hours. HbA1C: No  results for input(s): HGBA1C in the last 72 hours. CBG: Recent Labs  Lab 03/18/18 1105  GLUCAP 95   Lipid Profile: No results for input(s): CHOL, HDL, LDLCALC, TRIG, CHOLHDL, LDLDIRECT in the last 72 hours. Thyroid Function Tests: No results for input(s): TSH, T4TOTAL, FREET4, T3FREE, THYROIDAB in the last 72 hours. Anemia Panel: No results for input(s): VITAMINB12, FOLATE, FERRITIN, TIBC, IRON, RETICCTPCT in the last 72 hours. Urine analysis: No results found for: COLORURINE, APPEARANCEUR, LABSPEC, PHURINE, GLUCOSEU, HGBUR, BILIRUBINUR, KETONESUR, PROTEINUR, UROBILINOGEN, NITRITE, LEUKOCYTESUR Sepsis Labs: @LABRCNTIP (procalcitonin:4,lacticidven:4)  )No results found for this or any previous visit (from the past 240 hour(s)).    Radiology Studies: Dg Chest  1 View  Result Date: 03/16/2018 CLINICAL DATA:  Shortness of breath. EXAM: CHEST  1 VIEW COMPARISON:  None. FINDINGS: Heart is enlarged. Aortic atherosclerosis is present. The lungs are hyperinflated. No significant edema present. There are no significant effusions. A right IJ dialysis catheter is in place. The visualized soft tissues and bony thorax are unremarkable. IMPRESSION: 1. Cardiomegaly without failure. 2. Aortic atherosclerosis. 3. Hyperinflation of both lungs, suggesting COPD. Electronically Signed   By: San Morelle M.D.   On: 03/16/2018 18:59     Scheduled Meds: . amLODipine  10 mg Oral QHS  . [START ON 03/20/2018] aspirin EC  325 mg Oral 60 min Pre-Op  . aspirin EC  81 mg Oral Daily  . atorvastatin  40 mg Oral q1800  . Chlorhexidine Gluconate Cloth  6 each Topical Q0600  . [START ON 03/20/2018] darbepoetin (ARANESP) injection - DIALYSIS  100 mcg Intravenous Q Fri-HD  . famotidine  20 mg Oral QHS  . fluticasone  2 spray Each Nare BID  . isosorbide mononitrate  60 mg Oral Daily  . loratadine  10 mg Oral Q48H  . losartan  100 mg Oral Daily  . metoprolol succinate  200 mg Oral Daily  . multivitamin  1 tablet Oral Daily  . [START ON 03/20/2018] niMODipine  0-60 mg Oral 60 min Pre-Op  . polyethylene glycol  17 g Oral Daily  . senna-docusate  1 tablet Oral BID  . ticagrelor  90 mg Oral BID  . [START ON 03/20/2018] ticagrelor  90 mg Oral Once   Continuous Infusions: . [START ON 03/20/2018] sodium chloride    . [START ON 03/20/2018]  ceFAZolin (ANCEF) IV    . ferric gluconate (FERRLECIT/NULECIT) IV 125 mg (03/18/18 0919)     LOS: 18 days   Time Spent in minutes   30 minutes  Gianny Sabino D.O. on 03/18/2018 at 2:18 PM  Between 7am to 7pm - Please see pager noted on [email or web address]  After 7pm go to [email or web address]  And look for the night coverage person covering for me after hours  [facility] Office  [phone]

## 2018-03-18 NOTE — Care Management Important Message (Signed)
Important Message  Patient Details  Name: Peter [name] MRN: [id number] Date of Birth: June 30, 1945   Medicare Important Message Given:  Yes    Jocie Meroney P [place] 03/18/2018, 2:03 PM

## 2018-03-19 LAB — RENAL FUNCTION PANEL
ALBUMIN: 2.7 g/dL — AB (ref 3.5–5.0)
Anion gap: 13 (ref 5–15)
BUN: 27 mg/dL — ABNORMAL HIGH (ref 8–23)
CO2: 25 mmol/L (ref 22–32)
Calcium: 8.2 mg/dL — ABNORMAL LOW (ref 8.9–10.3)
Chloride: 96 mmol/L — ABNORMAL LOW (ref 98–111)
Creatinine, Ser: 8.03 mg/dL — ABNORMAL HIGH (ref 0.61–1.24)
GFR calc Af Amer: 7 mL/min — ABNORMAL LOW (ref >60)
GFR calc non Af Amer: 6 mL/min — ABNORMAL LOW (ref >60)
Glucose, Bld: 130 mg/dL — ABNORMAL HIGH (ref 70–99)
Phosphorus: 3.8 mg/dL (ref 2.5–4.6)
Potassium: 3.9 mmol/L (ref 3.5–5.1)
Sodium: 134 mmol/L — ABNORMAL LOW (ref 135–145)

## 2018-03-19 LAB — CBC
HCT: 22.6 % — ABNORMAL LOW (ref 39.0–52.0)
Hemoglobin: 7.2 g/dL — ABNORMAL LOW (ref 13.0–17.0)
MCH: 29 pg (ref 26.0–34.0)
MCHC: 31.9 g/dL (ref 30.0–36.0)
MCV: 91.1 fL (ref 80.0–100.0)
Platelets: 273 10*3/uL (ref 150–400)
RBC: 2.48 MIL/uL — ABNORMAL LOW (ref 4.22–5.81)
RDW: 16.3 % — ABNORMAL HIGH (ref 11.5–15.5)
WBC: 11 10*3/uL — ABNORMAL HIGH (ref 4.0–10.5)
nRBC: 0 % (ref 0.0–0.2)

## 2018-03-19 LAB — PREPARE RBC (CROSSMATCH)

## 2018-03-19 MED ORDER — CHLORHEXIDINE GLUCONATE CLOTH 2 % EX PADS: 6.0000 | MEDICATED_PAD | Freq: Every day | CUTANEOUS | Status: DC

## 2018-03-19 MED ORDER — SODIUM CHLORIDE 0.9% IV SOLUTION: Freq: Once | INTRAVENOUS | Status: DC

## 2018-03-19 MED ORDER — TICAGRELOR 90 MG PO TABS
90.0000 mg | ORAL_TABLET | Freq: Once | ORAL | Status: AC
  Administered 2018-03-20: 90 mg via ORAL
  Filled 2018-03-19: qty 1

## 2018-03-19 NOTE — Progress Notes (Signed)
Spoke with RN re: IR procedure planned for tomorrow AM.  All orders in place.   Brynda Greathouse, MS RD PA-C

## 2018-03-19 NOTE — Progress Notes (Addendum)
[facility] Progress Note   Dialysis Orders: [place] 3.5h R IJ TDC 149lbs  Hep 2700 then 500u/hr - hect 1.5 ug IV tiw - ferrlicit - no esa  Home meds: - amlodipine 5 hs/ losartan 100/ metoprolol xl 200/ furosemide 40 bid - aspirin 325 qd/ atorvastatin 40/ sl ntg prn - famotidine 20 hs/ vitamins/ prn's  Assessment/Plan: 1. 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 LICA stent postponed to 03/20/18. Will plan on HD on alternate day, probably Saturday.  2. NSTEMI- s/p PCI of LAD on 03/06/18 on asa/ticagrelor 3. [place] HD qMWF. At dry wt today. HD Friday. Dont trust bed wts.   4. Anemia of ckd/ blood draws/ guiac+: Hb 13 on admit, no esa at OP unit >> Hb 7.9 on 2/22 started esa w/ darbe 60 ug 2/23 and 2/28, next dose 100ug darbe on 3/6 and q Friday. tsat 22% 2/24 - started 500mg  load of Fe. Hb 8 range, hasn't required prbc's.  5. Anemia/ Guiac+ - not active bleed, seen by GI 2/28, no intervention now given recent MI/ stent 6. CKD-MBD:stable 7. Nutrition:renal diet 8. HTN: on losartan 100/ metop xl 200 qd/ amlodipine 5 > 10/qd 9. Disposition-evaluating for CIR vs SNF   [facility] 03/19/2018, 11:49 AM    Subjective:   Awaiting procedure tomorrow, no new c/o.   Objective Vitals:   03/18/18 2142 03/19/18 0625 03/19/18 1046 03/19/18 1120  BP: (!) 185/75 (!) 157/64 (!) 155/57 (!) 164/59  Pulse: 71 69 70 72  Resp: 18 19 16 16   Temp: 98.2 F (36.8 C) 98.7 F (37.1 C) 97.9 F (36.6 C) (!) 97.5 F (36.4 C)  TempSrc: Oral Oral Oral Oral  SpO2: 100% (!) 88% 100% 100%  Weight:  67.1 kg    Height:       Physical Exam General: NAD sitting in bed Heart: RRR Lungs: no rales Abdomen: soft NT ND + BS Extremities: no LE edema, no UE edema Dialysis Access:  Right IJ [facility]   Additional Objective Labs: Basic Metabolic Panel: Recent Labs  Lab 03/16/18 0729 03/17/18 0054 03/18/18 0500  03/19/18 0518  NA 132* 135 132* 134*  K 3.8 4.5 4.5 3.9  CL 96* 97* 97* 96*  CO2 24 25 24 25   GLUCOSE 140* 114* 124* 130*  BUN 42* 24* 39* 27*  CREATININE 12.18* 7.83* 10.84* 8.03*  CALCIUM 8.0* 8.2* 8.3* 8.2*  PHOS 4.8*  --  4.3 3.8   Liver Function Tests: Recent Labs  Lab 03/16/18 0729 03/18/18 0500 03/19/18 0518  ALBUMIN 2.6* 2.9* 2.7*   No results for input(s): LIPASE, AMYLASE in the last 168 hours. CBC: Recent Labs  Lab 03/14/18 0827 03/16/18 0729 03/17/18 0054 03/18/18 0500 03/19/18 0518  WBC 9.1 11.1* 10.5 11.1* 11.0*  NEUTROABS 4.4  --  6.0  --   --   HGB 9.3* 7.4* 8.3* 7.3* 7.2*  HCT 29.1* 22.9* 25.6* 23.2* 22.6*  MCV 89.5 90.5 91.1 92.1 91.1  PLT 281 266 301 275 273   Blood Culture No results found for: SDES, SPECREQUEST, CULT, REPTSTATUS  Cardiac Enzymes: No results for input(s): CKTOTAL, CKMB, CKMBINDEX, TROPONINI in the last 168 hours. CBG: Recent Labs  Lab 03/18/18 1105  GLUCAP 95   Iron Studies: No results for input(s): IRON, TIBC, TRANSFERRIN, FERRITIN in the last 72 hours. Lab Results  Component Value Date   INR 1.2 03/17/2018   INR 1.1 03/09/2018   INR  1.08 02/27/2018   Studies/Results: No results found. Medications: . [START ON 03/20/2018] sodium chloride    . [START ON 03/20/2018]  ceFAZolin (ANCEF) IV    . ferric gluconate (FERRLECIT/NULECIT) IV 125 mg (03/18/18 0919)   . sodium chloride   Intravenous Once  . amLODipine  10 mg Oral QHS  . [START ON 03/20/2018] aspirin EC  325 mg Oral 60 min Pre-Op  . aspirin EC  81 mg Oral Daily  . atorvastatin  40 mg Oral q1800  . Chlorhexidine Gluconate Cloth  6 each Topical Q0600  . [START ON 03/20/2018] darbepoetin (ARANESP) injection - DIALYSIS  100 mcg Intravenous Q Fri-HD  . famotidine  20 mg Oral QHS  . fluticasone  2 spray Each Nare BID  . isosorbide mononitrate  60 mg Oral Daily  . loratadine  10 mg Oral Q48H  . losartan  100 mg Oral Daily  . metoprolol succinate  200 mg Oral Daily  .  multivitamin  1 tablet Oral Daily  . [START ON 03/20/2018] niMODipine  0-60 mg Oral 60 min Pre-Op  . polyethylene glycol  17 g Oral Daily  . senna-docusate  1 tablet Oral BID  . ticagrelor  90 mg Oral BID  . [START ON 03/20/2018] ticagrelor  90 mg Oral Once

## 2018-03-19 NOTE — Progress Notes (Signed)
PROGRESS NOTE    Peter [name]  ZDG:[id number] DOB: [birth year] DOA: 02/27/2018 PCP: Patient, No Pcp Per   Brief Narrative:  HPI On 02/27/2018 by Dr. Shela Leff Peter [name] is a 73 y.o. male with a past medical history of ESRD on HD, hypertension being brought to the hospital from his dialysis center due to strokelike symptoms with left sided weakness. Last known normal 3:15 this afternoon.  Patient states after finishing dialysis today he felt weak and could not get out of the chair.  Both of his legs felt weak and his right leg was shaking.  He could not lift his arms.  States he was told that his left arm was not moving.  Denies any history of prior stroke.  He is a former smoker; quit 4 years ago.  He was previously smoking 1/2 pack/day for about 20 years.  He is complaining of a headache.  Denies having any chest pain.  No additional history could be obtained from the patient.  Interim history Patient presented to the hospital with left-sided weakness after dialysis on 02/27/2018 and was found to have an acute stroke, neurology consulted.  Also noted to have an end STEMI on 03/04/2018 with chest pain.  Status post DES.  Pending stent placement ACA which will be done on 03/20/2018 at 8 AM by interventional radiology. Assessment & Plan   Acute CVA -Embolic, unknown source -Patient presented with left-sided weakness after dialysis he did not receive TPA due to mild deficits -Neurology consulted and appreciated -Cerebral arteriogram showed 90% stenosis of the proximal left ICA.  Left ICA stent will be placed on 03/20/2018 by interventional radiology. -Echocardiogram as listed below -Continue aspirin, brilinta, statin  Left ICA stenosis -IR consulted, planning for procedure on 03/20/2018 -Dr. Estanislado Pandy recommending discontinuing Plavix, and starting Brilinta and has been following P2Y12   NSTEMI -Patient did have chest pain the night of 03/03/2018 03/04/2018, with a peak troponin of  6.4 -Cardiology consulted and appreciated -Status post cardiac catheterization on 03/06/2018 with PCI/DES x1 to the LAD with scoring balloon to ISR in the ostium of OM1.  Continue to treat RCA disease medically. -Patient will need DAPT with aspirin and Plavix for at least 1 year once done with Brilinta as per interventional radiology recommendation -Cardiology recommended 30-day event monitor with TEE/possible ICD evaluation on outpatient basis through his primary cardiologist, Dr. Beatrix Fetters -Of note, TEE was attempted but aborted on 03/03/2018, as patient was unable to be sedated adequately -No further complaints of chest pain  Chronic systolic congestive heart failure -Echocardiogram on 02/28/2018 showed an EF of 30 to 35%, cavity size mildly dilated.  Diffuse hypokinesis.  Impaired relaxation of left ventricle -Continue volume management with dialysis -Follow-up with cardiology as an outpatient  ESRD -Patient dialyzes Monday Wednesday Friday -Nephrology consulted and appreciated  Anemia of chronic disease -FOBT was positive however does not appear to be actively bleeding. -Previous hospitalist discussed with gastroenterologist, Dr. Silverio Decamp on 03/13/2018, given his recent MI and stent placement, heme positive, would recommend outpatient follow-up for colonoscopy -Hemoglobin currently 7.2 -discussed with nephrology, will give 1u PRBCs today and the next until likely with HD -Continue to monitor CBC  Right renal mass -Incidental finding from abdominal ultrasound on 02/28/2018: Small mass in the lower pole of the right kidney, could reflect complex cyst or solid lesion.  Noncontrast MRI best available imaging option. -Will need to follow-up as an outpatient  Nasal congestion -Continue Claritin, Flonase  Essential hypertension -Stable, continue amlodipine, losartan, metoprolol  Failure to thrive/physical deconditioning  -Likely due to prolonged hospitalization -Patient has progressed with  physical therapy, now recommending home health  DVT Prophylaxis  SCDs  Code Status: Full  Family Communication: none at bedside  Disposition Plan: Admitted. Pending procedure on 03/20/2018.   Consultants Neurology Interventional radiology Nephrology Gastroenterology Inpatient rehab Cardiology  Procedures  Cardiogram Cardiac catheterization Cerebral angiogram  Antibiotics   Anti-infectives (From admission, onward)   Start     Dose/Rate Route Frequency Ordered Stop   03/20/18 0000  ceFAZolin (ANCEF) IVPB 2g/100 mL premix     2 g 200 mL/hr over 30 Minutes Intravenous 60 min pre-op 03/18/18 0112     03/17/18 0600  ceFAZolin (ANCEF) IVPB 2g/100 mL premix  Status:  Discontinued     2 g 200 mL/hr over 30 Minutes Intravenous To Short Stay 03/16/18 0729 03/17/18 0950      Subjective:   Peter [name] seen and examined today.  Patient has no complaints this morning.  Denies current chest pain, shortness breath, abdominal pain, nausea or vomiting, diarrhea constipation, dizziness or headache. Objective:   Vitals:   03/18/18 2142 03/19/18 0625 03/19/18 1046 03/19/18 1120  BP: (!) 185/75 (!) 157/64 (!) 155/57 (!) 164/59  Pulse: 71 69 70 72  Resp: 18 19 16 16   Temp: 98.2 F (36.8 C) 98.7 F (37.1 C) 97.9 F (36.6 C) (!) 97.5 F (36.4 C)  TempSrc: Oral Oral Oral Oral  SpO2: 100% (!) 88% 100% 100%  Weight:  67.1 kg    Height:        Intake/Output Summary (Last 24 hours) at 03/19/2018 1245 Last data filed at 03/19/2018 0926 Gross per 24 hour  Intake 240 ml  Output -  Net 240 ml   Filed Weights   03/18/18 0650 03/18/18 1029 03/19/18 0625  Weight: 69.5 kg 67.2 kg 67.1 kg   Exam  General: Well developed, thin, NAD  HEENT: NCAT, mucous membranes moist.   Cardiovascular: S1 S2 auscultated, RRR, no murmur  Respiratory: Clear to auscultation bilaterally with equal chest rise  Abdomen: Soft, nontender, nondistended, + bowel sounds  Extremities: warm dry without cyanosis  clubbing or edema  Neuro: AAOx3, nonfocal  Psych: Appropriate mood and affect  Data Reviewed: I have personally reviewed following labs and imaging studies  CBC: Recent Labs  Lab 03/14/18 0827 03/16/18 0729 03/17/18 0054 03/18/18 0500 03/19/18 0518  WBC 9.1 11.1* 10.5 11.1* 11.0*  NEUTROABS 4.4  --  6.0  --   --   HGB 9.3* 7.4* 8.3* 7.3* 7.2*  HCT 29.1* 22.9* 25.6* 23.2* 22.6*  MCV 89.5 90.5 91.1 92.1 91.1  PLT 281 266 301 275 786   Basic Metabolic Panel: Recent Labs  Lab 03/13/18 0646 03/16/18 0729 03/17/18 0054 03/18/18 0500 03/19/18 0518  NA 138 132* 135 132* 134*  K 3.8 3.8 4.5 4.5 3.9  CL 100 96* 97* 97* 96*  CO2 23 24 25 24 25   GLUCOSE 109* 140* 114* 124* 130*  BUN 39* 42* 24* 39* 27*  CREATININE 11.08* 12.18* 7.83* 10.84* 8.03*  CALCIUM 8.3* 8.0* 8.2* 8.3* 8.2*  PHOS 4.7* 4.8*  --  4.3 3.8   GFR: Estimated Creatinine Clearance: 8.1 mL/min (A) (by C-G formula based on SCr of 8.03 mg/dL (H)). Liver Function Tests: Recent Labs  Lab 03/13/18 0646 03/16/18 0729 03/18/18 0500 03/19/18 0518  ALBUMIN 2.6* 2.6* 2.9* 2.7*   No results for input(s): LIPASE, AMYLASE in the last 168 hours. No results for input(s): AMMONIA in the  last 168 hours. Coagulation Profile: Recent Labs  Lab 03/17/18 0054  INR 1.2   Cardiac Enzymes: No results for input(s): CKTOTAL, CKMB, CKMBINDEX, TROPONINI in the last 168 hours. BNP (last 3 results) No results for input(s): PROBNP in the last 8760 hours. HbA1C: No results for input(s): HGBA1C in the last 72 hours. CBG: Recent Labs  Lab 03/18/18 1105  GLUCAP 95   Lipid Profile: No results for input(s): CHOL, HDL, LDLCALC, TRIG, CHOLHDL, LDLDIRECT in the last 72 hours. Thyroid Function Tests: No results for input(s): TSH, T4TOTAL, FREET4, T3FREE, THYROIDAB in the last 72 hours. Anemia Panel: No results for input(s): VITAMINB12, FOLATE, FERRITIN, TIBC, IRON, RETICCTPCT in the last 72 hours. Urine analysis: No results  found for: COLORURINE, APPEARANCEUR, LABSPEC, PHURINE, GLUCOSEU, HGBUR, BILIRUBINUR, KETONESUR, PROTEINUR, UROBILINOGEN, NITRITE, LEUKOCYTESUR Sepsis Labs: @LABRCNTIP (procalcitonin:4,lacticidven:4)  )No results found for this or any previous visit (from the past 240 hour(s)).    Radiology Studies: No results found.   Scheduled Meds: . sodium chloride   Intravenous Once  . amLODipine  10 mg Oral QHS  . [START ON 03/20/2018] aspirin EC  325 mg Oral 60 min Pre-Op  . aspirin EC  81 mg Oral Daily  . atorvastatin  40 mg Oral q1800  . Chlorhexidine Gluconate Cloth  6 each Topical Q0600  . [START ON 03/20/2018] darbepoetin (ARANESP) injection - DIALYSIS  100 mcg Intravenous Q Fri-HD  . famotidine  20 mg Oral QHS  . fluticasone  2 spray Each Nare BID  . isosorbide mononitrate  60 mg Oral Daily  . loratadine  10 mg Oral Q48H  . losartan  100 mg Oral Daily  . metoprolol succinate  200 mg Oral Daily  . multivitamin  1 tablet Oral Daily  . [START ON 03/20/2018] niMODipine  0-60 mg Oral 60 min Pre-Op  . polyethylene glycol  17 g Oral Daily  . senna-docusate  1 tablet Oral BID  . ticagrelor  90 mg Oral BID  . [START ON 03/20/2018] ticagrelor  90 mg Oral Once   Continuous Infusions: . [START ON 03/20/2018] sodium chloride    . [START ON 03/20/2018]  ceFAZolin (ANCEF) IV    . ferric gluconate (FERRLECIT/NULECIT) IV 125 mg (03/18/18 0919)     LOS: 19 days   Time Spent in minutes   30 minutes  Peter [name] D.O. on 03/19/2018 at 12:45 PM  Between 7am to 7pm - Please see pager noted on [email or web address]  After 7pm go to [email or web address]  And look for the night coverage person covering for me after hours  [facility] Office  [phone]

## 2018-03-19 NOTE — Progress Notes (Signed)
Physical Therapy Treatment Patient Details Name: Peter [name] MRN: [id number] DOB: 1945-08-25 Today's Date: 03/19/2018    History of Present Illness Pt is a 73 y.o. male admitted 02/27/18 with L-sided weakness while at dialysis. MRI shows small volume diffusion abnormality involving parasagittal cerebral hemispheres, additional L frontal cortical involvement compatible with acute ischemia; remote bilat cerebellar infarcts. Pt with 2x NSTEMIs since admission. S/p PCI of LAD on 2/21. 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 Tentative plan for L ICA stent on 03/17/18. PMH includes HTN, ESRD on HD.    PT Comments    Pt admitted with above diagnosis. Pt currently with functional limitations due to the deficits listed below (see PT Problem List). Pt was able to ambulate in hallway. Scored 16/24 on DGI suggesting at risk of falls without device.  Pt did improve from previous DGI.   Met 2/4 goals.  Revised goals today.  Continue PT.  Pt will benefit from skilled PT to increase their independence and safety with mobility to allow discharge to the venue listed below.    Follow Up Recommendations  Home health PT;Supervision - Intermittent     Equipment Recommendations  None recommended by PT    Recommendations for Other Services       Precautions / Restrictions Precautions Precautions: Fall Restrictions Weight Bearing Restrictions: No    Mobility  Bed Mobility Overal bed mobility: Independent Bed Mobility: Supine to Sit     Supine to sit: Modified independent (Device/Increase time)        Transfers Overall transfer level: Modified independent Equipment used: Rolling walker (2 wheeled) Transfers: Sit to/from Stand Sit to Stand: Modified independent (Device/Increase time)         General transfer comment: mod I, no LOB  Ambulation/Gait Ambulation/Gait assistance: Supervision Gait  Distance (Feet): 400 Feet Assistive device: Rolling walker (2 wheeled);1 person hand held assist Gait Pattern/deviations: Step-through pattern;Decreased stride length   Gait velocity interpretation: 1.31 - 2.62 ft/sec, indicative of limited community ambulator General Gait Details: Pt recieving blood but still felt like walking.  Needs Rw for stability.    Stairs             Wheelchair Mobility    Modified Rankin (Stroke Patients Only) Modified Rankin (Stroke Patients Only) Pre-Morbid Rankin Score: No symptoms Modified Rankin: Moderate disability     Balance Overall balance assessment: Needs assistance Sitting-balance support: No upper extremity supported;Feet supported Sitting balance-Leahy Scale: Good     Standing balance support: During functional activity;No upper extremity supported Standing balance-Leahy Scale: Fair Standing balance comment: pt is able to static stand without UE support                  Standardized Balance Assessment Standardized Balance Assessment : Dynamic Gait Index   Dynamic Gait Index Level Surface: Mild Impairment Change in Gait Speed: Mild Impairment Gait with Horizontal Head Turns: Mild Impairment Gait with Vertical Head Turns: Mild Impairment Gait and Pivot Turn: Mild Impairment Step Over Obstacle: Mild Impairment Step Around Obstacles: Mild Impairment Steps: Mild Impairment Total Score: 16      Cognition Arousal/Alertness: Awake/alert Behavior During Therapy: WFL for tasks assessed/performed Overall Cognitive Status: Impaired/Different from baseline Area of Impairment: Problem solving                   Current Attention Level: Selective Memory: Decreased short-term memory Following Commands: Follows multi-step commands with increased time Safety/Judgement: Decreased awareness of  safety Awareness: Emergent Problem Solving: Slow processing;Requires verbal cues        Exercises      General Comments  General comments (skin integrity, edema, etc.): Improved score on DGI however still at risk of falls without device.       Pertinent Vitals/Pain Pain Assessment: No/denies pain    Home Living                      Prior Function            PT Goals (current goals can now be found in the care plan section) Acute Rehab PT Goals PT Goal Formulation: With patient Time For Goal Achievement: 04/02/18 Potential to Achieve Goals: Good Progress towards PT goals: Progressing toward goals    Frequency    Min 4X/week      PT Plan Current plan remains appropriate    Co-evaluation              AM-PAC PT "6 Clicks" Mobility   Outcome Measure  Help needed turning from your back to your side while in a flat bed without using bedrails?: None Help needed moving from lying on your back to sitting on the side of a flat bed without using bedrails?: None Help needed moving to and from a bed to a chair (including a wheelchair)?: None Help needed standing up from a chair using your arms (e.g., wheelchair or bedside chair)?: None Help needed to walk in hospital room?: A Little Help needed climbing 3-5 steps with a railing? : A Little 6 Click Score: 22    End of Session Equipment Utilized During Treatment: Gait belt Activity Tolerance: Patient tolerated treatment well Patient left: with call bell/phone within reach;in bed Nurse Communication: Mobility status PT Visit Diagnosis: Other abnormalities of gait and mobility (R26.89);Muscle weakness (generalized) (M62.81)     Time: 2993-7169 PT Time Calculation (min) (ACUTE ONLY): 15 min  Charges:  $Gait Training: 8-22 mins                     [place] Pager:  [phone]  Office:  [place] 03/19/2018, 12:30 PM

## 2018-03-19 NOTE — Anesthesia Preprocedure Evaluation (Addendum)
Anesthesia Evaluation  Patient identified by MRN, date of birth, ID band Patient awake    Reviewed: Allergy & Precautions, H&P , NPO status , Patient's Chart, lab work & pertinent test results, reviewed documented beta blocker date and time   Airway Mallampati: II  TM Distance: >3 FB Neck ROM: Full    Dental no notable dental hx. (+) Teeth Intact, Dental Advisory Given   Pulmonary neg pulmonary ROS, former smoker,    Pulmonary exam normal breath sounds clear to auscultation       Cardiovascular Exercise Tolerance: Good hypertension, Pt. on medications and Pt. on home beta blockers + CAD, + Past MI, + Cardiac Stents and +CHF   Rhythm:Regular Rate:Normal     Neuro/Psych  Headaches, TIACVA negative psych ROS   GI/Hepatic negative GI ROS, Neg liver ROS,   Endo/Other  diabetes  Renal/GU ESRF and DialysisRenal disease  negative genitourinary   Musculoskeletal   Abdominal   Peds  Hematology  (+) Blood dyscrasia, anemia ,   Anesthesia Other Findings   Reproductive/Obstetrics negative OB ROS                            Anesthesia Physical Anesthesia Plan  ASA: IV  Anesthesia Plan: General   Post-op Pain Management:    Induction: Intravenous  PONV Risk Score and Plan: 3 and Ondansetron, Dexamethasone and Treatment may vary due to age or medical condition  Airway Management Planned: Oral ETT  Additional Equipment: Arterial line  Intra-op Plan:   Post-operative Plan: Extubation in OR  Informed Consent: I have reviewed the patients History and Physical, chart, labs and discussed the procedure including the risks, benefits and alternatives for the proposed anesthesia with the patient or authorized representative who has indicated his/her understanding and acceptance.     Dental advisory given  Plan Discussed with: CRNA  Anesthesia Plan Comments:        Anesthesia Quick  Evaluation

## 2018-03-20 ENCOUNTER — Other Ambulatory Visit (HOSPITAL_COMMUNITY): Payer: Self-pay | Admitting: *Deleted

## 2018-03-20 ENCOUNTER — Encounter (HOSPITAL_COMMUNITY): Payer: Self-pay | Admitting: Certified Registered Nurse Anesthetist

## 2018-03-20 ENCOUNTER — Encounter (HOSPITAL_COMMUNITY)
Admission: EM | Disposition: A | Discharge status: Home Health | Payer: Self-pay | Source: Ambulatory Visit | Attending: Internal Medicine

## 2018-03-20 ENCOUNTER — Inpatient Hospital Stay (HOSPITAL_COMMUNITY): Payer: Medicare HMO | Admitting: Anesthesiology

## 2018-03-20 ENCOUNTER — Other Ambulatory Visit (HOSPITAL_COMMUNITY): Payer: Self-pay

## 2018-03-20 ENCOUNTER — Inpatient Hospital Stay (HOSPITAL_COMMUNITY): Payer: Medicare HMO

## 2018-03-20 DIAGNOSIS — I6522 Occlusion and stenosis of left carotid artery: Secondary | ICD-10-CM | POA: Diagnosis present

## 2018-03-20 HISTORY — PX: RADIOLOGY WITH ANESTHESIA: SHX6223

## 2018-03-20 HISTORY — PX: IR INTRAVSC STENT CERV CAROTID W/EMB-PROT MOD SED INCL ANGIO: IMG2303

## 2018-03-20 LAB — CBC WITH DIFFERENTIAL/PLATELET
Abs Immature Granulocytes: 0.06 10*3/uL (ref 0.00–0.07)
Basophils Absolute: 0.1 10*3/uL (ref 0.0–0.1)
Basophils Relative: 1 %
Eosinophils Absolute: 1.4 10*3/uL — ABNORMAL HIGH (ref 0.0–0.5)
Eosinophils Relative: 13 %
HEMATOCRIT: 31.7 % — AB (ref 39.0–52.0)
Hemoglobin: 10.5 g/dL — ABNORMAL LOW (ref 13.0–17.0)
Immature Granulocytes: 1 %
Lymphocytes Relative: 26 %
Lymphs Abs: 2.9 10*3/uL (ref 0.7–4.0)
MCH: 30.3 pg (ref 26.0–34.0)
MCHC: 33.1 g/dL (ref 30.0–36.0)
MCV: 91.6 fL (ref 80.0–100.0)
Monocytes Absolute: 0.9 10*3/uL (ref 0.1–1.0)
Monocytes Relative: 8 %
Neutro Abs: 5.6 10*3/uL (ref 1.7–7.7)
Neutrophils Relative %: 51 %
Platelets: 278 10*3/uL (ref 150–400)
RBC: 3.46 MIL/uL — ABNORMAL LOW (ref 4.22–5.81)
RDW: 16.4 % — ABNORMAL HIGH (ref 11.5–15.5)
WBC: 11 10*3/uL — ABNORMAL HIGH (ref 4.0–10.5)
nRBC: 0 % (ref 0.0–0.2)

## 2018-03-20 LAB — PLATELET INHIBITION P2Y12: Platelet Function  P2Y12: 229 [PRU] (ref 182–335)

## 2018-03-20 LAB — BASIC METABOLIC PANEL
Anion gap: 16 — ABNORMAL HIGH (ref 5–15)
BUN: 42 mg/dL — ABNORMAL HIGH (ref 8–23)
CO2: 22 mmol/L (ref 22–32)
Calcium: 9.1 mg/dL (ref 8.9–10.3)
Chloride: 95 mmol/L — ABNORMAL LOW (ref 98–111)
Creatinine, Ser: 10.38 mg/dL — ABNORMAL HIGH (ref 0.61–1.24)
GFR calc Af Amer: 5 mL/min — ABNORMAL LOW (ref >60)
GFR calc non Af Amer: 4 mL/min — ABNORMAL LOW (ref >60)
Glucose, Bld: 111 mg/dL — ABNORMAL HIGH (ref 70–99)
Potassium: 4.5 mmol/L (ref 3.5–5.1)
Sodium: 133 mmol/L — ABNORMAL LOW (ref 135–145)

## 2018-03-20 LAB — HEPARIN LEVEL (UNFRACTIONATED): Heparin Unfractionated: <0.1 IU/mL — ABNORMAL LOW (ref 0.30–0.70)

## 2018-03-20 LAB — PREPARE RBC (CROSSMATCH)

## 2018-03-20 LAB — POCT ACTIVATED CLOTTING TIME: ACTIVATED CLOTTING TIME: 180 s

## 2018-03-20 SURGERY — RADIOLOGY WITH ANESTHESIA
Anesthesia: General

## 2018-03-20 MED ORDER — FENTANYL CITRATE (PF) 100 MCG/2ML IJ SOLN
INTRAMUSCULAR | Status: DC | PRN
  Administered 2018-03-20: 25 ug via INTRAVENOUS

## 2018-03-20 MED ORDER — GLYCOPYRROLATE PF 0.2 MG/ML IJ SOSY
PREFILLED_SYRINGE | INTRAMUSCULAR | Status: DC | PRN
  Administered 2018-03-20: .1 mg via INTRAVENOUS

## 2018-03-20 MED ORDER — NITROGLYCERIN 1 MG/10 ML FOR IR/CATH LAB
INTRA_ARTERIAL | Status: AC
  Filled 2018-03-20: qty 10

## 2018-03-20 MED ORDER — LIDOCAINE HCL 1 % IJ SOLN
INTRAMUSCULAR | Status: AC
  Filled 2018-03-20: qty 20

## 2018-03-20 MED ORDER — ACETAMINOPHEN 160 MG/5ML PO SOLN: 650.0000 mg | ORAL | Status: DC | PRN

## 2018-03-20 MED ORDER — HEPARIN (PORCINE) 25000 UT/250ML-% IV SOLN
700.0000 [IU]/h | INTRAVENOUS | Status: DC
  Filled 2018-03-20 (×2): qty 250

## 2018-03-20 MED ORDER — ASPIRIN 81 MG PO CHEW
81.0000 mg | CHEWABLE_TABLET | Freq: Every day | ORAL | Status: DC
  Administered 2018-03-21 – 2018-03-29 (×9): 81 mg via ORAL
  Filled 2018-03-20 (×9): qty 1

## 2018-03-20 MED ORDER — IOHEXOL 300 MG/ML  SOLN
150.0000 mL | Freq: Once | INTRAMUSCULAR | Status: AC | PRN
  Administered 2018-03-20: 60 mL via INTRAVENOUS

## 2018-03-20 MED ORDER — SODIUM CHLORIDE 0.9 % IV SOLN
INTRAVENOUS | Status: DC
  Administered 2018-03-20 – 2018-03-21 (×3): via INTRAVENOUS

## 2018-03-20 MED ORDER — CLEVIDIPINE BUTYRATE 0.5 MG/ML IV EMUL
INTRAVENOUS | Status: AC
  Filled 2018-03-20: qty 50

## 2018-03-20 MED ORDER — TIROFIBAN (AGGRASTAT) 50MCG/ML FOR INTRACORONARY USE
1727.5000 ug | Freq: Once | INTRACORONARY | Status: AC
  Administered 2018-03-20: 442 ug via INTRAVENOUS
  Filled 2018-03-20: qty 1

## 2018-03-20 MED ORDER — TIROFIBAN HCL 3.75 MG/15ML IV CONC
250.0000 ug | Freq: Once | INTRAVENOUS | Status: DC
  Filled 2018-03-20: qty 15

## 2018-03-20 MED ORDER — CLEVIDIPINE BUTYRATE 0.5 MG/ML IV EMUL
INTRAVENOUS | Status: DC | PRN
  Administered 2018-03-20: 1 mg/h via INTRAVENOUS

## 2018-03-20 MED ORDER — ACETAMINOPHEN 500 MG PO TABS
ORAL_TABLET | ORAL | Status: AC
  Filled 2018-03-20: qty 2

## 2018-03-20 MED ORDER — ACETAMINOPHEN 325 MG PO TABS
650.0000 mg | ORAL_TABLET | ORAL | Status: DC | PRN
  Administered 2018-03-28: 650 mg via ORAL
  Filled 2018-03-20: qty 2

## 2018-03-20 MED ORDER — EPTIFIBATIDE 20 MG/10ML IV SOLN
INTRAVENOUS | Status: AC
  Filled 2018-03-20: qty 10

## 2018-03-20 MED ORDER — IOHEXOL 300 MG/ML  SOLN
150.0000 mL | Freq: Once | INTRAMUSCULAR | Status: AC | PRN
  Administered 2018-03-20: 30 mL via INTRAVENOUS

## 2018-03-20 MED ORDER — ACETAMINOPHEN 500 MG PO TABS
1000.0000 mg | ORAL_TABLET | Freq: Once | ORAL | Status: AC
  Administered 2018-03-20: 1000 mg via ORAL
  Filled 2018-03-20: qty 2

## 2018-03-20 MED ORDER — SODIUM CHLORIDE 0.9 % IV SOLN
INTRAVENOUS | Status: DC
  Administered 2018-03-20 (×2): via INTRAVENOUS

## 2018-03-20 MED ORDER — ACETAMINOPHEN 650 MG RE SUPP: 650.0000 mg | RECTAL | Status: DC | PRN

## 2018-03-20 MED ORDER — MIDAZOLAM HCL 5 MG/5ML IJ SOLN
INTRAMUSCULAR | Status: DC | PRN
  Administered 2018-03-20: 1 mg via INTRAVENOUS

## 2018-03-20 MED ORDER — TICAGRELOR 90 MG PO TABS
90.0000 mg | ORAL_TABLET | Freq: Two times a day (BID) | ORAL | Status: DC
  Administered 2018-03-20 – 2018-03-21 (×2): 90 mg via ORAL
  Filled 2018-03-20 (×2): qty 1

## 2018-03-20 MED ORDER — ONDANSETRON HCL 4 MG/2ML IJ SOLN
INTRAMUSCULAR | Status: DC | PRN
  Administered 2018-03-20: 4 mg via INTRAVENOUS

## 2018-03-20 MED ORDER — TIROFIBAN (AGGRASTAT) BOLUS VIA INFUSION: 25.0000 ug/kg | Freq: Once | INTRAVENOUS | Status: DC

## 2018-03-20 MED ORDER — TICAGRELOR 90 MG PO TABS: 90.0000 mg | ORAL_TABLET | Freq: Two times a day (BID) | ORAL | Status: DC

## 2018-03-20 MED ORDER — HEPARIN (PORCINE) 25000 UT/250ML-% IV SOLN: 500.0000 [IU]/h | INTRAVENOUS | Status: DC

## 2018-03-20 MED ORDER — MIDAZOLAM HCL 2 MG/2ML IJ SOLN
INTRAMUSCULAR | Status: AC
  Filled 2018-03-20: qty 2

## 2018-03-20 MED ORDER — FENTANYL CITRATE (PF) 100 MCG/2ML IJ SOLN
INTRAMUSCULAR | Status: AC
  Filled 2018-03-20: qty 2

## 2018-03-20 MED ORDER — HEPARIN SODIUM (PORCINE) 1000 UNIT/ML IJ SOLN
INTRAMUSCULAR | Status: DC | PRN
  Administered 2018-03-20: 3000 [IU] via INTRAVENOUS
  Administered 2018-03-20: 500 [IU] via INTRAVENOUS

## 2018-03-20 MED ORDER — HEPARIN (PORCINE) 25000 UT/250ML-% IV SOLN
INTRAVENOUS | Status: AC
  Filled 2018-03-20: qty 250

## 2018-03-20 MED ORDER — LIDOCAINE HCL (PF) 1 % IJ SOLN
INTRAMUSCULAR | Status: DC | PRN
  Administered 2018-03-20: 5 mL

## 2018-03-20 MED ORDER — ASPIRIN 81 MG PO CHEW: 81.0000 mg | CHEWABLE_TABLET | Freq: Every day | ORAL | Status: DC

## 2018-03-20 MED ORDER — SODIUM CHLORIDE 0.9% IV SOLUTION: Freq: Once | INTRAVENOUS | Status: DC

## 2018-03-20 MED ORDER — HEPARIN (PORCINE) 25000 UT/250ML-% IV SOLN
700.0000 [IU]/h | INTRAVENOUS | Status: DC
  Administered 2018-03-20: 700 [IU]/h via INTRAVENOUS
  Filled 2018-03-20: qty 250

## 2018-03-20 MED ORDER — CLEVIDIPINE BUTYRATE 0.5 MG/ML IV EMUL
0.0000 mg/h | INTRAVENOUS | Status: AC
  Administered 2018-03-20: 16 mg/h via INTRAVENOUS
  Administered 2018-03-20: 15 mg/h via INTRAVENOUS
  Administered 2018-03-20: 17 mg/h via INTRAVENOUS
  Administered 2018-03-20: 16 mg/h via INTRAVENOUS
  Administered 2018-03-20: 24 mg/h via INTRAVENOUS
  Administered 2018-03-20: 6 mg/h via INTRAVENOUS
  Administered 2018-03-21: 20 mg/h via INTRAVENOUS
  Filled 2018-03-20 (×7): qty 50

## 2018-03-20 NOTE — Progress Notes (Signed)
Bedside report given to [facility], PACU. Right groin checked, level 0, dressing c/d/i.

## 2018-03-20 NOTE — Progress Notes (Signed)
IR tech holding pressure to right groin.

## 2018-03-20 NOTE — Progress Notes (Signed)
Soft c-colar placed on patients neck by IR staff.

## 2018-03-20 NOTE — Procedures (Signed)
S/P stent assisted  angioplasty of high grade stenosis of prox Lt ICA with distal protection.

## 2018-03-20 NOTE — Progress Notes (Signed)
PROGRESS NOTE    Peter [name]  SVX:[id number] DOB: 09/11/1945 DOA: 02/27/2018 PCP: Patient, No Pcp Per   Brief Narrative:  HPI On 02/27/2018 by Dr. Shela Leff Peter [name] is a 73 y.o. male with a past medical history of ESRD on HD, hypertension being brought to the hospital from his dialysis center due to strokelike symptoms with left sided weakness. Last known normal 3:15 this afternoon.  Patient states after finishing dialysis today he felt weak and could not get out of the chair.  Both of his legs felt weak and his right leg was shaking.  He could not lift his arms.  States he was told that his left arm was not moving.  Denies any history of prior stroke.  He is a former smoker; quit 4 years ago.  He was previously smoking 1/2 pack/day for about 20 years.  He is complaining of a headache.  Denies having any chest pain.  No additional history could be obtained from the patient.  Interim history Patient presented to the hospital with left-sided weakness after dialysis on 02/27/2018 and was found to have an acute stroke, neurology consulted.  Also noted to have an end STEMI on 03/04/2018 with chest pain.  Status post DES.  Pending stent placement ACA which will be done on 03/20/2018 at 8 AM by interventional radiology. Assessment & Plan   Acute CVA -Embolic, unknown source -Patient presented with left-sided weakness after dialysis he did not receive TPA due to mild deficits -Neurology consulted and appreciated -Cerebral arteriogram showed 90% stenosis of the proximal left ICA.  Left ICA stent will be placed on 03/20/2018 by interventional radiology. -Echocardiogram as listed below -Continue aspirin, brilinta, statin  Left ICA stenosis -IR consulted, planning for procedure today 03/20/2018 -Dr. Estanislado Pandy recommending discontinuing Plavix, and starting Brilinta and has been following P2Y12   NSTEMI -Patient did have chest pain the night of 03/03/2018 03/04/2018, with a peak troponin of  6.4 -Cardiology consulted and appreciated -Status post cardiac catheterization on 03/06/2018 with PCI/DES x1 to the LAD with scoring balloon to ISR in the ostium of OM1.  Continue to treat RCA disease medically. -Patient will need DAPT with aspirin and Plavix for at least 1 year once done with Brilinta as per interventional radiology recommendation -Cardiology recommended 30-day event monitor with TEE/possible ICD evaluation on outpatient basis through his primary cardiologist, Dr. Beatrix Fetters -Of note, TEE was attempted but aborted on 03/03/2018, as patient was unable to be sedated adequately -No further complaints of chest pain  Chronic systolic congestive heart failure -Echocardiogram on 02/28/2018 showed an EF of 30 to 35%, cavity size mildly dilated.  Diffuse hypokinesis.  Impaired relaxation of left ventricle -Continue volume management with dialysis -Follow-up with cardiology as an outpatient  ESRD -Patient dialyzes Monday Wednesday Friday -Nephrology consulted and appreciated -patient due to HD today- however may occur on 3/7- pending nephrology recommendations and vascular procedure  Anemia of chronic disease -FOBT was positive however does not appear to be actively bleeding. -Previous hospitalist discussed with gastroenterologist, Dr. Silverio Decamp on 03/13/2018, given his recent MI and stent placement, heme positive, would recommend outpatient follow-up for colonoscopy -Hemoglobin dropped to 732, given 1u PRBC on 03/19/2018 -Hemoglobin today 10.5 -Patient may need additional unit- however would hold off until next HD session given that patient feels "bloated" today and have CHF- would not want to volume overload him prior to surgery -Continue to monitor CBC  Right renal mass -Incidental finding from abdominal ultrasound on 02/28/2018: Small mass in the  lower pole of the right kidney, could reflect complex cyst or solid lesion.  Noncontrast MRI best available imaging option. -Will need to  follow-up as an outpatient  Nasal congestion -Continue Claritin, Flonase  Essential hypertension -Stable, continue amlodipine, losartan, metoprolol  Failure to thrive/physical deconditioning  -Likely due to prolonged hospitalization -Patient has progressed with physical therapy, now recommending home health  DVT Prophylaxis  SCDs  Code Status: Full  Family Communication: Daughter at bedside  Disposition Plan: Admitted. Pending procedure today 03/20/2018. Dispo TBD  Consultants Neurology Interventional radiology Nephrology Gastroenterology Inpatient rehab Cardiology  Procedures  Cardiogram Cardiac catheterization Cerebral angiogram  Antibiotics   Anti-infectives (From admission, onward)   Start     Dose/Rate Route Frequency Ordered Stop   03/20/18 0000  ceFAZolin (ANCEF) IVPB 2g/100 mL premix     2 g 200 mL/hr over 30 Minutes Intravenous 60 min pre-op 03/18/18 0112     03/17/18 0600  ceFAZolin (ANCEF) IVPB 2g/100 mL premix  Status:  Discontinued     2 g 200 mL/hr over 30 Minutes Intravenous To Short Stay 03/16/18 0729 03/17/18 0950      Subjective:   Peter [name] seen and examined today.  Complaining of feeling bloating this morning.  Currently denies chest pain, shortness of breath, abdominal pain, nausea or vomiting, diarrhea or constipation, dizziness or headache.  States he did have a bowel movement.  Upset this morning, as his procedure has been pushed back. Objective:   Vitals:   03/20/18 0020 03/20/18 0550 03/20/18 0559 03/20/18 0747  BP: (!) 167/67 (!) 174/63  (!) 173/63  Pulse: 65 67  71  Resp: 20 20  20   Temp: 98.1 F (36.7 C) 98.9 F (37.2 C)  98.1 F (36.7 C)  TempSrc: Oral   Oral  SpO2: 97% 99%  99%  Weight:   69.1 kg   Height:        Intake/Output Summary (Last 24 hours) at 03/20/2018 0956 Last data filed at 03/19/2018 1557 Gross per 24 hour  Intake 724 ml  Output -  Net 724 ml   Filed Weights   03/18/18 1029 03/19/18 0625 03/20/18 0559   Weight: 67.2 kg 67.1 kg 69.1 kg   Exam  General: Well developed, thin, NAD  HEENT: NCAT, mucous membranes moist.   Cardiovascular: S1 S2 auscultated, RRR  Respiratory: Clear to auscultation bilaterally   Abdomen: Soft, nontender, nondistended, + bowel sounds  Extremities: warm dry without cyanosis clubbing or edema  Neuro: AAOx3, nonfocal  Psych: Appropriate mood and affect  Data Reviewed: I have personally reviewed following labs and imaging studies  CBC: Recent Labs  Lab 03/14/18 0827 03/16/18 0729 03/17/18 0054 03/18/18 0500 03/19/18 0518 03/20/18 0611  WBC 9.1 11.1* 10.5 11.1* 11.0* 11.0*  NEUTROABS 4.4  --  6.0  --   --  5.6  HGB 9.3* 7.4* 8.3* 7.3* 7.2* 10.5*  HCT 29.1* 22.9* 25.6* 23.2* 22.6* 31.7*  MCV 89.5 90.5 91.1 92.1 91.1 91.6  PLT 281 266 301 275 273 110   Basic Metabolic Panel: Recent Labs  Lab 03/16/18 0729 03/17/18 0054 03/18/18 0500 03/19/18 0518 03/20/18 0611  NA 132* 135 132* 134* 133*  K 3.8 4.5 4.5 3.9 4.5  CL 96* 97* 97* 96* 95*  CO2 24 25 24 25 22   GLUCOSE 140* 114* 124* 130* 111*  BUN 42* 24* 39* 27* 42*  CREATININE 12.18* 7.83* 10.84* 8.03* 10.38*  CALCIUM 8.0* 8.2* 8.3* 8.2* 9.1  PHOS 4.8*  --  4.3 3.8  --  GFR: Estimated Creatinine Clearance: 6.5 mL/min (A) (by C-G formula based on SCr of 10.38 mg/dL (H)). Liver Function Tests: Recent Labs  Lab 03/16/18 0729 03/18/18 0500 03/19/18 0518  ALBUMIN 2.6* 2.9* 2.7*   No results for input(s): LIPASE, AMYLASE in the last 168 hours. No results for input(s): AMMONIA in the last 168 hours. Coagulation Profile: Recent Labs  Lab 03/17/18 0054  INR 1.2   Cardiac Enzymes: No results for input(s): CKTOTAL, CKMB, CKMBINDEX, TROPONINI in the last 168 hours. BNP (last 3 results) No results for input(s): PROBNP in the last 8760 hours. HbA1C: No results for input(s): HGBA1C in the last 72 hours. CBG: Recent Labs  Lab 03/18/18 1105  GLUCAP 95   Lipid Profile: No results  for input(s): CHOL, HDL, LDLCALC, TRIG, CHOLHDL, LDLDIRECT in the last 72 hours. Thyroid Function Tests: No results for input(s): TSH, T4TOTAL, FREET4, T3FREE, THYROIDAB in the last 72 hours. Anemia Panel: No results for input(s): VITAMINB12, FOLATE, FERRITIN, TIBC, IRON, RETICCTPCT in the last 72 hours. Urine analysis: No results found for: COLORURINE, APPEARANCEUR, LABSPEC, PHURINE, GLUCOSEU, HGBUR, BILIRUBINUR, KETONESUR, PROTEINUR, UROBILINOGEN, NITRITE, LEUKOCYTESUR Sepsis Labs: @LABRCNTIP (procalcitonin:4,lacticidven:4)  )No results found for this or any previous visit (from the past 240 hour(s)).    Radiology Studies: No results found.   Scheduled Meds: . sodium chloride   Intravenous Once  . sodium chloride   Intravenous Once  . amLODipine  10 mg Oral QHS  . aspirin EC  325 mg Oral 60 min Pre-Op  . aspirin EC  81 mg Oral Daily  . atorvastatin  40 mg Oral q1800  . Chlorhexidine Gluconate Cloth  6 each Topical Q0600  . darbepoetin (ARANESP) injection - DIALYSIS  100 mcg Intravenous Q Fri-HD  . famotidine  20 mg Oral QHS  . fluticasone  2 spray Each Nare BID  . isosorbide mononitrate  60 mg Oral Daily  . loratadine  10 mg Oral Q48H  . losartan  100 mg Oral Daily  . metoprolol succinate  200 mg Oral Daily  . multivitamin  1 tablet Oral Daily  . niMODipine  0-60 mg Oral 60 min Pre-Op  . polyethylene glycol  17 g Oral Daily  . senna-docusate  1 tablet Oral BID  . ticagrelor  90 mg Oral BID   Continuous Infusions: . sodium chloride 75 mL/hr at 03/20/18 0620  .  ceFAZolin (ANCEF) IV    . ferric gluconate (FERRLECIT/NULECIT) IV 125 mg (03/18/18 0919)     LOS: 20 days   Time Spent in minutes   30 minutes  Onie Hayashi D.O. on 03/20/2018 at 9:56 AM  Between 7am to 7pm - Please see pager noted on [email or web address]  After 7pm go to [email or web address]  And look for the night coverage person covering for me after hours  [facility] Office  [phone]

## 2018-03-20 NOTE — Anesthesia Procedure Notes (Signed)
Procedure Name: MAC Date/Time: 03/20/2018 11:44 AM Performed by: Candis Shine, CRNA Pre-anesthesia Checklist: Patient identified, Emergency Drugs available, Suction available, Patient being monitored and Timeout performed Patient Re-evaluated:Patient Re-evaluated prior to induction Oxygen Delivery Method: Nasal cannula Dental Injury: Teeth and Oropharynx as per pre-operative assessment

## 2018-03-20 NOTE — Anesthesia Postprocedure Evaluation (Signed)
Anesthesia Post Note  Patient: Peter [name]  Procedure(s) Performed: RADIOLOGY WITH ANESTHESIA STENTING (N/A )     Patient location during evaluation: PACU Anesthesia Type: General Level of consciousness: awake and alert Pain management: pain level controlled Vital Signs Assessment: post-procedure vital signs reviewed and stable Respiratory status: spontaneous breathing, nonlabored ventilation and respiratory function stable Cardiovascular status: stable and blood pressure returned to baseline Postop Assessment: no apparent nausea or vomiting Anesthetic complications: no    Last Vitals:  Vitals:   03/20/18 1425 03/20/18 1500  BP: (!) 146/51 (!) 119/53  Pulse: 78   Resp: 19 18  Temp:    SpO2: 98%     Last Pain:  Vitals:   03/20/18 1355  TempSrc:   PainSc: 0-No pain                 Ruchama Kubicek,W. EDMOND

## 2018-03-20 NOTE — Progress Notes (Signed)
[facility] Progress Note   [facility]  MWF 3.5h R IJ TDC 149lbs (67.5kg)  Hep 2700 then 500u/hr - hect 1.5 ug IV tiw - ferrlicit - no esa  Home meds: - amlodipine 5 hs/ losartan 100/ metoprolol xl 200/ furosemide 40 bid - aspirin 325 qd/ atorvastatin 40/ sl ntg prn - famotidine 20 hs/ vitamins/ prn's  Assessment/Plan: 1. 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 LICA stent procedure planned for today 03/20/18.  2. NSTEMI- s/p PCI of LAD on 03/06/18 on asa/ticagrelor 3. [place] HD qMWF. Will plan HD later today when pt stable after procedure. Pt feels "swollen", has been getting IVF's will dc. Not sure what perm access situation is, has TDC. Not CKA patient.  4. Anemia of ckd/ blood draws/ guiac+: Hb 13 >> 7.9 here, was not getting esa at OP unit; got darbe 60 ug here 2/23 and 2/28, next dose 100ug due today and q Friday. tsat 22% 2/24 - getting 500mg  load of Fe here.  Got 1u prbc yest and Hb up at 10 this am.  5. Anemia/ Guiac+ - not active bleed, seen by GI 2/28, no intervention now given recent MI/ stent 6. CKD-MBD:stable 7. Nutrition:renal diet 8. HTN: on losartan 100/ metop xl 200 qd/ amlodipine 5 > 10/qd 9. Disposition-evaluating for CIR vs SNF   [facility] 03/20/2018, 10:42 AM    Subjective:   Feeling swollen, getting NS at 75 cc/hr, got one unit prbc last night, no SOB, Hb up 7 > 10 after 1u ??.   Objective Vitals:   03/20/18 0020 03/20/18 0550 03/20/18 0559 03/20/18 0747  BP: (!) 167/67 (!) 174/63  (!) 173/63  Pulse: 65 67  71  Resp: 20 20  20   Temp: 98.1 F (36.7 C) 98.9 F (37.2 C)  98.1 F (36.7 C)  TempSrc: Oral   Oral  SpO2: 97% 99%  99%  Weight:   69.1 kg   Height:       Physical Exam General: NAD sitting in bed Heart: RRR Lungs: no rales Abdomen: soft NT ND + BS Extremities: no LE edema, no UE edema Dialysis Access:  Right IJ [facility]   Additional  Objective Labs: Basic Metabolic Panel: Recent Labs  Lab 03/16/18 0729  03/18/18 0500 03/19/18 0518 03/20/18 0611  NA 132*   < > 132* 134* 133*  K 3.8   < > 4.5 3.9 4.5  CL 96*   < > 97* 96* 95*  CO2 24   < > 24 25 22   GLUCOSE 140*   < > 124* 130* 111*  BUN 42*   < > 39* 27* 42*  CREATININE 12.18*   < > 10.84* 8.03* 10.38*  CALCIUM 8.0*   < > 8.3* 8.2* 9.1  PHOS 4.8*  --  4.3 3.8  --    < > = values in this interval not displayed.   Liver Function Tests: Recent Labs  Lab 03/16/18 0729 03/18/18 0500 03/19/18 0518  ALBUMIN 2.6* 2.9* 2.7*   No results for input(s): LIPASE, AMYLASE in the last 168 hours. CBC: Recent Labs  Lab 03/14/18 0827 03/16/18 0729 03/17/18 0054 03/18/18 0500 03/19/18 0518 03/20/18 0611  WBC 9.1 11.1* 10.5 11.1* 11.0* 11.0*  NEUTROABS 4.4  --  6.0  --   --  5.6  HGB 9.3* 7.4* 8.3* 7.3* 7.2* 10.5*  HCT 29.1* 22.9* 25.6* 23.2* 22.6* 31.7*  MCV 89.5 90.5 91.1 92.1 91.1  91.6  PLT 281 266 301 275 273 278   Blood Culture No results found for: SDES, SPECREQUEST, CULT, REPTSTATUS  Cardiac Enzymes: No results for input(s): CKTOTAL, CKMB, CKMBINDEX, TROPONINI in the last 168 hours. CBG: Recent Labs  Lab 03/18/18 1105  GLUCAP 95   Iron Studies: No results for input(s): IRON, TIBC, TRANSFERRIN, FERRITIN in the last 72 hours. Lab Results  Component Value Date   INR 1.2 03/17/2018   INR 1.1 03/09/2018   INR 1.08 02/27/2018   Studies/Results: No results found. Medications: . sodium chloride 75 mL/hr at 03/20/18 0620  .  ceFAZolin (ANCEF) IV    . [MAR Hold] ferric gluconate (FERRLECIT/NULECIT) IV 125 mg (03/18/18 0919)   . [MAR Hold] sodium chloride   Intravenous Once  . [MAR Hold] sodium chloride   Intravenous Once  . [MAR Hold] amLODipine  10 mg Oral QHS  . aspirin EC  325 mg Oral 60 min Pre-Op  . [MAR Hold] aspirin EC  81 mg Oral Daily  . [MAR Hold] atorvastatin  40 mg Oral q1800  . [MAR Hold] Chlorhexidine Gluconate Cloth  6 each  Topical Q0600  . [MAR Hold] darbepoetin (ARANESP) injection - DIALYSIS  100 mcg Intravenous Q Fri-HD  . [MAR Hold] famotidine  20 mg Oral QHS  . [MAR Hold] fluticasone  2 spray Each Nare BID  . [MAR Hold] isosorbide mononitrate  60 mg Oral Daily  . lidocaine      . [MAR Hold] loratadine  10 mg Oral Q48H  . [MAR Hold] losartan  100 mg Oral Daily  . [MAR Hold] metoprolol succinate  200 mg Oral Daily  . [MAR Hold] multivitamin  1 tablet Oral Daily  . niMODipine  0-60 mg Oral 60 min Pre-Op  . [MAR Hold] polyethylene glycol  17 g Oral Daily  . [MAR Hold] senna-docusate  1 tablet Oral BID  . [MAR Hold] ticagrelor  90 mg Oral BID

## 2018-03-20 NOTE — Progress Notes (Signed)
Report given to Tina, RN.  

## 2018-03-20 NOTE — Progress Notes (Signed)
Bedrest to start at 1330.

## 2018-03-20 NOTE — Progress Notes (Signed)
OT Cancellation Note  Patient Details Name: Peter [name] MRN: [id number] DOB: 1945-04-14   Cancelled Treatment:    Reason Eval/Treat Not Completed: Patient not medically ready(pt had PCI procedure today OT to follow-up at a later date)   Ebony Hail [facility]) [facility] OTR/L [place] Pager: [phone] Office: [phone]   Fredda Hammed 03/20/2018, 3:54 PM

## 2018-03-20 NOTE — Progress Notes (Signed)
ANTICOAGULATION CONSULT NOTE - Initial Consult  Pharmacy Consult for heparin Indication: s/p stent assisted angioplasty of prox left ICA  Allergies  Allergen Reactions  . Metformin And Related Other (See Comments)    Contraindicated due to renal failure.  Renal failure-on peritoneal dialysis    . Pioglitazone Other (See Comments)    History of congestive heart failure.    . Propoxyphene Rash    Patient Measurements: Height: 6\' 3"  (190.5 cm) Weight: 152 lb 4.8 oz (69.1 kg) IBW/kg (Calculated) : 84.5  Vital Signs: Temp: 98.1 F (36.7 C) (03/06 0747) Temp Source: Oral (03/06 0747) BP: 146/51 (03/06 1425) Pulse Rate: 78 (03/06 1425)  Labs: Recent Labs    03/18/18 0500 03/19/18 0518 03/20/18 0611  HGB 7.3* 7.2* 10.5*  HCT 23.2* 22.6* 31.7*  PLT 275 273 278  CREATININE 10.84* 8.03* 10.38*    Estimated Creatinine Clearance: 6.5 mL/min (A) (by C-G formula based on SCr of 10.38 mg/dL (H)).   Medical History: Past Medical History:  Diagnosis Date  . Anemia   . CAD (coronary artery disease)   . CHF (congestive heart failure) ([place])   . Diabetes ([place])   . ESRD (end stage renal disease) on dialysis ([place])   . Headache   . HTN (hypertension)   . PEA (Pulseless electrical activity) ([place]) 11/2017  . Traumatic brain injury [facility])     Medications:  Medications Prior to Admission  Medication Sig Dispense Refill Last Dose  . acetaminophen (TYLENOL) 500 MG tablet Take 1,500 mg by mouth daily as needed (pain).    week ago  . amLODipine (NORVASC) 5 MG tablet Take 5 mg by mouth at bedtime.   02/26/2018 at pm  . aspirin EC 325 MG tablet Take 325 mg by mouth daily.   02/26/2018 at am  . atorvastatin (LIPITOR) 40 MG tablet Take 40 mg by mouth daily at 6 PM.   02/26/2018 at pm  . Calcium Carbonate Antacid (TUMS PO) Take 1-3 mg by mouth See admin instructions. Take 3 tablets by mouth up to three times daily with full meals, take 1 tablet with snacks/small meals   02/26/2018 at pm  .  famotidine (PEPCID) 20 MG tablet Take 20 mg by mouth at bedtime.   02/26/2018 at pm  . furosemide (LASIX) 40 MG tablet Take 40 mg by mouth 2 (two) times daily.   02/26/2018 at pm  . isosorbide mononitrate (IMDUR) 60 MG 24 hr tablet Take 60 mg by mouth daily.   02/26/2018 at am  . losartan (COZAAR) 100 MG tablet Take 100 mg by mouth daily.   02/26/2018 at am  . metoprolol (TOPROL-XL) 200 MG 24 hr tablet Take 200 mg by mouth daily.   02/26/2018 at 1000  . multivitamin (RENA-VIT) TABS tablet Take 1 tablet by mouth daily.   02/26/2018 at am  . nitroGLYCERIN (NITROSTAT) 0.4 MG SL tablet Place 0.4 mg under the tongue every 5 (five) minutes as needed for chest pain.    never taken  . polyvinyl alcohol (ARTIFICIAL TEARS) 1.4 % ophthalmic solution Place 1 drop into both eyes daily as needed for dry eyes.   1-2 weeks ago    Assessment: 73 y/o male with ESRD admitted 2/14 with acute stroke and subsequent NSTEMI on 2/19 and is s/p cath with DES to LAD. He was found to have severe stenosis left proximal ICA and is s/p stent assisted angioplasty of proximal left ICA today. Pharmacy consulted to adjust heparin post-op. No overt bleeding noted, Hgb dropped  to 7s, 1 unit PRBCs given on 3/5 and Hgb rose to 10.5 this am, platelets are normal.  Goal of Therapy:  Heparin level 0.2-0.5 units/ml Monitor platelets by anticoagulation protocol: Yes   Plan:  Increase heparin drip to 700 units/hr 8 hr heparin level Monitor for s/sx of bleeding   Renold Genta, PharmD, BCPS Clinical Pharmacist Clinical phone for 03/20/2018 until 10p is [phone] 03/20/2018 2:41 PM  **Pharmacist phone directory can now be found on [email or web address] listed under [place]**

## 2018-03-20 NOTE — Transfer of Care (Signed)
Immediate Anesthesia Transfer of Care Note  Patient: Peter [name]  Procedure(s) Performed: RADIOLOGY WITH ANESTHESIA STENTING (N/A )  Patient Location: PACU  Anesthesia Type:MAC  Level of Consciousness: awake, alert  and oriented  Airway & Oxygen Therapy: Patient Spontanous Breathing  Post-op Assessment: Report given to RN and Post -op Vital signs reviewed and stable  Post vital signs: Reviewed and stable  Last Vitals:  Vitals Value Taken Time  BP 140/89 03/20/2018  1:56 PM  Temp    Pulse    Resp 18 03/20/2018  1:59 PM  SpO2    Vitals shown include unvalidated device data.  Last Pain:  Vitals:   03/20/18 1000  TempSrc:   PainSc: 0-No pain      Patients Stated Pain Goal: 0 (17/71/16 5790)  Complications: No apparent anesthesia complications

## 2018-03-20 NOTE — Progress Notes (Signed)
Patient ID: Peter [name], male   DOB: [birth year], 73 y.o.   MRN: [id number] INR  Post procedure. Neurologically and hemodynamically stable. Denies any H/As N/V,visual or motor symptoms. Sppech clear.Moves all 4s equally  No drift. Tongue midline. RT groin soft. No hematoma. Distal pulses dopplerable DPs and PTs. S.Debveshwar MD

## 2018-03-20 NOTE — Progress Notes (Signed)
SLP Cancellation Note  Patient Details Name: Chevis Weisensel MRN: [id number] DOB: 10-Nov-1945   Cancelled treatment:       Reason Eval/Treat Not Completed: Patient at procedure or test/unavailable   Venita Sheffield Michelene [place]iston 03/20/2018, 11:51 AM  Pollyann Glen, M.A. [place] [facility] Environmental education officer [phone] Office [phone]

## 2018-03-20 NOTE — Anesthesia Procedure Notes (Signed)
Arterial Line Insertion Start/End3/06/2018 11:25 AM Performed by: Candis Shine, CRNA, CRNA  Patient location: Pre-op. Preanesthetic checklist: patient identified, IV checked, site marked, risks and benefits discussed, surgical consent, monitors and equipment checked, pre-op evaluation, timeout performed and anesthesia consent Lidocaine 1% used for infiltration Left, radial was placed Catheter size: 20 G Hand hygiene performed  and maximum sterile barriers used   Attempts: 1 Procedure performed without using ultrasound guided technique. Following insertion, dressing applied and Biopatch. Post procedure assessment: normal and unchanged  Patient tolerated the procedure well with no immediate complications.

## 2018-03-21 DIAGNOSIS — D696 Thrombocytopenia, unspecified: Secondary | ICD-10-CM

## 2018-03-21 LAB — CBC
HCT: 23.7 % — ABNORMAL LOW (ref 39.0–52.0)
Hemoglobin: 8.2 g/dL — ABNORMAL LOW (ref 13.0–17.0)
MCH: 32 pg (ref 26.0–34.0)
MCHC: 34.6 g/dL (ref 30.0–36.0)
MCV: 92.6 fL (ref 80.0–100.0)
Platelets: 49 10*3/uL — ABNORMAL LOW (ref 150–400)
RBC: 2.56 MIL/uL — ABNORMAL LOW (ref 4.22–5.81)
RDW: 16.8 % — ABNORMAL HIGH (ref 11.5–15.5)
WBC: 8.1 10*3/uL (ref 4.0–10.5)
nRBC: 0 % (ref 0.0–0.2)

## 2018-03-21 LAB — CBC WITH DIFFERENTIAL/PLATELET
Abs Immature Granulocytes: 0.05 10*3/uL (ref 0.00–0.07)
BASOS ABS: 0.1 10*3/uL (ref 0.0–0.1)
Basophils Relative: 1 %
Eosinophils Absolute: 0.8 10*3/uL — ABNORMAL HIGH (ref 0.0–0.5)
Eosinophils Relative: 9 %
HCT: 22.8 % — ABNORMAL LOW (ref 39.0–52.0)
Hemoglobin: 7.8 g/dL — ABNORMAL LOW (ref 13.0–17.0)
Immature Granulocytes: 1 %
Lymphocytes Relative: 19 %
Lymphs Abs: 1.8 10*3/uL (ref 0.7–4.0)
MCH: 31.5 pg (ref 26.0–34.0)
MCHC: 34.2 g/dL (ref 30.0–36.0)
MCV: 91.9 fL (ref 80.0–100.0)
Monocytes Absolute: 0.6 10*3/uL (ref 0.1–1.0)
Monocytes Relative: 6 %
NRBC: 0 % (ref 0.0–0.2)
Neutro Abs: 6.1 10*3/uL (ref 1.7–7.7)
Neutrophils Relative %: 64 %
PLATELETS: 46 10*3/uL — AB (ref 150–400)
RBC: 2.48 MIL/uL — AB (ref 4.22–5.81)
RDW: 16.5 % — ABNORMAL HIGH (ref 11.5–15.5)
WBC: 9.5 10*3/uL (ref 4.0–10.5)

## 2018-03-21 LAB — HEPARIN LEVEL (UNFRACTIONATED): Heparin Unfractionated: <0.1 IU/mL — ABNORMAL LOW (ref 0.30–0.70)

## 2018-03-21 LAB — RENAL FUNCTION PANEL
Albumin: 2.7 g/dL — ABNORMAL LOW (ref 3.5–5.0)
Anion gap: 18 — ABNORMAL HIGH (ref 5–15)
BUN: 52 mg/dL — ABNORMAL HIGH (ref 8–23)
CO2: 17 mmol/L — ABNORMAL LOW (ref 22–32)
Calcium: 8 mg/dL — ABNORMAL LOW (ref 8.9–10.3)
Chloride: 97 mmol/L — ABNORMAL LOW (ref 98–111)
Creatinine, Ser: 11.86 mg/dL — ABNORMAL HIGH (ref 0.61–1.24)
GFR calc non Af Amer: 4 mL/min — ABNORMAL LOW (ref >60)
GFR, EST AFRICAN AMERICAN: 4 mL/min — AB (ref >60)
Glucose, Bld: 135 mg/dL — ABNORMAL HIGH (ref 70–99)
PHOSPHORUS: 5.2 mg/dL — AB (ref 2.5–4.6)
Potassium: 4.9 mmol/L (ref 3.5–5.1)
Sodium: 132 mmol/L — ABNORMAL LOW (ref 135–145)

## 2018-03-21 LAB — PREPARE RBC (CROSSMATCH)

## 2018-03-21 MED ORDER — HEPARIN (PORCINE) 25000 UT/250ML-% IV SOLN
850.0000 [IU]/h | INTRAVENOUS | Status: DC
  Administered 2018-03-21: 850 [IU]/h via INTRAVENOUS

## 2018-03-21 MED ORDER — HYDRALAZINE HCL 20 MG/ML IJ SOLN
5.0000 mg | Freq: Four times a day (QID) | INTRAMUSCULAR | Status: DC | PRN
  Administered 2018-03-22: 5 mg via INTRAVENOUS
  Filled 2018-03-21: qty 1

## 2018-03-21 MED ORDER — ONDANSETRON HCL 4 MG PO TABS
4.0000 mg | ORAL_TABLET | Freq: Once | ORAL | Status: AC
  Administered 2018-03-21: 4 mg via ORAL
  Filled 2018-03-21: qty 1

## 2018-03-21 MED ORDER — HEPARIN SODIUM (PORCINE) 1000 UNIT/ML IJ SOLN
4000.0000 [IU] | Freq: Once | INTRAMUSCULAR | Status: AC
  Administered 2018-03-21: 4000 [IU] via INTRAVENOUS
  Filled 2018-03-21: qty 4

## 2018-03-21 MED ORDER — SODIUM CHLORIDE 0.9 % IV SOLN: 125.0000 mg | INTRAVENOUS | Status: DC

## 2018-03-21 MED ORDER — TICAGRELOR 90 MG PO TABS
45.0000 mg | ORAL_TABLET | Freq: Two times a day (BID) | ORAL | Status: DC
  Administered 2018-03-21 – 2018-03-23 (×5): 45 mg via ORAL
  Filled 2018-03-21 (×5): qty 1

## 2018-03-21 MED ORDER — TICAGRELOR 90 MG PO TABS: 90.0000 mg | ORAL_TABLET | Freq: Two times a day (BID) | ORAL | Status: DC

## 2018-03-21 NOTE — Progress Notes (Signed)
ANTICOAGULATION CONSULT NOTE   Pharmacy Consult for heparin Indication: s/p stent assisted angioplasty of prox left ICA  Allergies  Allergen Reactions  . Metformin And Related Other (See Comments)    Contraindicated due to renal failure.  Renal failure-on peritoneal dialysis    . Pioglitazone Other (See Comments)    History of congestive heart failure.    . Propoxyphene Rash    Patient Measurements: Height: 6\' 3"  (190.5 cm) Weight: 152 lb 4.8 oz (69.1 kg) IBW/kg (Calculated) : 84.5  Vital Signs: Temp: 98.4 F (36.9 C) (03/07 0000) Temp Source: Oral (03/07 0000) BP: 138/45 (03/06 2151) Pulse Rate: 59 (03/06 2000)  Labs: Recent Labs    03/18/18 0500 03/19/18 0518 03/20/18 0611 03/20/18 1625 03/21/18 0033  HGB 7.3* 7.2* 10.5*  --   --   HCT 23.2* 22.6* 31.7*  --   --   PLT 275 273 278  --   --   HEPARINUNFRC  --   --   --  <0.10* <0.10*  CREATININE 10.84* 8.03* 10.38*  --   --     Estimated Creatinine Clearance: 6.5 mL/min (A) (by C-G formula based on SCr of 10.38 mg/dL (H)).   Medical History: Past Medical History:  Diagnosis Date  . Anemia   . CAD (coronary artery disease)   . CHF (congestive heart failure) ([place])   . Diabetes ([place])   . ESRD (end stage renal disease) on dialysis ([place])   . Headache   . HTN (hypertension)   . PEA (Pulseless electrical activity) ([place]) 11/2017  . Traumatic brain injury [facility])     Medications:  Medications Prior to Admission  Medication Sig Dispense Refill Last Dose  . acetaminophen (TYLENOL) 500 MG tablet Take 1,500 mg by mouth daily as needed (pain).    week ago  . amLODipine (NORVASC) 5 MG tablet Take 5 mg by mouth at bedtime.   02/26/2018 at pm  . aspirin EC 325 MG tablet Take 325 mg by mouth daily.   02/26/2018 at am  . atorvastatin (LIPITOR) 40 MG tablet Take 40 mg by mouth daily at 6 PM.   02/26/2018 at pm  . Calcium Carbonate Antacid (TUMS PO) Take 1-3 mg by mouth See admin instructions. Take 3 tablets by mouth up to  three times daily with full meals, take 1 tablet with snacks/small meals   02/26/2018 at pm  . famotidine (PEPCID) 20 MG tablet Take 20 mg by mouth at bedtime.   02/26/2018 at pm  . furosemide (LASIX) 40 MG tablet Take 40 mg by mouth 2 (two) times daily.   02/26/2018 at pm  . isosorbide mononitrate (IMDUR) 60 MG 24 hr tablet Take 60 mg by mouth daily.   02/26/2018 at am  . losartan (COZAAR) 100 MG tablet Take 100 mg by mouth daily.   02/26/2018 at am  . metoprolol (TOPROL-XL) 200 MG 24 hr tablet Take 200 mg by mouth daily.   02/26/2018 at 1000  . multivitamin (RENA-VIT) TABS tablet Take 1 tablet by mouth daily.   02/26/2018 at am  . nitroGLYCERIN (NITROSTAT) 0.4 MG SL tablet Place 0.4 mg under the tongue every 5 (five) minutes as needed for chest pain.    never taken  . polyvinyl alcohol (ARTIFICIAL TEARS) 1.4 % ophthalmic solution Place 1 drop into both eyes daily as needed for dry eyes.   1-2 weeks ago    Assessment: 73 y/o male with ESRD admitted 2/14 with acute stroke and subsequent NSTEMI on 2/19 and is  s/p cath with DES to LAD. He was found to have severe stenosis left proximal ICA and is s/p stent assisted angioplasty of proximal left ICA today. Pharmacy consulted to adjust heparin post-op. No overt bleeding noted, Hgb dropped to 7s, 1 unit PRBCs given on 3/5 and Hgb rose to 10.5 this am, platelets are normal. Initial heparin level <0.1 units/ml  Goal of Therapy:  Heparin level 0.2-0.5 units/ml Monitor platelets by anticoagulation protocol: Yes   Plan:  Increase heparin drip to 850 units/hr Heparin off at 0800 Monitor for s/sx of bleeding   Thanks for allowing pharmacy to be a part of this patient's care.  Excell Seltzer, PharmD Clinical Pharmacist

## 2018-03-21 NOTE — Progress Notes (Signed)
[facility] Progress Note   [facility]  MWF 3.5h R IJ TDC 149lbs (67.5kg)  Hep 2700 then 500u/hr - hect 1.5 ug IV tiw - ferrlicit - no esa  Home meds: - amlodipine 5 hs/ losartan 100/ metoprolol xl 200/ furosemide 40 bid - aspirin 325 qd/ atorvastatin 40/ sl ntg prn - famotidine 20 hs/ vitamins/ prn's  Assessment/Plan: 1. Acute CVA- 71% stenosis prox LICA > sp L ICA stent 03/20/18 by [name]. Brilinta/ asa Rx.  2. Thrombocytopenia - not sure cause, have d/w primary team.  3. NSTEMI- s/p PCI of LAD on 03/06/18. Per primary.  4. ESRDon HD qMWF. Did not get HD yest, on HD this am. Get vol down. Not sure what perm access situation is, has TDC. Not CKA patient. Next HD Monday.  5. Anemia of ckd/ blood draws/ guiac+: Hb 13 >> 7.9 here, was not getting esa at OP unit; got darbe 60 ug here 2/23 and 2/28, then 100ug 3/7 today. Tsat 22% 2/24 - getting 500mg  load of Fe here.  Got 1u prbc 3/5 and to get 1 more prbc today on HD 6. Anemia/ Guiac+ - not active bleed, seen by GI 2/28, no intervention now given recent MI/ stent 7. CKD-MBD:stable 8. Nutrition:renal diet 9. HTN: on losartan 100/ metop xl 200 qd/ amlodipine 5 > 10/qd 10. Disposition-evaluating for CIR vs SNF   [facility] 03/21/2018, 12:29 PM    Subjective:   On HD now, no breathing issues, UF goal is for 3L .   Objective Vitals:   03/21/18 1030 03/21/18 1045 03/21/18 1100 03/21/18 1200  BP: (!) 147/62 (!) 169/64 (!) 159/74 (!) 182/74  Pulse: 67 65 65 74  Resp: 20 20 20 19   Temp:   (!) 97.5 F (36.4 C)   TempSrc:   Oral   SpO2: 95% 98% 99% 99%  Weight:   68.3 kg   Height:       Physical Exam General: NAD sitting in bed, on HD Heart: RRR Lungs: no rales Abdomen: soft NT ND + BS Extremities: no LE edema, no UE edema Dialysis Access:  Right IJ [facility]   Additional Objective Labs: Basic Metabolic Panel: Recent Labs  Lab 03/18/18 0500 03/19/18 0518  03/20/18 0611 03/21/18 0423  NA 132* 134* 133* 132*  K 4.5 3.9 4.5 4.9  CL 97* 96* 95* 97*  CO2 24 25 22  17*  GLUCOSE 124* 130* 111* 135*  BUN 39* 27* 42* 52*  CREATININE 10.84* 8.03* 10.38* 11.86*  CALCIUM 8.3* 8.2* 9.1 8.0*  PHOS 4.3 3.8  --  5.2*   Liver Function Tests: Recent Labs  Lab 03/18/18 0500 03/19/18 0518 03/21/18 0423  ALBUMIN 2.9* 2.7* 2.7*   No results for input(s): LIPASE, AMYLASE in the last 168 hours. CBC: Recent Labs  Lab 03/17/18 0054 03/18/18 0500 03/19/18 0518 03/20/18 0611 03/21/18 0423 03/21/18 0815  WBC 10.5 11.1* 11.0* 11.0* 9.5 8.1  NEUTROABS 6.0  --   --  5.6 6.1  --   HGB 8.3* 7.3* 7.2* 10.5* 7.8* 8.2*  HCT 25.6* 23.2* 22.6* 31.7* 22.8* 23.7*  MCV 91.1 92.1 91.1 91.6 91.9 92.6  PLT 301 275 273 278 46* 49*   Blood Culture No results found for: SDES, SPECREQUEST, CULT, REPTSTATUS  Cardiac Enzymes: No results for input(s): CKTOTAL, CKMB, CKMBINDEX, TROPONINI in the last 168 hours. CBG: Recent Labs  Lab 03/18/18 1105  GLUCAP 95   Iron Studies: No results for input(s):  IRON, TIBC, TRANSFERRIN, FERRITIN in the last 72 hours. Lab Results  Component Value Date   INR 1.2 03/17/2018   INR 1.1 03/09/2018   INR 1.08 02/27/2018   Studies/Results: No results found. Medications: . sodium chloride 10 mL/hr at 03/20/18 1101  . clevidipine Stopped (03/21/18 1006)  . ferric gluconate (FERRLECIT/NULECIT) IV 125 mg (03/21/18 1005)   . amLODipine  10 mg Oral QHS  . aspirin  81 mg Oral Daily   Or  . aspirin  81 mg Per Tube Daily  . atorvastatin  40 mg Oral q1800  . Chlorhexidine Gluconate Cloth  6 each Topical Q0600  . darbepoetin (ARANESP) injection - DIALYSIS  100 mcg Intravenous Q Fri-HD  . famotidine  20 mg Oral QHS  . fluticasone  2 spray Each Nare BID  . isosorbide mononitrate  60 mg Oral Daily  . loratadine  10 mg Oral Q48H  . losartan  100 mg Oral Daily  . metoprolol succinate  200 mg Oral Daily  . multivitamin  1 tablet Oral  Daily  . polyethylene glycol  17 g Oral Daily  . senna-docusate  1 tablet Oral BID  . ticagrelor  45 mg Oral BID  . tirofiban  250 mcg Intravenous Once

## 2018-03-21 NOTE — Progress Notes (Signed)
PROGRESS NOTE    Peter [name]  SNK:[id number] DOB: 05-05-1945 DOA: 02/27/2018 PCP: Patient, No Pcp Per   Brief Narrative:  HPI On 02/27/2018 by Dr. Shela Leff Nasif Bos is a 73 y.o. male with a past medical history of ESRD on HD, hypertension being brought to the hospital from his dialysis center due to strokelike symptoms with left sided weakness. Last known normal 3:15 this afternoon.  Patient states after finishing dialysis today he felt weak and could not get out of the chair.  Both of his legs felt weak and his right leg was shaking.  He could not lift his arms.  States he was told that his left arm was not moving.  Denies any history of prior stroke.  He is a former smoker; quit 4 years ago.  He was previously smoking 1/2 pack/day for about 20 years.  He is complaining of a headache.  Denies having any chest pain.  No additional history could be obtained from the patient.  Interim history Patient presented to the hospital with left-sided weakness after dialysis on 02/27/2018 and was found to have an acute stroke, neurology consulted.  Also noted to have an end STEMI on 03/04/2018 with chest pain.  Status post DES.  S/p Left ICA stent placement.  Assessment & Plan   Acute CVA -Embolic, unknown source -Patient presented with left-sided weakness after dialysis he did not receive TPA due to mild deficits -Neurology consulted and appreciated -Cerebral arteriogram showed 90% stenosis of the proximal left ICA.  Left ICA stent will be placed on 03/20/2018 by interventional radiology. -Echocardiogram as listed below -Continue aspirin, brilinta, statin  Left ICA stenosis -IR consulted, planning for procedure today 03/20/2018 -Dr. Estanislado Pandy recommending discontinuing Plavix, and starting Brilinta and has been following P2Y12  -s/p cerebral angiogram- stent assisted angioplasty of high grade stenosis of prox L ICA with distal protection -appears patient was given aggrastat and heparin-  heparin now discontinued -Per IR- follow up in the clinic in 2 weeks -Discussed with Dr. Estanislado Pandy, will decrease dose of Birlinta to 45mg  BID until platelets rebound  Acute thrombocytopenia  -Platelets dropped to 46; repeat 49 this morning -possibly secondary Aggrastat -heparin has been discontinued-unlikely HIT given that patient has had heparin exposure with dialysis -Will order DIC panel as well as platelets and citrate to review for any clumping. -Discussed with oncology, given patient's history, may need platelet transfusion if platelets drop below 30.   -Will continue to monitor closely  NSTEMI -Patient did have chest pain the night of 03/03/2018 03/04/2018, with a peak troponin of 6.4 -Cardiology consulted and appreciated -Status post cardiac catheterization on 03/06/2018 with PCI/DES x1 to the LAD with scoring balloon to ISR in the ostium of OM1.  Continue to treat RCA disease medically. -Patient will need DAPT with aspirin and Plavix for at least 1 year once done with Brilinta as per interventional radiology recommendation -Cardiology recommended 30-day event monitor with TEE/possible ICD evaluation on outpatient basis through his primary cardiologist, Dr. Beatrix Fetters -Of note, TEE was attempted but aborted on 03/03/2018, as patient was unable to be sedated adequately -No further complaints of chest pain  Chronic systolic congestive heart failure -Echocardiogram on 02/28/2018 showed an EF of 30 to 35%, cavity size mildly dilated.  Diffuse hypokinesis.  Impaired relaxation of left ventricle -Continue volume management with dialysis -Follow-up with cardiology as an outpatient  ESRD -Patient dialyzes Monday Wednesday Friday -Nephrology consulted and appreciated -patient dialyzing today  Anemia of chronic disease -FOBT was positive  however does not appear to be actively bleeding. -Previous hospitalist discussed with gastroenterologist, Dr. Silverio Decamp on 03/13/2018, given his recent MI and  stent placement, heme positive, would recommend outpatient follow-up for colonoscopy -Hemoglobin dropped to 732, given 1u PRBC on 03/19/2018 -hemoglobin today 8.2 (not sure that hemoglobin of 10.5 on 3/6 was accurate given that patient only received 1u) -Discussed with Dr. Jonnie Finner, nephrology, will order additional unit for transfusion today  -contin -Patient may need additional unit- however would hold off until next HD session given that patient feels "bloated" today and have CHF- would not want to volume overload him prior to surgery -Continue to monitor CBC  Right renal mass -Incidental finding from abdominal ultrasound on 02/28/2018: Small mass in the lower pole of the right kidney, could reflect complex cyst or solid lesion.  Noncontrast MRI best available imaging option. -Will need to follow-up as an outpatient  Nasal congestion -Continue Claritin, Flonase  Essential hypertension -Stable, continue amlodipine, losartan, metoprolol  Failure to thrive/physical deconditioning  -Likely due to prolonged hospitalization -Patient has progressed with physical therapy, now recommending home health  DVT Prophylaxis  SCDs  Code Status: Full  Family Communication: Daughter at bedside  Disposition Plan: Admitted. Pending improvement/stabilization in hemoglobin and platelets. Dispo [facility] home with [facility]  Consultants Neurology Interventional radiology Nephrology Gastroenterology Inpatient rehab Cardiology  Procedures  Cardiogram Cardiac catheterization Cerebral angiogram  Antibiotics   Anti-infectives (From admission, onward)   Start     Dose/Rate Route Frequency Ordered Stop   03/20/18 0000  ceFAZolin (ANCEF) IVPB 2g/100 mL premix     2 g 200 mL/hr over 30 Minutes Intravenous 60 min pre-op 03/18/18 0112 03/20/18 1155   03/17/18 0600  ceFAZolin (ANCEF) IVPB 2g/100 mL premix  Status:  Discontinued     2 g 200 mL/hr over 30 Minutes Intravenous To Short Stay 03/16/18 0729 03/17/18  0950      Subjective:   [name] seen and examined today.  Feels somewhat sleepy today.  No major complaints.  Divides chest pain, shortness of breath, abdominal pain, nausea vomiting, diarrhea constipation, dizziness or headache. Objective:   Vitals:   03/21/18 0845 03/21/18 0900 03/21/18 0915 03/21/18 0930  BP: (!) 157/51 (!) 160/52 (!) 148/54 126/66  Pulse: (!) 59 (!) 59 62   Resp: 16 (!) 21 16 18   Temp:      TempSrc:      SpO2: 100% 100% 100%   Weight:      Height:        Intake/Output Summary (Last 24 hours) at 03/21/2018 0936 Last data filed at 03/21/2018 0700 Gross per 24 hour  Intake 2336.48 ml  Output 25 ml  Net 2311.48 ml   Filed Weights   03/19/18 0625 03/20/18 0559 03/21/18 0705  Weight: 67.1 kg 69.1 kg 69.9 kg   Exam  General: Well developed, thin, NAD, appears stated age  21: NCAT, mucous membranes moist.   Neck: Supple  Cardiovascular: S1 S2 auscultated, RRR  Respiratory: Clear to auscultation bilaterally  Abdomen: Soft, nontender, nondistended, + bowel sounds  Extremities: warm dry without cyanosis clubbing or edema  Neuro: AAOx3, nonfocal  Psych: Appropriate mood and affect, pleasant   Data Reviewed: I have personally reviewed following labs and imaging studies  CBC: Recent Labs  Lab 03/17/18 0054 03/18/18 0500 03/19/18 0518 03/20/18 0611 03/21/18 0423 03/21/18 0815  WBC 10.5 11.1* 11.0* 11.0* 9.5 8.1  NEUTROABS 6.0  --   --  5.6 6.1  --   HGB 8.3* 7.3*  7.2* 10.5* 7.8* 8.2*  HCT 25.6* 23.2* 22.6* 31.7* 22.8* 23.7*  MCV 91.1 92.1 91.1 91.6 91.9 92.6  PLT 301 275 273 278 46* 49*   Basic Metabolic Panel: Recent Labs  Lab 03/16/18 0729 03/17/18 0054 03/18/18 0500 03/19/18 0518 03/20/18 0611 03/21/18 0423  NA 132* 135 132* 134* 133* 132*  K 3.8 4.5 4.5 3.9 4.5 4.9  CL 96* 97* 97* 96* 95* 97*  CO2 24 25 24 25 22  17*  GLUCOSE 140* 114* 124* 130* 111* 135*  BUN 42* 24* 39* 27* 42* 52*  CREATININE 12.18* 7.83* 10.84* 8.03*  10.38* 11.86*  CALCIUM 8.0* 8.2* 8.3* 8.2* 9.1 8.0*  PHOS 4.8*  --  4.3 3.8  --  5.2*   GFR: Estimated Creatinine Clearance: 5.7 mL/min (A) (by C-G formula based on SCr of 11.86 mg/dL (H)). Liver Function Tests: Recent Labs  Lab 03/16/18 0729 03/18/18 0500 03/19/18 0518 03/21/18 0423  ALBUMIN 2.6* 2.9* 2.7* 2.7*   No results for input(s): LIPASE, AMYLASE in the last 168 hours. No results for input(s): AMMONIA in the last 168 hours. Coagulation Profile: Recent Labs  Lab 03/17/18 0054  INR 1.2   Cardiac Enzymes: No results for input(s): CKTOTAL, CKMB, CKMBINDEX, TROPONINI in the last 168 hours. BNP (last 3 results) No results for input(s): PROBNP in the last 8760 hours. HbA1C: No results for input(s): HGBA1C in the last 72 hours. CBG: Recent Labs  Lab 03/18/18 1105  GLUCAP 95   Lipid Profile: No results for input(s): CHOL, HDL, LDLCALC, TRIG, CHOLHDL, LDLDIRECT in the last 72 hours. Thyroid Function Tests: No results for input(s): TSH, T4TOTAL, FREET4, T3FREE, THYROIDAB in the last 72 hours. Anemia Panel: No results for input(s): VITAMINB12, FOLATE, FERRITIN, TIBC, IRON, RETICCTPCT in the last 72 hours. Urine analysis: No results found for: COLORURINE, APPEARANCEUR, LABSPEC, PHURINE, GLUCOSEU, HGBUR, BILIRUBINUR, KETONESUR, PROTEINUR, UROBILINOGEN, NITRITE, LEUKOCYTESUR Sepsis Labs: @LABRCNTIP (procalcitonin:4,lacticidven:4)  )No results found for this or any previous visit (from the past 240 hour(s)).    Radiology Studies: No results found.   Scheduled Meds: . amLODipine  10 mg Oral QHS  . aspirin  81 mg Oral Daily   Or  . aspirin  81 mg Per Tube Daily  . atorvastatin  40 mg Oral q1800  . Chlorhexidine Gluconate Cloth  6 each Topical Q0600  . darbepoetin (ARANESP) injection - DIALYSIS  100 mcg Intravenous Q Fri-HD  . famotidine  20 mg Oral QHS  . fluticasone  2 spray Each Nare BID  . isosorbide mononitrate  60 mg Oral Daily  . loratadine  10 mg Oral Q48H    . losartan  100 mg Oral Daily  . metoprolol succinate  200 mg Oral Daily  . multivitamin  1 tablet Oral Daily  . polyethylene glycol  17 g Oral Daily  . senna-docusate  1 tablet Oral BID  . ticagrelor  90 mg Oral BID   Or  . ticagrelor  90 mg Per Tube BID  . tirofiban  250 mcg Intravenous Once   Continuous Infusions: . sodium chloride 10 mL/hr at 03/20/18 1101  . clevidipine 20 mg/hr (03/21/18 0814)  . ferric gluconate (FERRLECIT/NULECIT) IV Stopped (03/18/18 1022)     LOS: 21 days   Time Spent in minutes   45 minutes  Wyeth Hoffer D.O. on 03/21/2018 at 9:36 AM  Between 7am to 7pm - Please see pager noted on [email or web address]  After 7pm go to [email or web address]  And look for the night coverage person covering for me  after hours  [facility]  [phone]

## 2018-03-21 NOTE — Progress Notes (Signed)
Pt tx to 3w21. Report called to [facility]. Assessment stable. All belongings sent with pt.

## 2018-03-21 NOTE — Progress Notes (Signed)
Patient ID: Peter [name], male   DOB: [birth year], 73 y.o.   MRN: [id number] INR. POst treatment day 1 .Lt ICA stent assisted angioplasty  with distal protection. Comfortable night. Denies any H/A,N/V visual ,motor or coordination difficulties. Tolerating liquids.  Presently undergoin dialysis. O/E  In no acutre distress. AAOx 3 pupils 64mm RT = LT  EOMS full. No facial asymmetry. Tongue in the midline. NO pronation drift. RT groin soft. Distal pulses dopplerable. Labs. HB 7.8. RBCs 2.48.  Plan. Patient may be transferred to primary service. To continue on aspirin 80 mg per day and brilinta 90 mg BID. 3RTC in clinic in 2 weeks post D/C. INR scedular numbers [phone] or [phone]. S.Florentino Laabs MD

## 2018-03-21 NOTE — Progress Notes (Addendum)
Pt refused to do hemodialysis tonight and pt wants to do it tomorrow. Report from Leticia Clas RN. Dr. Carolin Sicks notified. Pt vital signs stable.Potassium=4.5.

## 2018-03-22 LAB — DIC (DISSEMINATED INTRAVASCULAR COAGULATION)PANEL
D-Dimer, Quant: 2.22 ug/mL-FEU — ABNORMAL HIGH (ref 0.00–0.50)
Fibrinogen: 541 mg/dL — ABNORMAL HIGH (ref 210–475)
INR: 1.2 (ref 0.8–1.2)
Platelets: 68 10*3/uL — ABNORMAL LOW (ref 150–400)
Prothrombin Time: 14.9 seconds (ref 11.4–15.2)
Smear Review: NONE SEEN
aPTT: 35 seconds (ref 24–36)

## 2018-03-22 LAB — CBC
HCT: 26.3 % — ABNORMAL LOW (ref 39.0–52.0)
Hemoglobin: 8.9 g/dL — ABNORMAL LOW (ref 13.0–17.0)
MCH: 30.4 pg (ref 26.0–34.0)
MCHC: 33.8 g/dL (ref 30.0–36.0)
MCV: 89.8 fL (ref 80.0–100.0)
Platelets: 67 10*3/uL — ABNORMAL LOW (ref 150–400)
RBC: 2.93 MIL/uL — ABNORMAL LOW (ref 4.22–5.81)
RDW: 16.7 % — ABNORMAL HIGH (ref 11.5–15.5)
WBC: 9.2 10*3/uL (ref 4.0–10.5)
nRBC: 0 % (ref 0.0–0.2)

## 2018-03-22 LAB — BASIC METABOLIC PANEL
Anion gap: 14 (ref 5–15)
BUN: 33 mg/dL — ABNORMAL HIGH (ref 8–23)
CO2: 24 mmol/L (ref 22–32)
Calcium: 8.3 mg/dL — ABNORMAL LOW (ref 8.9–10.3)
Chloride: 96 mmol/L — ABNORMAL LOW (ref 98–111)
Creatinine, Ser: 8.85 mg/dL — ABNORMAL HIGH (ref 0.61–1.24)
GFR calc Af Amer: 6 mL/min — ABNORMAL LOW (ref >60)
GFR calc non Af Amer: 5 mL/min — ABNORMAL LOW (ref >60)
GLUCOSE: 166 mg/dL — AB (ref 70–99)
Potassium: 3.9 mmol/L (ref 3.5–5.1)
Sodium: 134 mmol/L — ABNORMAL LOW (ref 135–145)

## 2018-03-22 MED ORDER — ONDANSETRON HCL 4 MG/2ML IJ SOLN
4.0000 mg | Freq: Four times a day (QID) | INTRAMUSCULAR | Status: DC | PRN
  Administered 2018-03-22: 4 mg via INTRAVENOUS
  Filled 2018-03-22: qty 2

## 2018-03-22 MED ORDER — CHLORHEXIDINE GLUCONATE CLOTH 2 % EX PADS: 6.0000 | MEDICATED_PAD | Freq: Every day | CUTANEOUS | Status: DC

## 2018-03-22 MED ORDER — AZELASTINE HCL 0.1 % NA SOLN
1.0000 | Freq: Two times a day (BID) | NASAL | Status: DC
  Administered 2018-03-22 – 2018-03-29 (×13): 1 via NASAL
  Filled 2018-03-22: qty 30

## 2018-03-22 NOTE — Progress Notes (Addendum)
[facility] Progress Note   [facility]  MWF 3.5h R IJ TDC 149lbs (67.5kg)  Hep 2700 then 500u/hr - hect 1.5 ug IV tiw - ferrlicit - no esa  Home meds: - amlodipine 5 hs/ losartan 100/ metoprolol xl 200/ furosemide 40 bid - aspirin 325 qd/ atorvastatin 40/ sl ntg prn - famotidine 20 hs/ vitamins/ prn's  Assessment/Plan: 1. Acute CVA- 15% stenosis prox LICA > sp L ICA stent 03/20/18 by [name]. Brilinta/ asa.  2. Thrombocytopenia, acute - labs pending today. Per primary.  3. NSTEMI- s/p PCI of LAD on 03/06/18. Per primary.  4. ESRDon HD qMWF. HD Monday, no hep with low plts. Not sure what perm access situation is, has TDC.  5. HTN/volume: on losartan 100/ metop xl 200 qd/ amlodipine 5 > 10/qd. BP's still up, will ^UF on HD tomorrow , prob has lost wt here.  6. Anemia of ckd/ blood draws/ guiac+: Hb 13 >> 7.9 here, was not getting esa at OP unit; got darbe 60 ug here 2/23 and 2/28, then 100ug 3/7 today. Tsat 22% 2/24 - getting 500mg  load of Fe here.  Got 1u prbc 3/5 and to get 1 more prbc today on HD 7. Anemia/ Guiac+ - not active bleed, seen by GI 2/28, no intervention now given recent MI/ stent 8. CKD-MBD:stable 9. Nutrition:renal diet 10.  11. Disposition-  for dc home when ready   [place] 03/22/2018, 11:53 AM    Subjective:   Stable today, no new c/o's. 2.5 L net off on HD yest  Objective Vitals:   03/21/18 2256 03/22/18 0347 03/22/18 0417 03/22/18 0957  BP: (!) 170/64 (!) 181/78 (!) 168/64 (!) 171/63  Pulse:  78  78  Resp: 18 19  18   Temp: 98.7 F (37.1 C) 99.6 F (37.6 C)  98.8 F (37.1 C)  TempSrc: Oral Oral  Oral  SpO2: 93% 100%  94%  Weight:      Height:       Physical Exam General: NAD sitting in bed Heart: RRR Lungs: no rales Abdomen: soft NT ND + BS Extremities: no LE edema, no UE edema, musc wasting Dialysis Access:  Right IJ [facility]   Additional Objective Labs: Basic Metabolic  Panel: Recent Labs  Lab 03/18/18 0500 03/19/18 0518 03/20/18 0611 03/21/18 0423  NA 132* 134* 133* 132*  K 4.5 3.9 4.5 4.9  CL 97* 96* 95* 97*  CO2 24 25 22  17*  GLUCOSE 124* 130* 111* 135*  BUN 39* 27* 42* 52*  CREATININE 10.84* 8.03* 10.38* 11.86*  CALCIUM 8.3* 8.2* 9.1 8.0*  PHOS 4.3 3.8  --  5.2*   Liver Function Tests: Recent Labs  Lab 03/18/18 0500 03/19/18 0518 03/21/18 0423  ALBUMIN 2.9* 2.7* 2.7*   No results for input(s): LIPASE, AMYLASE in the last 168 hours. CBC: Recent Labs  Lab 03/17/18 0054 03/18/18 0500 03/19/18 0518 03/20/18 0611 03/21/18 0423 03/21/18 0815  WBC 10.5 11.1* 11.0* 11.0* 9.5 8.1  NEUTROABS 6.0  --   --  5.6 6.1  --   HGB 8.3* 7.3* 7.2* 10.5* 7.8* 8.2*  HCT 25.6* 23.2* 22.6* 31.7* 22.8* 23.7*  MCV 91.1 92.1 91.1 91.6 91.9 92.6  PLT 301 275 273 278 46* 49*   Blood Culture No results found for: SDES, SPECREQUEST, CULT, REPTSTATUS  Cardiac Enzymes: No results for input(s): CKTOTAL, CKMB, CKMBINDEX, TROPONINI in the last 168 hours. CBG: Recent Labs  Lab 03/18/18 [place]rum  95   Iron Studies: No results for input(s): IRON, TIBC, TRANSFERRIN, FERRITIN in the last 72 hours. Lab Results  Component Value Date   INR 1.2 03/17/2018   INR 1.1 03/09/2018   INR 1.08 02/27/2018   Studies/Results: No results found. Medications: . sodium chloride 10 mL/hr at 03/20/18 1101  . ferric gluconate (FERRLECIT/NULECIT) IV 125 mg (03/21/18 1005)   . amLODipine  10 mg Oral QHS  . aspirin  81 mg Oral Daily   Or  . aspirin  81 mg Per Tube Daily  . atorvastatin  40 mg Oral q1800  . azelastine  1 spray Each Nare BID  . Chlorhexidine Gluconate Cloth  6 each Topical Q0600  . darbepoetin (ARANESP) injection - DIALYSIS  100 mcg Intravenous Q Fri-HD  . famotidine  20 mg Oral QHS  . fluticasone  2 spray Each Nare BID  . isosorbide mononitrate  60 mg Oral Daily  . loratadine  10 mg Oral Q48H  . losartan  100 mg Oral Daily  . metoprolol  succinate  200 mg Oral Daily  . multivitamin  1 tablet Oral Daily  . polyethylene glycol  17 g Oral Daily  . senna-docusate  1 tablet Oral BID  . ticagrelor  45 mg Oral BID

## 2018-03-22 NOTE — Progress Notes (Signed)
PROGRESS NOTE    Peter [name]  ATF:[id number] DOB: 1945/04/28 DOA: 02/27/2018 PCP: Patient, No Pcp Per   Brief Narrative:  HPI On 02/27/2018 by Dr. Shela Leff Peter [name] is a 73 y.o. male with a past medical history of ESRD on HD, hypertension being brought to the hospital from his dialysis center due to strokelike symptoms with left sided weakness. Last known normal 3:15 this afternoon.  Patient states after finishing dialysis today he felt weak and could not get out of the chair.  Both of his legs felt weak and his right leg was shaking.  He could not lift his arms.  States he was told that his left arm was not moving.  Denies any history of prior stroke.  He is a former smoker; quit 4 years ago.  He was previously smoking 1/2 pack/day for about 20 years.  He is complaining of a headache.  Denies having any chest pain.  No additional history could be obtained from the patient.  Interim history Patient presented to the hospital with left-sided weakness after dialysis on 02/27/2018 and was found to have an acute stroke, neurology consulted.  Also noted to have an end STEMI on 03/04/2018 with chest pain.  Status post DES.  S/p Left ICA stent placement.  Assessment & Plan   Acute CVA -Embolic, unknown source -Patient presented with left-sided weakness after dialysis he did not receive TPA due to mild deficits -Neurology consulted and appreciated -Cerebral arteriogram showed 90% stenosis of the proximal left ICA.  Left ICA stent will be placed on 03/20/2018 by interventional radiology. -Echocardiogram as listed below -Continue aspirin, brilinta, statin  Left ICA stenosis -IR consulted, planning for procedure today 03/20/2018 -Dr. Estanislado Pandy recommending discontinuing Plavix, and starting Brilinta and has been following P2Y12  -s/p cerebral angiogram- stent assisted angioplasty of high grade stenosis of prox L ICA with distal protection -appears patient was given aggrastat and heparin-  heparin now discontinued -Per IR- follow up in the clinic in 2 weeks -Discussed with Dr. Estanislado Pandy, will decrease dose of Birlinta to 45mg  BID until platelets rebound  Acute thrombocytopenia  -Platelets dropped to 46; repeat 49 this morning -possibly secondary Aggrastat -heparin has been discontinued-unlikely HIT given that patient has had heparin exposure with dialysis -Pending DIC panel as well as platelets and citrate to review for any clumping. -Discussed with oncology, given patient's history, may need platelet transfusion if platelets drop below 30.   -Pending labs this morning  NSTEMI -Patient did have chest pain the night of 03/03/2018 03/04/2018, with a peak troponin of 6.4 -Cardiology consulted and appreciated -Status post cardiac catheterization on 03/06/2018 with PCI/DES x1 to the LAD with scoring balloon to ISR in the ostium of OM1.  Continue to treat RCA disease medically. -Patient will need DAPT with aspirin and Plavix for at least 1 year once done with Brilinta as per interventional radiology recommendation -Cardiology recommended 30-day event monitor with TEE/possible ICD evaluation on outpatient basis through his primary cardiologist, Dr. Beatrix Fetters -Of note, TEE was attempted but aborted on 03/03/2018, as patient was unable to be sedated adequately -No further complaints of chest pain  Chronic systolic congestive heart failure -Echocardiogram on 02/28/2018 showed an EF of 30 to 35%, cavity size mildly dilated.  Diffuse hypokinesis.  Impaired relaxation of left ventricle -Continue volume management with dialysis -Follow-up with cardiology as an outpatient  ESRD -Patient dialyzes Monday Wednesday Friday -Nephrology consulted and appreciated  Anemia of chronic disease -FOBT was positive however does not appear to  be actively bleeding. -Previous hospitalist discussed with gastroenterologist, Dr. Silverio Decamp on 03/13/2018, given his recent MI and stent placement, heme positive,  would recommend outpatient follow-up for colonoscopy -Hemoglobin dropped to 7.2, given 1u PRBC on 03/19/2018 -given additional unit on 03/20/2000 with HD -pending CBC today  Right renal mass -Incidental finding from abdominal ultrasound on 02/28/2018: Small mass in the lower pole of the right kidney, could reflect complex cyst or solid lesion.  Noncontrast MRI best available imaging option. -Will need to follow-up as an outpatient  Nasal congestion -Continue Claritin, Flonase  Essential hypertension -Stable, continue amlodipine, losartan, metoprolol  Failure to thrive/physical deconditioning  -Likely due to prolonged hospitalization -Patient has progressed with physical therapy, now recommending home health  DVT Prophylaxis  SCDs  Code Status: Full  Family Communication: None at bedside  Disposition Plan: Admitted. Pending improvement/stabilization in hemoglobin and platelets. Dispo likely home with [facility]  Consultants Neurology Interventional radiology Nephrology Gastroenterology Inpatient rehab Cardiology  Procedures  Cardiogram Cardiac catheterization Cerebral angiogram  Antibiotics   Anti-infectives (From admission, onward)   Start     Dose/Rate Route Frequency Ordered Stop   03/20/18 0000  ceFAZolin (ANCEF) IVPB 2g/100 mL premix     2 g 200 mL/hr over 30 Minutes Intravenous 60 min pre-op 03/18/18 0112 03/20/18 1155   03/17/18 0600  ceFAZolin (ANCEF) IVPB 2g/100 mL premix  Status:  Discontinued     2 g 200 mL/hr over 30 Minutes Intravenous To Short Stay 03/16/18 0729 03/17/18 0950      Subjective:   Peter [name] seen and examined today.  Patient was unable to the rest well last night.  Would like a sleep aid.  Denies current chest pain, shortness breath, abdominal pain, nausea or vomiting, diarrhea constipation, dizziness or headache. Objective:   Vitals:   03/21/18 2044 03/21/18 2256 03/22/18 0347 03/22/18 0417  BP: (!) 175/96 (!) 170/64 (!) 181/78 (!) 168/64    Pulse: 74  78   Resp: 18 18 19    Temp: 98.8 F (37.1 C) 98.7 F (37.1 C) 99.6 F (37.6 C)   TempSrc: Oral Oral Oral   SpO2: 93% 93% 100%   Weight:      Height:        Intake/Output Summary (Last 24 hours) at 03/22/2018 0923 Last data filed at 03/22/2018 0500 Gross per 24 hour  Intake 402.8 ml  Output 2500 ml  Net -2097.2 ml   Filed Weights   03/20/18 0559 03/21/18 0705 03/21/18 1100  Weight: 69.1 kg 71.9 kg 68.3 kg   Exam  General: Well developed, thin, NAD  HEENT: NCAT, mucous membranes moist.   Cardiovascular: S1 S2 auscultated, RRR  Respiratory: Clear to auscultation bilaterally with equal chest rise  Abdomen: Soft, nontender, nondistended, + bowel sounds  Extremities: warm dry without cyanosis clubbing or edema  Neuro: AAOx3, nonfocal  Psych: Appropriate mood and affect   Data Reviewed: I have personally reviewed following labs and imaging studies  CBC: Recent Labs  Lab 03/17/18 0054 03/18/18 0500 03/19/18 0518 03/20/18 0611 03/21/18 0423 03/21/18 0815  WBC 10.5 11.1* 11.0* 11.0* 9.5 8.1  NEUTROABS 6.0  --   --  5.6 6.1  --   HGB 8.3* 7.3* 7.2* 10.5* 7.8* 8.2*  HCT 25.6* 23.2* 22.6* 31.7* 22.8* 23.7*  MCV 91.1 92.1 91.1 91.6 91.9 92.6  PLT 301 275 273 278 46* 49*   Basic Metabolic Panel: Recent Labs  Lab 03/16/18 0729 03/17/18 0054 03/18/18 0500 03/19/18 0518 03/20/18 0611 03/21/18 0423  NA  132* 135 132* 134* 133* 132*  K 3.8 4.5 4.5 3.9 4.5 4.9  CL 96* 97* 97* 96* 95* 97*  CO2 24 25 24 25 22  17*  GLUCOSE 140* 114* 124* 130* 111* 135*  BUN 42* 24* 39* 27* 42* 52*  CREATININE 12.18* 7.83* 10.84* 8.03* 10.38* 11.86*  CALCIUM 8.0* 8.2* 8.3* 8.2* 9.1 8.0*  PHOS 4.8*  --  4.3 3.8  --  5.2*   GFR: Estimated Creatinine Clearance: 5.6 mL/min (A) (by C-G formula based on SCr of 11.86 mg/dL (H)). Liver Function Tests: Recent Labs  Lab 03/16/18 0729 03/18/18 0500 03/19/18 0518 03/21/18 0423  ALBUMIN 2.6* 2.9* 2.7* 2.7*   No results for  input(s): LIPASE, AMYLASE in the last 168 hours. No results for input(s): AMMONIA in the last 168 hours. Coagulation Profile: Recent Labs  Lab 03/17/18 0054  INR 1.2   Cardiac Enzymes: No results for input(s): CKTOTAL, CKMB, CKMBINDEX, TROPONINI in the last 168 hours. BNP (last 3 results) No results for input(s): PROBNP in the last 8760 hours. HbA1C: No results for input(s): HGBA1C in the last 72 hours. CBG: Recent Labs  Lab 03/18/18 1105  GLUCAP 95   Lipid Profile: No results for input(s): CHOL, HDL, LDLCALC, TRIG, CHOLHDL, LDLDIRECT in the last 72 hours. Thyroid Function Tests: No results for input(s): TSH, T4TOTAL, FREET4, T3FREE, THYROIDAB in the last 72 hours. Anemia Panel: No results for input(s): VITAMINB12, FOLATE, FERRITIN, TIBC, IRON, RETICCTPCT in the last 72 hours. Urine analysis: No results found for: COLORURINE, APPEARANCEUR, LABSPEC, PHURINE, GLUCOSEU, HGBUR, BILIRUBINUR, KETONESUR, PROTEINUR, UROBILINOGEN, NITRITE, LEUKOCYTESUR Sepsis Labs: @LABRCNTIP (procalcitonin:4,lacticidven:4)  )No results found for this or any previous visit (from the past 240 hour(s)).    Radiology Studies: No results found.   Scheduled Meds: . amLODipine  10 mg Oral QHS  . aspirin  81 mg Oral Daily   Or  . aspirin  81 mg Per Tube Daily  . atorvastatin  40 mg Oral q1800  . Chlorhexidine Gluconate Cloth  6 each Topical Q0600  . darbepoetin (ARANESP) injection - DIALYSIS  100 mcg Intravenous Q Fri-HD  . famotidine  20 mg Oral QHS  . fluticasone  2 spray Each Nare BID  . isosorbide mononitrate  60 mg Oral Daily  . loratadine  10 mg Oral Q48H  . losartan  100 mg Oral Daily  . metoprolol succinate  200 mg Oral Daily  . multivitamin  1 tablet Oral Daily  . polyethylene glycol  17 g Oral Daily  . senna-docusate  1 tablet Oral BID  . ticagrelor  45 mg Oral BID   Continuous Infusions: . sodium chloride 10 mL/hr at 03/20/18 1101  . ferric gluconate (FERRLECIT/NULECIT) IV 125 mg  (03/21/18 1005)     LOS: 22 days   Time Spent in minutes   30 minutes  Peter [name] D.O. on 03/22/2018 at 9:23 AM  Between 7am to 7pm - Please see pager noted on [email or web address]  After 7pm go to [email or web address]  And look for the night coverage person covering for me after hours  [facility] Office  [phone]

## 2018-03-23 ENCOUNTER — Encounter (HOSPITAL_COMMUNITY): Payer: Self-pay | Admitting: Interventional Radiology

## 2018-03-23 LAB — BASIC METABOLIC PANEL
ANION GAP: 16 — AB (ref 5–15)
BUN: 42 mg/dL — ABNORMAL HIGH (ref 8–23)
CO2: 20 mmol/L — ABNORMAL LOW (ref 22–32)
Calcium: 8.3 mg/dL — ABNORMAL LOW (ref 8.9–10.3)
Chloride: 98 mmol/L (ref 98–111)
Creatinine, Ser: 10.7 mg/dL — ABNORMAL HIGH (ref 0.61–1.24)
GFR calc non Af Amer: 4 mL/min — ABNORMAL LOW (ref >60)
GFR, EST AFRICAN AMERICAN: 5 mL/min — AB (ref >60)
Glucose, Bld: 130 mg/dL — ABNORMAL HIGH (ref 70–99)
Potassium: 4.3 mmol/L (ref 3.5–5.1)
SODIUM: 134 mmol/L — AB (ref 135–145)

## 2018-03-23 LAB — BPAM RBC
Blood Product Expiration Date: 202003122359
Blood Product Expiration Date: 202003182359
Blood Product Expiration Date: 202003272359
Blood Product Expiration Date: 202003312359
ISSUE DATE / TIME: 202003051046
ISSUE DATE / TIME: 202003071015
UNIT TYPE AND RH: 6200
UNIT TYPE AND RH: 6200
Unit Type and Rh: 6200
Unit Type and Rh: 6200

## 2018-03-23 LAB — TYPE AND SCREEN
ABO/RH(D): A POS
Antibody Screen: NEGATIVE
Unit division: 0
Unit division: 0
Unit division: 0
Unit division: 0

## 2018-03-23 LAB — CBC
HCT: 27.4 % — ABNORMAL LOW (ref 39.0–52.0)
Hemoglobin: 8.9 g/dL — ABNORMAL LOW (ref 13.0–17.0)
MCH: 29.5 pg (ref 26.0–34.0)
MCHC: 32.5 g/dL (ref 30.0–36.0)
MCV: 90.7 fL (ref 80.0–100.0)
Platelets: 81 10*3/uL — ABNORMAL LOW (ref 150–400)
RBC: 3.02 MIL/uL — ABNORMAL LOW (ref 4.22–5.81)
RDW: 16.6 % — ABNORMAL HIGH (ref 11.5–15.5)
WBC: 10.8 10*3/uL — ABNORMAL HIGH (ref 4.0–10.5)
nRBC: 0.2 % (ref 0.0–0.2)

## 2018-03-23 LAB — GLUCOSE, CAPILLARY: Glucose-Capillary: 100 mg/dL — ABNORMAL HIGH (ref 70–99)

## 2018-03-23 MED ORDER — ANTICOAGULANT SODIUM CITRATE 4% (200MG/5ML) IV SOLN
5.0000 mL | Status: DC
  Administered 2018-03-23: 5 mL via INTRAVENOUS
  Filled 2018-03-23 (×2): qty 5

## 2018-03-23 NOTE — Progress Notes (Signed)
Physical Therapy Treatment Patient Details Name: Peter [name] MRN: [id number] DOB: 10-22-1945 Today's Date: 03/23/2018    History of Present Illness Pt is a 73 y.o. male admitted 02/27/18 with L-sided weakness while at dialysis. MRI shows small volume diffusion abnormality involving parasagittal cerebral hemispheres, additional L frontal cortical involvement compatible with acute ischemia; remote bilat cerebellar infarcts. Pt with 2x NSTEMIs since admission. S/p PCI of LAD on 2/21. 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 s/p left ICA stent placement 03/20/18. PMH includes HTN, ESRD on HD.    PT Comments    Patient not progressing towards goals today. Had dialysis this morning. Reports not feeling quite like himself today. Noted to have some jerking like movements in BUEs when trying to order lunch. Continues to have slow processing, decreased initiation and requires repetition of cues to perform tasks. Sp02 dropped to 80% on RA at rest in bed, ranged from 87-93% on 2L/min 02. Pt needed standing rest break due to new SOB, 2-3/4 DOE. RN notified of changes. Requires Min A mostly with 1 episode of LOB requiring Mod A to correct. DGI deferred secondary to change in functional/cognitive status. Will continue to follow and progress as tolerated.    Follow Up Recommendations  Home health PT;Supervision - Intermittent     Equipment Recommendations  None recommended by PT    Recommendations for Other Services       Precautions / Restrictions Precautions Precautions: Fall Precaution Comments: Inattention to left side (body and environment); watch Sp02 Restrictions Weight Bearing Restrictions: No    Mobility  Bed Mobility Overal bed mobility: Needs Assistance Bed Mobility: Supine to Sit     Supine to sit: Modified independent (Device/Increase time);HOB elevated     General bed mobility  comments: Increased time to get to EOB, no assist needed.  Transfers Overall transfer level: Needs assistance Equipment used: None Transfers: Sit to/from Stand Sit to Stand: Min guard;Min assist         General transfer comment: Min guard assist-Min A for standing balance, slow to rise. no dizziness.   Ambulation/Gait Ambulation/Gait assistance: Mod assist Gait Distance (Feet): 75 Feet Assistive device: None Gait Pattern/deviations: Step-through pattern;Decreased stride length;Staggering right;Narrow base of support     General Gait Details: Slow, unsteady gait with short steps and instability noted. LOB requiring Mod A to correct. Sp02 dropped to 87% on RA, 2-3/4 DOE. Donned 2L/min 02.    Stairs             Wheelchair Mobility    Modified Rankin (Stroke Patients Only) Modified Rankin (Stroke Patients Only) Pre-Morbid Rankin Score: No symptoms Modified Rankin: Moderately severe disability     Balance Overall balance assessment: Needs assistance Sitting-balance support: No upper extremity supported;Feet supported Sitting balance-Leahy Scale: Fair     Standing balance support: During functional activity Standing balance-Leahy Scale: Fair Standing balance comment: pt is able to static stand without UE support and adjust jeans/donn belt but requires UE support for walking due to LOB.                            Cognition Arousal/Alertness: Lethargic(just woken up from sleeping) Behavior During Therapy: Flat affect Overall Cognitive Status: Impaired/Different from baseline Area of Impairment: Problem solving;Memory                     Memory: Decreased short-term memory  Problem Solving: Slow processing;Requires verbal cues General Comments: Reports not thinking straight today; slow to respond to questions. Just woke up from nap- not sure if this is affecting cognition.      Exercises      General Comments        Pertinent  Vitals/Pain Pain Assessment: No/denies pain    Home Living                      Prior Function            PT Goals (current goals can now be found in the care plan section) Progress towards PT goals: Not progressing toward goals - comment(secondary to weakness, SOB)    Frequency    Min 4X/week      PT Plan Current plan remains appropriate    Co-evaluation              AM-PAC PT "6 Clicks" Mobility   Outcome Measure  Help needed turning from your back to your side while in a flat bed without using bedrails?: None Help needed moving from lying on your back to sitting on the side of a flat bed without using bedrails?: None Help needed moving to and from a bed to a chair (including a wheelchair)?: A Little Help needed standing up from a chair using your arms (e.g., wheelchair or bedside chair)?: A Little Help needed to walk in hospital room?: A Little Help needed climbing 3-5 steps with a railing? : A Little 6 Click Score: 20    End of Session Equipment Utilized During Treatment: Gait belt Activity Tolerance: Patient tolerated treatment well Patient left: in bed;with call bell/phone within reach;Other (comment)(OT present in room) Nurse Communication: Mobility status PT Visit Diagnosis: Other abnormalities of gait and mobility (R26.89);Muscle weakness (generalized) (M62.81)     Time: 3568-6168 PT Time Calculation (min) (ACUTE ONLY): 23 min  Charges:  $Gait Training: 8-22 mins                     Wray Kearns, PT, DPT Acute Rehabilitation Services Pager [phone] Office [place] 03/23/2018, 4:02 PM

## 2018-03-23 NOTE — Procedures (Signed)
Patient seen and examined on Hemodialysis. BP (!) 196/89   Pulse 79   Temp 98.6 F (37 C) (Oral)   Resp (!) 23   Ht 6\' 3"  (1.905 m)   Wt 70 kg   SpO2 90%   BMI 19.29 kg/m   QB 400 mL/ min vis R IJ TDC, UF goal 4L.  BP is up- correcting vol excess should improve pressures.  Tolerating treatment without complaints at this time.   Madelon Lips MD [facility] pgr [phone] 8:55 AM

## 2018-03-23 NOTE — Progress Notes (Signed)
Occupational Therapy Treatment Patient Details Name: Peter [name] MRN: [id number] DOB: [birth year] Today's Date: 03/23/2018    History of present illness Pt is a 73 y.o. male admitted 02/27/18 with L-sided weakness while at dialysis. MRI shows small volume diffusion abnormality involving parasagittal cerebral hemispheres, additional L frontal cortical involvement compatible with acute ischemia; remote bilat cerebellar infarcts. Pt with 2x NSTEMIs since admission. S/p PCI of LAD on 2/21. 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 s/p left ICA stent placement 03/20/18. PMH includes HTN, ESRD on HD.   OT comments  Pt not progressing toward OT goals this session d/t lethargy and generalized weakness following HD session this AM. Pt reporting he is "not feeling like himself" and is noticeably less chatty than usual. Pt required min guard for standing level grooming tasks and remained on 2L [place] throughout session. OT will continue to follow.    Follow Up Recommendations  Home health OT;Supervision - Intermittent    Equipment Recommendations  None recommended by OT       Precautions / Restrictions Precautions Precautions: Fall Precaution Comments: Inattention to left side (body and environment); watch Sp02 Restrictions Weight Bearing Restrictions: No       Mobility Bed Mobility Overal bed mobility: Needs Assistance Bed Mobility: Supine to Sit     Supine to sit: Modified independent (Device/Increase time);HOB elevated     General bed mobility comments: Pt recevied from PT sitting EOB  Transfers Overall transfer level: Needs assistance Equipment used: None Transfers: Sit to/from Stand Sit to Stand: Min guard;Min assist         General transfer comment: Min guard assist-Min A for standing balance, slow to rise. no dizziness.     Balance Overall balance assessment: Needs  assistance Sitting-balance support: No upper extremity supported;Feet supported Sitting balance-Leahy Scale: Fair     Standing balance support: During functional activity Standing balance-Leahy Scale: Fair Standing balance comment: pt is able to static stand without UE support and adjust jeans/donn belt but requires UE support for walking due to LOB.                           ADL either performed or assessed with clinical judgement   ADL Overall ADL's : Needs assistance/impaired     Grooming: Min guard;Standing;Oral care;Wash/dry face;Wash/dry hands Grooming Details (indicate cue type and reason): min guard during standing level oral care task. Pt locating all items appropriately in all quadrants                             Functional mobility during ADLs: Min guard General ADL Comments: Pt more unsteady this session, several slight LOB, min guard and occasional min A                Cognition Arousal/Alertness: Lethargic Behavior During Therapy: Flat affect Overall Cognitive Status: Impaired/Different from baseline Area of Impairment: Problem solving;Memory                   Current Attention Level: Selective Memory: Decreased short-term memory Following Commands: Follows multi-step commands with increased time Safety/Judgement: Decreased awareness of safety Awareness: Emergent Problem Solving: Slow processing;Requires verbal cues General Comments: Reports not thinking straight today, unable to fully express the way he is feeling. Pt is known to this OT and is noticably different than usual  General Comments RN alerted to AMS    Pertinent Vitals/ Pain       Pain Assessment: No/denies pain         Frequency  Min 2X/week        Progress Toward Goals  OT Goals(current goals can now be found in the care plan section)  Progress towards OT goals: Not progressing toward goals - comment(Pt limited by lethargy this  session)  Acute Rehab OT Goals Patient Stated Goal: Return to normal OT Goal Formulation: With patient/family Time For Goal Achievement: 04/02/18 Potential to Achieve Goals: Good  Plan Discharge plan remains appropriate       AM-PAC OT "6 Clicks" Daily Activity     Outcome Measure   Help from another person eating meals?: A Little Help from another person taking care of personal grooming?: A Little Help from another person toileting, which includes using toliet, bedpan, or urinal?: A Little Help from another person bathing (including washing, rinsing, drying)?: A Little Help from another person to put on and taking off regular upper body clothing?: A Little Help from another person to put on and taking off regular lower body clothing?: A Little 6 Click Score: 18    End of Session Equipment Utilized During Treatment: Oxygen  OT Visit Diagnosis: Unsteadiness on feet (R26.81);Muscle weakness (generalized) (M62.81) Hemiplegia - Right/Left: Left Hemiplegia - dominant/non-dominant: Non-Dominant   Activity Tolerance Patient limited by fatigue;Patient limited by lethargy   Patient Left in chair;with call bell/phone within reach   Nurse Communication Mobility status        Time: 1546-1600 OT Time Calculation (min): 14 min  Charges: OT General Charges $OT Visit: 1 Visit OT Treatments $Self Care/Home Management : 8-22 mins   Curtis Sites OTR/L  03/23/2018, 4:32 PM

## 2018-03-23 NOTE — Progress Notes (Signed)
[facility] Progress Note   Assessment/ Plan:   [facility]  MWF 3.5h R IJ TDC 149lbs (67.5kg)  Hep 2700 then 500u/hr - hect 1.5 ug IV tiw - ferrlicit - no esa   Assessment/Plan: 1. 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 Brilinta/ asa.  2. Thrombocytopenia, acute - 278 --> 46 (3/7), now up to 81 today.  Will order sodium citrate locks for TDC.  No schistocytes on smear, INR 1.2, fibrinogen WNL, not likely DIC or TTP, ? Effect of aggrastat, per primary   3. NSTEMI- s/p PCI of LAD on 03/06/18. Per primary.  4. ESRDon HD qMWF. HD Monday, no hep with low plts. Not sure what perm access situation is, has TDC.  5. HTN/volume: on losartan 100/ metop xl 200 qd/ amlodipine 5 > 10/qd. BP's still up, UF goal 4L today. 6. Anemia of ckd/ blood draws/ guiac+: Hb 13 >> 7.9 here, was not getting esa at OP unit; got darbe 60 ug here 2/23 and 2/28, then 100ug 3/7 today. Tsat 22% 2/24 - getting 500mg  load of Fe here.  Got 1u prbc 3/5 and another 1 u pRBCs 3/7.   7. Anemia/ Guiac+ - not active bleed, seen by GI 2/28, no intervention now given recent MI/ stent 8. CKD-MBD:stable 9. Nutrition:renal diet 10. Disposition-  for dc home when ready  Subjective:    Seen on HD.  Feeling OK.  No complaints at this time.     Objective:   BP (!) 196/89   Pulse 79   Temp 98.6 F (37 C) (Oral)   Resp (!) 23   Ht 6\' 3"  (1.905 m)   Wt 70 kg   SpO2 90%   BMI 19.29 kg/m   Physical Exam: Gen: chronically ill-appearing, NAD CVS: RRR with loud S2 Resp: clear bilaterally Abd: soft Ext: no LE edema ACCESS: R IJ TDC  Labs: BMET Recent Labs  Lab 03/17/18 0054 03/18/18 0500 03/19/18 0518 03/20/18 0611 03/21/18 0423 03/22/18 1217 03/23/18 0532  NA 135 132* 134* 133* 132* 134* 134*  K 4.5 4.5 3.9 4.5 4.9 3.9 4.3  CL 97* 97* 96* 95* 97* 96* 98  CO2 25 24 25 22  17* 24 20*  GLUCOSE 114* 124* 130* 111* 135* 166* 130*  BUN 24* 39*  27* 42* 52* 33* 42*  CREATININE 7.83* 10.84* 8.03* 10.38* 11.86* 8.85* 10.70*  CALCIUM 8.2* 8.3* 8.2* 9.1 8.0* 8.3* 8.3*  PHOS  --  4.3 3.8  --  5.2*  --   --    CBC Recent Labs  Lab 03/17/18 0054  03/20/18 0611 03/21/18 0423 03/21/18 0815 03/22/18 1217 03/23/18 0532  WBC 10.5   < > 11.0* 9.5 8.1 9.2 10.8*  NEUTROABS 6.0  --  5.6 6.1  --   --   --   HGB 8.3*   < > 10.5* 7.8* 8.2* 8.9* 8.9*  HCT 25.6*   < > 31.7* 22.8* 23.7* 26.3* 27.4*  MCV 91.1   < > 91.6 91.9 92.6 89.8 90.7  PLT 301   < > 278 46* 49* 68*  67* 81*   < > = values in this interval not displayed.    @IMGRELPRIORS @ Medications:    . amLODipine  10 mg Oral QHS  . aspirin  81 mg Oral Daily   Or  . aspirin  81 mg Per Tube Daily  . atorvastatin  40 mg Oral q1800  . azelastine  1 spray Each Nare BID  . Chlorhexidine Gluconate Cloth  6 each Topical Q0600  . darbepoetin (ARANESP) injection - DIALYSIS  100 mcg Intravenous Q Fri-HD  . famotidine  20 mg Oral QHS  . fluticasone  2 spray Each Nare BID  . isosorbide mononitrate  60 mg Oral Daily  . loratadine  10 mg Oral Q48H  . losartan  100 mg Oral Daily  . metoprolol succinate  200 mg Oral Daily  . multivitamin  1 tablet Oral Daily  . polyethylene glycol  17 g Oral Daily  . senna-docusate  1 tablet Oral BID  . ticagrelor  45 mg Oral BID     Madelon Lips, MD [place] pgr [phone] 03/23/2018, 8:46 AM

## 2018-03-23 NOTE — Progress Notes (Addendum)
PROGRESS NOTE    Peter [name]  ZOX:[id number] DOB: 27-Oct-1945 DOA: 02/27/2018 PCP: Patient, No Pcp Per   Brief Narrative:  HPI On 02/27/2018 by Dr. Shela Leff Peter [name] is a 73 y.o. male with a past medical history of ESRD on HD, hypertension being brought to the hospital from his dialysis center due to strokelike symptoms with left sided weakness. Last known normal 3:15 this afternoon.  Patient states after finishing dialysis today he felt weak and could not get out of the chair.  Both of his legs felt weak and his right leg was shaking.  He could not lift his arms.  States he was told that his left arm was not moving.  Denies any history of prior stroke.  He is a former smoker; quit 4 years ago.  He was previously smoking 1/2 pack/day for about 20 years.  He is complaining of a headache.  Denies having any chest pain.  No additional history could be obtained from the patient.  Interim history Patient presented to the hospital with left-sided weakness after dialysis on 02/27/2018 and was found to have an acute stroke, neurology consulted.  Also noted to have an end STEMI on 03/04/2018 with chest pain.  Status post DES.  S/p Left ICA stent placement.  Assessment & Plan   Acute CVA -Embolic, unknown source -Patient presented with left-sided weakness after dialysis he did not receive TPA due to mild deficits -Neurology consulted and appreciated -Cerebral arteriogram showed 90% stenosis of the proximal left ICA.  Left ICA stent will be placed on 03/20/2018 by interventional radiology. -Echocardiogram as listed below -Continue aspirin, brilinta, statin  Left ICA stenosis -IR consulted, planning for procedure today 03/20/2018 -Dr. Estanislado Pandy recommending discontinuing Plavix, and starting Brilinta and has been following P2Y12  -s/p cerebral angiogram- stent assisted angioplasty of high grade stenosis of prox L ICA with distal protection -appears patient was given aggrastat and heparin-  heparin now discontinued -Per IR- follow up in the clinic in 2 weeks -Discussed with Dr. Estanislado Pandy, will decrease dose of Birlinta to 45mg  BID until platelets rebound  Acute thrombocytopenia  -Platelets dropped to 46; improving to 81 today -possibly secondary Aggrastat -heparin has been discontinued-unlikely HIT given that patient has had heparin exposure with dialysis -DIC panel obtained: DDimer 2.22 fibrinogen 541, INR WNL no schistocytes on smear -Will not obtain CTA chest or LE doppler- asymptomatic- effects likely due to aggrastat -continue to monitor CBC  NSTEMI -Patient did have chest pain the night of 03/03/2018 03/04/2018, with a peak troponin of 6.4 -Cardiology consulted and appreciated -Status post cardiac catheterization on 03/06/2018 with PCI/DES x1 to the LAD with scoring balloon to ISR in the ostium of OM1.  Continue to treat RCA disease medically. -Patient will need DAPT with aspirin and Plavix for at least 1 year once done with Brilinta as per interventional radiology recommendation -Cardiology recommended 30-day event monitor with TEE/possible ICD evaluation on outpatient basis through his primary cardiologist, Dr. Beatrix Fetters -Of note, TEE was attempted but aborted on 03/03/2018, as patient was unable to be sedated adequately -No further complaints of chest pain  Chronic systolic congestive heart failure -Echocardiogram on 02/28/2018 showed an EF of 30 to 35%, cavity size mildly dilated.  Diffuse hypokinesis.  Impaired relaxation of left ventricle -Continue volume management with dialysis -Follow-up with cardiology as an outpatient  ESRD -Patient dialyzes Monday Wednesday Friday -Nephrology consulted and appreciated  Anemia of chronic disease -FOBT was positive however does not appear to be actively bleeding. -Previous  hospitalist discussed with gastroenterologist, Dr. Silverio Decamp on 03/13/2018, given his recent MI and stent placement, heme positive, would recommend outpatient  follow-up for colonoscopy -Hemoglobin dropped to 7.2, given 1u PRBC on 03/19/2018 -given additional unit on 03/20/2000 with HD -hemoglobin currently stable, 8.9  Right renal mass -Incidental finding from abdominal ultrasound on 02/28/2018: Small mass in the lower pole of the right kidney, could reflect complex cyst or solid lesion.  Noncontrast MRI best available imaging option. -Will need to follow-up as an outpatient  Nasal congestion -Continue Claritin, Flonase  Essential hypertension -Stable, continue amlodipine, losartan, metoprolol  Failure to thrive/physical deconditioning  -Likely due to prolonged hospitalization -Patient has progressed with physical therapy, now recommending home health  DVT Prophylaxis  SCDs  Code Status: Full  Family Communication: None at bedside  Disposition Plan: Admitted. Pending improvement/stabilization in hemoglobin and platelets. Dispo likely home with [facility]  Consultants Neurology Interventional radiology Nephrology Gastroenterology Inpatient rehab Cardiology  Procedures  Cardiogram Cardiac catheterization Cerebral angiogram  Antibiotics   Anti-infectives (From admission, onward)   Start     Dose/Rate Route Frequency Ordered Stop   03/20/18 0000  ceFAZolin (ANCEF) IVPB 2g/100 mL premix     2 g 200 mL/hr over 30 Minutes Intravenous 60 min pre-op 03/18/18 0112 03/20/18 1155   03/17/18 0600  ceFAZolin (ANCEF) IVPB 2g/100 mL premix  Status:  Discontinued     2 g 200 mL/hr over 30 Minutes Intravenous To Short Stay 03/16/18 0729 03/17/18 0950      Subjective:   [name] seen and examined today.  Seen in dialysis. No complaints today.  Denies chest pain, feels breathing is improved.  Denies bloating sensation.  Denies abdominal pain, nausea or vomiting, diarrhea constipation.  Objective:   Vitals:   03/23/18 0657 03/23/18 0730 03/23/18 0800 03/23/18 0830  BP: (!) 185/77 (!) 183/89 (!) 178/77 (!) 196/89  Pulse: 77 84 78 79  Resp:  (!) 24 (!) 22 15 (!) 23  Temp:      TempSrc:      SpO2:      Weight:      Height:        Intake/Output Summary (Last 24 hours) at 03/23/2018 0932 Last data filed at 03/22/2018 1830 Gross per 24 hour  Intake 600 ml  Output -  Net 600 ml   Filed Weights   03/21/18 0705 03/21/18 1100 03/23/18 0650  Weight: 71.9 kg 68.3 kg 70 kg   Exam  General: Well developed, thin, NAD  HEENT: NCAT, mucous membranes moist.   Neck: Supple  Cardiovascular: S1 S2 auscultated, RRR  Respiratory: Clear to auscultation bilaterally   Abdomen: Soft, nontender, nondistended, + bowel sounds  Extremities: warm dry without cyanosis clubbing or edema  Neuro: AAOx3, nonfocal  Psych: Appropriate mood and affect  Data Reviewed: I have personally reviewed following labs and imaging studies  CBC: Recent Labs  Lab 03/17/18 0054  03/20/18 0611 03/21/18 0423 03/21/18 0815 03/22/18 1217 03/23/18 0532  WBC 10.5   < > 11.0* 9.5 8.1 9.2 10.8*  NEUTROABS 6.0  --  5.6 6.1  --   --   --   HGB 8.3*   < > 10.5* 7.8* 8.2* 8.9* 8.9*  HCT 25.6*   < > 31.7* 22.8* 23.7* 26.3* 27.4*  MCV 91.1   < > 91.6 91.9 92.6 89.8 90.7  PLT 301   < > 278 46* 49* 68*  67* 81*   < > = values in this interval not displayed.  Basic Metabolic Panel: Recent Labs  Lab 03/18/18 0500 03/19/18 0518 03/20/18 0611 03/21/18 0423 03/22/18 1217 03/23/18 0532  NA 132* 134* 133* 132* 134* 134*  K 4.5 3.9 4.5 4.9 3.9 4.3  CL 97* 96* 95* 97* 96* 98  CO2 24 25 22  17* 24 20*  GLUCOSE 124* 130* 111* 135* 166* 130*  BUN 39* 27* 42* 52* 33* 42*  CREATININE 10.84* 8.03* 10.38* 11.86* 8.85* 10.70*  CALCIUM 8.3* 8.2* 9.1 8.0* 8.3* 8.3*  PHOS 4.3 3.8  --  5.2*  --   --    GFR: Estimated Creatinine Clearance: 6.4 mL/min (A) (by C-G formula based on SCr of 10.7 mg/dL (H)). Liver Function Tests: Recent Labs  Lab 03/18/18 0500 03/19/18 0518 03/21/18 0423  ALBUMIN 2.9* 2.7* 2.7*   No results for input(s): LIPASE, AMYLASE in the last  168 hours. No results for input(s): AMMONIA in the last 168 hours. Coagulation Profile: Recent Labs  Lab 03/17/18 0054 03/22/18 1217  INR 1.2 1.2   Cardiac Enzymes: No results for input(s): CKTOTAL, CKMB, CKMBINDEX, TROPONINI in the last 168 hours. BNP (last 3 results) No results for input(s): PROBNP in the last 8760 hours. HbA1C: No results for input(s): HGBA1C in the last 72 hours. CBG: Recent Labs  Lab 03/18/18 1105  GLUCAP 95   Lipid Profile: No results for input(s): CHOL, HDL, LDLCALC, TRIG, CHOLHDL, LDLDIRECT in the last 72 hours. Thyroid Function Tests: No results for input(s): TSH, T4TOTAL, FREET4, T3FREE, THYROIDAB in the last 72 hours. Anemia Panel: No results for input(s): VITAMINB12, FOLATE, FERRITIN, TIBC, IRON, RETICCTPCT in the last 72 hours. Urine analysis: No results found for: COLORURINE, APPEARANCEUR, LABSPEC, PHURINE, GLUCOSEU, HGBUR, BILIRUBINUR, KETONESUR, PROTEINUR, UROBILINOGEN, NITRITE, LEUKOCYTESUR Sepsis Labs: @LABRCNTIP (procalcitonin:4,lacticidven:4)  )No results found for this or any previous visit (from the past 240 hour(s)).    Radiology Studies: No results found.   Scheduled Meds: . amLODipine  10 mg Oral QHS  . aspirin  81 mg Oral Daily   Or  . aspirin  81 mg Per Tube Daily  . atorvastatin  40 mg Oral q1800  . azelastine  1 spray Each Nare BID  . Chlorhexidine Gluconate Cloth  6 each Topical Q0600  . darbepoetin (ARANESP) injection - DIALYSIS  100 mcg Intravenous Q Fri-HD  . famotidine  20 mg Oral QHS  . fluticasone  2 spray Each Nare BID  . isosorbide mononitrate  60 mg Oral Daily  . loratadine  10 mg Oral Q48H  . losartan  100 mg Oral Daily  . metoprolol succinate  200 mg Oral Daily  . multivitamin  1 tablet Oral Daily  . polyethylene glycol  17 g Oral Daily  . senna-docusate  1 tablet Oral BID  . ticagrelor  45 mg Oral BID   Continuous Infusions: . sodium chloride 10 mL/hr at 03/20/18 1101  . anticoagulant sodium citrate     . ferric gluconate (FERRLECIT/NULECIT) IV 125 mg (03/21/18 1005)     LOS: 23 days   Time Spent in minutes   30 minutes  Antoria Lanza D.O. on 03/23/2018 at 9:32 AM  Between 7am to 7pm - Please see pager noted on [email or web address]  After 7pm go to [email or web address]  And look for the night coverage person covering for me after hours  [facility] Office  [phone]

## 2018-03-23 NOTE — Progress Notes (Signed)
PT Cancellation Note  Patient Details Name: Peter [name] MRN: [id number] DOB: 15-Nov-1945   Cancelled Treatment:    Reason Eval/Treat Not Completed: Patient at procedure or test/unavailable Pt off floor at HD. Will follow up as time allows.   Marguarite Arbour A Annabeth Tortora 03/23/2018, 7:28 AM Wray Kearns, PT, DPT Acute Rehabilitation Services Pager [phone] Office [phone]

## 2018-03-24 ENCOUNTER — Inpatient Hospital Stay (HOSPITAL_COMMUNITY): Payer: Medicare HMO

## 2018-03-24 ENCOUNTER — Encounter (HOSPITAL_COMMUNITY): Payer: Self-pay | Admitting: Interventional Radiology

## 2018-03-24 DIAGNOSIS — R0602 Shortness of breath: Secondary | ICD-10-CM

## 2018-03-24 LAB — CBC
HCT: 28.2 % — ABNORMAL LOW (ref 39.0–52.0)
Hemoglobin: 9.1 g/dL — ABNORMAL LOW (ref 13.0–17.0)
MCH: 29.4 pg (ref 26.0–34.0)
MCHC: 32.3 g/dL (ref 30.0–36.0)
MCV: 91.3 fL (ref 80.0–100.0)
NRBC: 0.2 % (ref 0.0–0.2)
Platelets: 105 10*3/uL — ABNORMAL LOW (ref 150–400)
RBC: 3.09 MIL/uL — ABNORMAL LOW (ref 4.22–5.81)
RDW: 16.1 % — ABNORMAL HIGH (ref 11.5–15.5)
WBC: 8.9 10*3/uL (ref 4.0–10.5)

## 2018-03-24 LAB — RENAL FUNCTION PANEL
ALBUMIN: 2.6 g/dL — AB (ref 3.5–5.0)
Anion gap: 13 (ref 5–15)
BUN: 26 mg/dL — ABNORMAL HIGH (ref 8–23)
CO2: 26 mmol/L (ref 22–32)
Calcium: 8.3 mg/dL — ABNORMAL LOW (ref 8.9–10.3)
Chloride: 97 mmol/L — ABNORMAL LOW (ref 98–111)
Creatinine, Ser: 7.95 mg/dL — ABNORMAL HIGH (ref 0.61–1.24)
GFR calc Af Amer: 7 mL/min — ABNORMAL LOW (ref >60)
GFR, EST NON AFRICAN AMERICAN: 6 mL/min — AB (ref >60)
Glucose, Bld: 130 mg/dL — ABNORMAL HIGH (ref 70–99)
PHOSPHORUS: 4.5 mg/dL (ref 2.5–4.6)
Potassium: 3.7 mmol/L (ref 3.5–5.1)
Sodium: 136 mmol/L (ref 135–145)

## 2018-03-24 MED ORDER — TICAGRELOR 90 MG PO TABS
90.0000 mg | ORAL_TABLET | Freq: Two times a day (BID) | ORAL | Status: DC
  Administered 2018-03-24 – 2018-03-29 (×11): 90 mg via ORAL
  Filled 2018-03-24 (×11): qty 1

## 2018-03-24 MED ORDER — IOHEXOL 350 MG/ML SOLN
100.0000 mL | Freq: Once | INTRAVENOUS | Status: AC | PRN
  Administered 2018-03-24: 80 mL via INTRAVENOUS

## 2018-03-24 NOTE — Progress Notes (Signed)
PROGRESS NOTE    Peter [name]  JSH:[id number] DOB: Jul 09, 1945 DOA: 02/27/2018 PCP: Patient, No Pcp Per   Brief Narrative:  HPI On 02/27/2018 by Dr. Shela Leff Kirby [name] is a 73 y.o. male with a past medical history of ESRD on HD, hypertension being brought to the hospital from his dialysis center due to strokelike symptoms with left sided weakness. Last known normal 3:15 this afternoon.  Patient states after finishing dialysis today he felt weak and could not get out of the chair.  Both of his legs felt weak and his right leg was shaking.  He could not lift his arms.  States he was told that his left arm was not moving.  Denies any history of prior stroke.  He is a former smoker; quit 4 years ago.  He was previously smoking 1/2 pack/day for about 20 years.  He is complaining of a headache.  Denies having any chest pain.  No additional history could be obtained from the patient.  Interim history Patient presented to the hospital with left-sided weakness after dialysis on 02/27/2018 and was found to have an acute stroke, neurology consulted.  Also noted to have an end STEMI on 03/04/2018 with chest pain.  Status post DES.  S/p Left ICA stent placement. Now with increased oxygen demand, obtaining CTA chest. Assessment & Plan   Acute CVA -Embolic, unknown source -Patient presented with left-sided weakness after dialysis he did not receive TPA due to mild deficits -Neurology consulted and appreciated -Cerebral arteriogram showed 90% stenosis of the proximal left ICA.  Left ICA stent will be placed on 03/20/2018 by interventional radiology. -Echocardiogram as listed below -Continue aspirin, brilinta, statin  Left ICA stenosis -IR consulted, planning for procedure today 03/20/2018 -Dr. Estanislado Pandy recommending discontinuing Plavix, and starting Brilinta and has been following P2Y12  -s/p cerebral angiogram- stent assisted angioplasty of high grade stenosis of prox L ICA with distal  protection -appears patient was given aggrastat and heparin- heparin now discontinued -Per IR- follow up in the clinic in 2 weeks -Discussed with Dr. Estanislado Pandy, will decrease dose of Brilinta to 45mg  BID until platelets rebound -platelets are improving today, 105 -will increase Brilinta dose to 90mg  BID  Acute thrombocytopenia  -Platelets dropped to 46; improving to 105 today -possibly secondary Aggrastat -heparin has been discontinued-unlikely HIT given that patient has had heparin exposure with dialysis -DIC panel obtained: DDimer 2.22 fibrinogen 541, INR WNL no schistocytes on smear -continue to monitor CBC  Hypoxia -DDimer was elevated 2.22 -Patient has been placed on 3L of O2 for supposed O2 saturation of 86% this morning (however, not documented) -Discussed with nephrology, will obtain CTA chest to rule out PE and LE doppler -Continue supplemental O2 to maintain O2 saturations above >92% -on exam, lungs are clear  NSTEMI -Patient did have chest pain the night of 03/03/2018 03/04/2018, with a peak troponin of 6.4 -Cardiology consulted and appreciated -Status post cardiac catheterization on 03/06/2018 with PCI/DES x1 to the LAD with scoring balloon to ISR in the ostium of OM1.  Continue to treat RCA disease medically. -Patient will need DAPT with aspirin and Plavix for at least 1 year once done with Brilinta as per interventional radiology recommendation -Cardiology recommended 30-day event monitor with TEE/possible ICD evaluation on outpatient basis through his primary cardiologist, Dr. Beatrix Fetters -Of note, TEE was attempted but aborted on 03/03/2018, as patient was unable to be sedated adequately -No further complaints of chest pain  Chronic systolic congestive heart failure -Echocardiogram on 02/28/2018 showed  an EF of 30 to 35%, cavity size mildly dilated.  Diffuse hypokinesis.  Impaired relaxation of left ventricle -Continue volume management with dialysis -Follow-up with cardiology  as an outpatient  ESRD -Patient dialyzes Monday Wednesday Friday -Nephrology consulted and appreciated  Anemia of chronic disease -FOBT was positive however does not appear to be actively bleeding. -Previous hospitalist discussed with gastroenterologist, Dr. Silverio Decamp on 03/13/2018, given his recent MI and stent placement, heme positive, would recommend outpatient follow-up for colonoscopy -received 2u PRBC in the past few days -hemoglobin currently stable, 9.1 -Continue to monitor CBC  Right renal mass -Incidental finding from abdominal ultrasound on 02/28/2018: Small mass in the lower pole of the right kidney, could reflect complex cyst or solid lesion.  Noncontrast MRI best available imaging option. -Will need to follow-up as an outpatient  Nasal congestion -Continue Claritin, Flonase  Essential hypertension -Stable, continue amlodipine, losartan, metoprolol  Failure to thrive/physical deconditioning  -Likely due to prolonged hospitalization -Patient has progressed with physical therapy, now recommending home health  DVT Prophylaxis  SCDs  Code Status: Full  Family Communication: None at bedside.   Disposition Plan: Admitted. Pending improvement in oxygenation.  Dispo likely home with HH.  Consultants Neurology Interventional radiology Nephrology Gastroenterology Inpatient rehab Cardiology  Procedures  Cardiogram Cardiac catheterization Cerebral angiogram  Antibiotics   Anti-infectives (From admission, onward)   Start     Dose/Rate Route Frequency Ordered Stop   03/20/18 0000  ceFAZolin (ANCEF) IVPB 2g/100 mL premix     2 g 200 mL/hr over 30 Minutes Intravenous 60 min pre-op 03/18/18 0112 03/20/18 1155   03/17/18 0600  ceFAZolin (ANCEF) IVPB 2g/100 mL premix  Status:  Discontinued     2 g 200 mL/hr over 30 Minutes Intravenous To Short Stay 03/16/18 0729 03/17/18 0950      Subjective:   [name] seen and examined today.  Currently sitting up in chair.  Concerned that he is needing oxygen. Denies current shortness of breath, chest pain, abdominal pain, nausea or vomiting, diarrhea or constipation, dizziness, headache.  Objective:   Vitals:   03/23/18 2015 03/23/18 2330 03/24/18 0320 03/24/18 0818  BP: (!) 157/64 (!) 150/50 (!) 148/69 (!) 149/57  Pulse: 81 65 68 72  Resp: 18 18 18 20   Temp: 98.2 F (36.8 C) 98.5 F (36.9 C) 98.5 F (36.9 C) 98.7 F (37.1 C)  TempSrc: Oral Oral Oral Oral  SpO2: 100% 96% 98% 96%  Weight:      Height:        Intake/Output Summary (Last 24 hours) at 03/24/2018 0944 Last data filed at 03/23/2018 1039 Gross per 24 hour  Intake -  Output 3500 ml  Net -3500 ml   Filed Weights   03/21/18 1100 03/23/18 0650 03/23/18 1039  Weight: 68.3 kg 70 kg 66 kg   Exam  General: Well developed, chronically ill appearing, thin, NAD  HEENT: NCAT, mucous membranes moist.   Cardiovascular: S1 S2 auscultated, RRR  Respiratory: Diminished but clear, no wheezing   Abdomen: Soft, nontender, nondistended, + bowel sounds  Extremities: warm dry without cyanosis clubbing or edema  Neuro: AAOx3, nonfocal  Psych: Normal affect and demeanor   Data Reviewed: I have personally reviewed following labs and imaging studies  CBC: Recent Labs  Lab 03/20/18 0611 03/21/18 0423 03/21/18 0815 03/22/18 1217 03/23/18 0532 03/24/18 0406  WBC 11.0* 9.5 8.1 9.2 10.8* 8.9  NEUTROABS 5.6 6.1  --   --   --   --   HGB 10.5*  7.8* 8.2* 8.9* 8.9* 9.1*  HCT 31.7* 22.8* 23.7* 26.3* 27.4* 28.2*  MCV 91.6 91.9 92.6 89.8 90.7 91.3  PLT 278 46* 49* 68*  67* 81* 811*   Basic Metabolic Panel: Recent Labs  Lab 03/18/18 0500 03/19/18 0518 03/20/18 0611 03/21/18 0423 03/22/18 1217 03/23/18 0532 03/24/18 0406  NA 132* 134* 133* 132* 134* 134* 136  K 4.5 3.9 4.5 4.9 3.9 4.3 3.7  CL 97* 96* 95* 97* 96* 98 97*  CO2 24 25 22  17* 24 20* 26  GLUCOSE 124* 130* 111* 135* 166* 130* 130*  BUN 39* 27* 42* 52* 33* 42* 26*  CREATININE  10.84* 8.03* 10.38* 11.86* 8.85* 10.70* 7.95*  CALCIUM 8.3* 8.2* 9.1 8.0* 8.3* 8.3* 8.3*  PHOS 4.3 3.8  --  5.2*  --   --  4.5   GFR: Estimated Creatinine Clearance: 8.1 mL/min (A) (by C-G formula based on SCr of 7.95 mg/dL (H)). Liver Function Tests: Recent Labs  Lab 03/18/18 0500 03/19/18 0518 03/21/18 0423 03/24/18 0406  ALBUMIN 2.9* 2.7* 2.7* 2.6*   No results for input(s): LIPASE, AMYLASE in the last 168 hours. No results for input(s): AMMONIA in the last 168 hours. Coagulation Profile: Recent Labs  Lab 03/22/18 1217  INR 1.2   Cardiac Enzymes: No results for input(s): CKTOTAL, CKMB, CKMBINDEX, TROPONINI in the last 168 hours. BNP (last 3 results) No results for input(s): PROBNP in the last 8760 hours. HbA1C: No results for input(s): HGBA1C in the last 72 hours. CBG: Recent Labs  Lab 03/18/18 1105 03/23/18 1051  GLUCAP 95 100*   Lipid Profile: No results for input(s): CHOL, HDL, LDLCALC, TRIG, CHOLHDL, LDLDIRECT in the last 72 hours. Thyroid Function Tests: No results for input(s): TSH, T4TOTAL, FREET4, T3FREE, THYROIDAB in the last 72 hours. Anemia Panel: No results for input(s): VITAMINB12, FOLATE, FERRITIN, TIBC, IRON, RETICCTPCT in the last 72 hours. Urine analysis: No results found for: COLORURINE, APPEARANCEUR, LABSPEC, PHURINE, GLUCOSEU, HGBUR, BILIRUBINUR, KETONESUR, PROTEINUR, UROBILINOGEN, NITRITE, LEUKOCYTESUR Sepsis Labs: @LABRCNTIP (procalcitonin:4,lacticidven:4)  )No results found for this or any previous visit (from the past 240 hour(s)).    Radiology Studies: No results found.   Scheduled Meds: . amLODipine  10 mg Oral QHS  . aspirin  81 mg Oral Daily   Or  . aspirin  81 mg Per Tube Daily  . atorvastatin  40 mg Oral q1800  . azelastine  1 spray Each Nare BID  . Chlorhexidine Gluconate Cloth  6 each Topical Q0600  . darbepoetin (ARANESP) injection - DIALYSIS  100 mcg Intravenous Q Fri-HD  . famotidine  20 mg Oral QHS  . fluticasone  2  spray Each Nare BID  . isosorbide mononitrate  60 mg Oral Daily  . loratadine  10 mg Oral Q48H  . losartan  100 mg Oral Daily  . metoprolol succinate  200 mg Oral Daily  . multivitamin  1 tablet Oral Daily  . polyethylene glycol  17 g Oral Daily  . senna-docusate  1 tablet Oral BID  . ticagrelor  90 mg Oral BID   Continuous Infusions: . sodium chloride 10 mL/hr at 03/20/18 1101  . anticoagulant sodium citrate       LOS: 24 days   Time Spent in minutes   45 minutes (greater than 50% of time spent with patient face to face, as well as reviewing records, calling consults, and formulating a plan)   Cristal Ford D.O. on 03/24/2018 at 9:44 AM  Between 7am to 7pm - Please see  pager noted on [email or web address]  After 7pm go to [email or web address]  And look for the night coverage person covering for me after hours  [facility] Office  [phone]

## 2018-03-24 NOTE — Progress Notes (Signed)
[facility] Progress Note   Assessment/ Plan:   [facility]  MWF 3.5h R IJ TDC 149lbs (67.5kg)  Hep 2700 then 500u/hr - hect 1.5 ug IV tiw - ferrlicit - no esa   Assessment/Plan: 1. Acute CVA- 81% stenosis prox LICA > sp L ICA stent 03/20/18 by [name]. Brilinta/ asa.  2. Thrombocytopenia, acute - 278 --> 46 (3/7), now up to 81--> 105 .  Will order sodium citrate locks for TDC.  No schistocytes on smear, INR 1.2, fibrinogen WNL, not likely DIC or TTP, ? Effect of aggrastat, per primary   3. NSTEMI- s/p PCI of LAD on 03/06/18. Per primary.  4. ESRDon HD qMWF. HD Monday, no hep with low plts. Not sure what perm access situation is, has TDC.  5. HTN/volume: on losartan 100/ metop xl 200 qd/ amlodipine 5 > 10/qd. BP's still up, UF goal 4L today. 6. Anemia of ckd/ blood draws/ guiac+: Hb 13 >> 7.9 here, was not getting esa at OP unit; got darbe 60 ug here 2/23 and 2/28, then 100ug 3/7 today. Tsat 22% 2/24 - getting 500mg  load of Fe here.  Got 1u prbc 3/5 and another 1 u pRBCs 3/7.   7. Anemia/ Guiac+ - not active bleed, seen by GI 2/28, no intervention now given recent MI/ stent 8. CKD-MBD:stable 9. Nutrition:renal diet 10. Hypoxia/AMS- appears improved after HD.  D/c'd benadryl in case contributory to AMS.  D/w primary, given elevated Ddimer and repeated hypoxia will r/o PE,. 11. Disposition-  for dc home when ready  Subjective:    Pt sitting up in chair this AM- doesn't recall being in HD yesterday or seeing this Probation officer.  Per RN, has been desatting repeatedly.  D-dimer elevated.     Objective:   BP (!) 149/57 (BP Location: Right Arm)   Pulse 72   Temp 98.7 F (37.1 C) (Oral)   Resp 20   Ht 6\' 3"  (1.905 m)   Wt 66 kg Comment: standing weight  SpO2 100%   BMI 18.19 kg/m   Physical Exam: Gen: chronically ill-appearing, NAD, sitting up in chair, alert and talkative CVS: RRR with loud S2 Resp: clear bilaterally to the bases Abd:  soft Ext: no LE edema ACCESS: R IJ TDC  Labs: BMET Recent Labs  Lab 03/18/18 0500 03/19/18 0518 03/20/18 0611 03/21/18 0423 03/22/18 1217 03/23/18 0532 03/24/18 0406  NA 132* 134* 133* 132* 134* 134* 136  K 4.5 3.9 4.5 4.9 3.9 4.3 3.7  CL 97* 96* 95* 97* 96* 98 97*  CO2 24 25 22  17* 24 20* 26  GLUCOSE 124* 130* 111* 135* 166* 130* 130*  BUN 39* 27* 42* 52* 33* 42* 26*  CREATININE 10.84* 8.03* 10.38* 11.86* 8.85* 10.70* 7.95*  CALCIUM 8.3* 8.2* 9.1 8.0* 8.3* 8.3* 8.3*  PHOS 4.3 3.8  --  5.2*  --   --  4.5   CBC Recent Labs  Lab 03/20/18 0611 03/21/18 0423 03/21/18 0815 03/22/18 1217 03/23/18 0532 03/24/18 0406  WBC 11.0* 9.5 8.1 9.2 10.8* 8.9  NEUTROABS 5.6 6.1  --   --   --   --   HGB 10.5* 7.8* 8.2* 8.9* 8.9* 9.1*  HCT 31.7* 22.8* 23.7* 26.3* 27.4* 28.2*  MCV 91.6 91.9 92.6 89.8 90.7 91.3  PLT 278 46* 49* 68*  67* 81* 105*    @IMGRELPRIORS @ Medications:    . amLODipine  10 mg Oral QHS  . aspirin  81 mg Oral Daily  Or  . aspirin  81 mg Per Tube Daily  . atorvastatin  40 mg Oral q1800  . azelastine  1 spray Each Nare BID  . Chlorhexidine Gluconate Cloth  6 each Topical Q0600  . darbepoetin (ARANESP) injection - DIALYSIS  100 mcg Intravenous Q Fri-HD  . famotidine  20 mg Oral QHS  . fluticasone  2 spray Each Nare BID  . isosorbide mononitrate  60 mg Oral Daily  . loratadine  10 mg Oral Q48H  . losartan  100 mg Oral Daily  . metoprolol succinate  200 mg Oral Daily  . multivitamin  1 tablet Oral Daily  . polyethylene glycol  17 g Oral Daily  . senna-docusate  1 tablet Oral BID  . ticagrelor  90 mg Oral BID     Madelon Lips, MD [place] pgr [phone] 03/24/2018, 11:10 AM

## 2018-03-24 NOTE — Plan of Care (Signed)
  Problem: Education: Goal: Knowledge of General Education information will improve Description Including pain rating scale, medication(s)/side effects and non-pharmacologic comfort measures Outcome: Progressing   Problem: Education: Goal: Understanding of cardiac disease, CV risk reduction, and recovery process will improve Outcome: Progressing   Problem: Activity: Goal: Ability to tolerate increased activity will improve Outcome: Progressing   Problem: Education: Goal: Knowledge of disease or condition will improve Outcome: Progressing

## 2018-03-24 NOTE — Progress Notes (Signed)
Bilateral lower extremities venous duplex exam completed. Please see preliminary notes on CV PROC under chart review.  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 Keyah Blizard H Jeanifer Halliday(RDMS RVT) 03/24/18 4:33 PM

## 2018-03-24 NOTE — Progress Notes (Signed)
Physical Therapy Treatment Patient Details Name: Peter [name] MRN: [id number] DOB: 07/14/1945 Today's Date: 03/24/2018    History of Present Illness Pt is a 73 y.o. male admitted 02/27/18 with L-sided weakness while at dialysis. MRI shows small volume diffusion abnormality involving parasagittal cerebral hemispheres, additional L frontal cortical involvement compatible with acute ischemia; remote bilat cerebellar infarcts. Pt with 2x NSTEMIs since admission. S/p PCI of LAD on 2/21. 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 s/p left ICA stent placement 03/20/18. PMH includes HTN, ESRD on HD.    PT Comments    Patient more alert and awake today. Tolerated gait training with Min A for balance/safety. Of note, pt with incoordination LUE as demonstrated during finger to nose as well as dysdiadochokinesia LLE. Pt with left knee instability and ataxic like movements during ambulation. Required use of RW for support today. Sp02 remained >93% on RA throughout. Encouraged walking with nursing later today. Will continue to follow and progress as tolerated.    Follow Up Recommendations  Home health PT;Supervision - Intermittent     Equipment Recommendations  None recommended by PT    Recommendations for Other Services       Precautions / Restrictions Precautions Precautions: Fall Precaution Comments: Inattention to left side (body and environment) Restrictions Weight Bearing Restrictions: No    Mobility  Bed Mobility               General bed mobility comments: Sitting in chair upon PT arrival.   Transfers Overall transfer level: Needs assistance Equipment used: Rolling walker (2 wheeled) Transfers: Sit to/from Stand Sit to Stand: Min guard         General transfer comment: Min guard for safety. Stood from Youth worker.   Ambulation/Gait Ambulation/Gait assistance: Min assist Gait  Distance (Feet): 150 Feet Assistive device: Rolling walker (2 wheeled) Gait Pattern/deviations: Step-through pattern;Decreased stride length;Trunk flexed;Decreased stance time - left Gait velocity: decreased   General Gait Details: Slow, unsteady gait with left knee instability noted; some incoordination with LLE as well. Sp02 remained >93% on RA throughout   Stairs             Wheelchair Mobility    Modified Rankin (Stroke Patients Only) Modified Rankin (Stroke Patients Only) Pre-Morbid Rankin Score: No symptoms Modified Rankin: Moderately severe disability     Balance Overall balance assessment: Needs assistance Sitting-balance support: No upper extremity supported;Feet supported Sitting balance-Leahy Scale: Fair     Standing balance support: During functional activity Standing balance-Leahy Scale: Fair Standing balance comment: Does better with UE support for dynamic tasks               High Level Balance Comments: Pt noted to have incoordination and ataxia LUE and LLE as demonstrated during dysmetria (finger to nose) as well as dysdiadochokinesia. RN notified.             Cognition Arousal/Alertness: Awake/alert Behavior During Therapy: WFL for tasks assessed/performed Overall Cognitive Status: Impaired/Different from baseline Area of Impairment: Problem solving;Memory;Following commands                     Memory: Decreased short-term memory Following Commands: Follows multi-step commands with increased time     Problem Solving: Slow processing;Requires verbal cues General Comments: Knows that he was super tired and out of it yesterday.      Exercises      General Comments  Pertinent Vitals/Pain Pain Assessment: No/denies pain    Home Living                      Prior Function            PT Goals (current goals can now be found in the care plan section) Progress towards PT goals: Progressing toward goals     Frequency    Min 4X/week      PT Plan Current plan remains appropriate    Co-evaluation              AM-PAC PT "6 Clicks" Mobility   Outcome Measure  Help needed turning from your back to your side while in a flat bed without using bedrails?: None Help needed moving from lying on your back to sitting on the side of a flat bed without using bedrails?: None Help needed moving to and from a bed to a chair (including a wheelchair)?: A Little Help needed standing up from a chair using your arms (e.g., wheelchair or bedside chair)?: A Little Help needed to walk in hospital room?: A Little Help needed climbing 3-5 steps with a railing? : A Little 6 Click Score: 20    End of Session Equipment Utilized During Treatment: Gait belt Activity Tolerance: Patient tolerated treatment well Patient left: in chair;with call bell/phone within reach;with nursing/sitter in room Nurse Communication: Mobility status PT Visit Diagnosis: Other abnormalities of gait and mobility (R26.89);Muscle weakness (generalized) (M62.81)     Time: 1610-9604 PT Time Calculation (min) (ACUTE ONLY): 20 min  Charges:  $Gait Training: 8-22 mins                     Wray Kearns, PT, DPT Acute Rehabilitation Services Pager [phone] Office [place] 03/24/2018, 4:08 PM

## 2018-03-25 LAB — RENAL FUNCTION PANEL
Albumin: 2.7 g/dL — ABNORMAL LOW (ref 3.5–5.0)
Anion gap: 14 (ref 5–15)
BUN: 41 mg/dL — AB (ref 8–23)
CO2: 24 mmol/L (ref 22–32)
Calcium: 8.5 mg/dL — ABNORMAL LOW (ref 8.9–10.3)
Chloride: 99 mmol/L (ref 98–111)
Creatinine, Ser: 10.29 mg/dL — ABNORMAL HIGH (ref 0.61–1.24)
GFR calc Af Amer: 5 mL/min — ABNORMAL LOW (ref >60)
GFR calc non Af Amer: 5 mL/min — ABNORMAL LOW (ref >60)
Glucose, Bld: 122 mg/dL — ABNORMAL HIGH (ref 70–99)
Phosphorus: 4.6 mg/dL (ref 2.5–4.6)
Potassium: 4.1 mmol/L (ref 3.5–5.1)
SODIUM: 137 mmol/L (ref 135–145)

## 2018-03-25 LAB — CBC
HCT: 27.2 % — ABNORMAL LOW (ref 39.0–52.0)
Hemoglobin: 8.7 g/dL — ABNORMAL LOW (ref 13.0–17.0)
MCH: 29.6 pg (ref 26.0–34.0)
MCHC: 32 g/dL (ref 30.0–36.0)
MCV: 92.5 fL (ref 80.0–100.0)
PLATELETS: 168 10*3/uL (ref 150–400)
RBC: 2.94 MIL/uL — ABNORMAL LOW (ref 4.22–5.81)
RDW: 15.9 % — ABNORMAL HIGH (ref 11.5–15.5)
WBC: 10 10*3/uL (ref 4.0–10.5)
nRBC: 0 % (ref 0.0–0.2)

## 2018-03-25 MED ORDER — ALTEPLASE 2 MG IJ SOLR
2.0000 mg | INTRAMUSCULAR | Status: DC | PRN
  Administered 2018-03-25: 2 mg
  Filled 2018-03-25 (×3): qty 2

## 2018-03-25 MED ORDER — SALINE SPRAY 0.65 % NA SOLN
1.0000 | NASAL | Status: DC | PRN
  Administered 2018-03-27: 1 via NASAL
  Filled 2018-03-25: qty 44

## 2018-03-25 MED ORDER — ALTEPLASE 2 MG IJ SOLR
INTRAMUSCULAR | Status: AC
  Administered 2018-03-25: 2 mg
  Filled 2018-03-25: qty 2

## 2018-03-25 MED ORDER — DELFLEX-LC/1.5% DEXTROSE 344 MOSM/L IP SOLN: INTRAPERITONEAL | Status: AC

## 2018-03-25 MED ORDER — HEPARIN SODIUM (PORCINE) 1000 UNIT/ML IJ SOLN
INTRAMUSCULAR | Status: AC
  Administered 2018-03-25: 1000 [IU]
  Filled 2018-03-25: qty 4

## 2018-03-25 MED ORDER — CEFAZOLIN SODIUM-DEXTROSE 2-4 GM/100ML-% IV SOLN
2.0000 g | INTRAVENOUS | Status: AC
  Filled 2018-03-25 (×2): qty 100

## 2018-03-25 NOTE — Progress Notes (Signed)
PROGRESS NOTE    Peter [name]  KKX:[id number] DOB: [birth year] DOA: 02/27/2018 PCP: Patient, No Pcp Per     Brief Narrative:  Peter [name] is a 73 y.o.malewitha past medical history of ESRD on HD, hypertensionbeing brought to the hospital from his dialysis center due to strokelike symptoms with left sided weakness. Last known normal 3:15 this afternoon. Patient states after finishing dialysis today he felt weak and could not get out of the chair. Both of his legs felt weak and his right leg was shaking. He could not lift his arms. States he was told that his left arm was not moving. Denies any history of prior stroke. Work up revealed acute stroke, neurology consulted.  Also noted to have an NSTEMI on 03/04/2018 with chest pain.  Status post DES.  S/p Left ICA stent placement.  Due to increase in oxygen demand, patient underwent CTA chest which was negative for PE.  DVT ultrasound revealed severe calcification, possible occlusion of bilateral femoral artery, popliteal artery.  New events last 24 hours / Subjective: Patient seen in dialysis, he remains on room air.  He no acute complaints today.  Assessment & Plan:   Principal Problem:   Cerebrovascular accident (CVA) ([place]) Active Problems:   Elevated troponin   Elevated LFTs   HTN (hypertension)   ESRD on dialysis ([place])   CAD (coronary artery disease)   Diabetes (H[facility])   CHF (congestive heart failure) ([place])   Traumatic brain injury ([place])   Anemia of chronic disease   Right kidney mass   TIA (transient ischemic attack)   Chronic systolic CHF (congestive heart failure) (HCC)   NSTEMI (non-ST elevated myocardial infarction) ([place])   Carotid stenosis   Status post coronary artery stent placement   FTT (failure to thrive) in adult   Carotid stenosis, asymptomatic, left   Acute CVA -Embolic, unknown source -Patient presented with left-sided weakness after dialysis. He did not receive TPA due to mild deficits -Neurology consulted  and appreciated -Cerebral arteriogram showed 90% stenosis of the proximal left ICA. S/p stent assisted  angioplasty of high grade stenosis of prox Lt ICA with distal protection by IR  -Continue aspirin, brilinta, statin  Left ICA stenosis -S/p cerebral angiogram- stent assisted angioplasty of high grade stenosis of prox L ICA with distal protection 3/6 -Dr. Estanislado Pandy recommending discontinuing Plavix, and starting Brilinta and has been following P2Y12   Acute thrombocytopenia  -Platelets dropped to 46; improving  -Possibly secondary Aggrastat -Heparin has been discontinued-unlikely HIT given that patient has had heparin exposure with dialysis -DIC panel obtained: DDimer 2.22 fibrinogen 541, INR WNL no schistocytes on smear -Resolved   Hypoxia -DDimer was elevated 2.22 -Patient has been placed on 3L of O2 for supposed O2 saturation of 86% this morning (however, not documented) -CTA chest negative for PE -On room air on examination today   NSTEMI -Patient did have chest pain the night of 03/03/2018 03/04/2018, with a peak troponin of 6.4 -Cardiology consulted and appreciated -Status post cardiac catheterization on 03/06/2018 with PCI/DES x1 to the LAD with scoring balloon to ISR in the ostium of OM1.  Continue to treat RCA disease medically. -Patient will need DAPT with aspirin and Plavix for at least 1 year once done with Brilinta as per interventional radiology recommendation -Cardiology recommended 30-day event monitor with TEE/possible ICD evaluation on outpatient basis through his primary cardiologist, Dr. Beatrix Fetters -Of note, TEE was attempted but aborted on 03/03/2018, as patient was unable to be sedated adequately -No further complaints of  chest pain  Chronic systolic congestive heart failure -Echocardiogram on 02/28/2018 showed an EF of 30 to 35%, cavity size mildly dilated.  Diffuse hypokinesis.  Impaired relaxation of left ventricle -Continue volume management with  dialysis -Follow-up with cardiology as an outpatient  ESRD -Patient dialyzes Monday Wednesday Friday -Nephrology consulted and appreciated  Anemia of chronic disease -FOBT was positive however does not appear to be actively bleeding. -Previous hospitalist discussed with gastroenterologist, Dr. Silverio Decamp on 03/13/2018, given his recent MI and stent placement, heme positive, would recommend outpatient follow-up for colonoscopy -Received 2u PRBC in the past few days -Hemoglobin currently stable  Right renal mass -Incidental finding from abdominal ultrasound on 02/28/2018: Small mass in the lower pole of the right kidney, could reflect complex cyst or solid lesion.  Noncontrast MRI best available imaging option. -Will need to follow-up as an outpatient  Essential hypertension -Stable, continue amlodipine, losartan, metoprolol  HLD -Continue lipitor   Failure to thrive/physical deconditioning  -Likely due to prolonged hospitalization -Patient has progressed with physical therapy, now recommending home health   DVT prophylaxis: SCD Code Status: Full Family Communication: No family at bedside Disposition Plan: Home health   Consultants:   Neurology  IR  Nephrology  GI  Cardiology   Antimicrobials:  Anti-infectives (From admission, onward)   Start     Dose/Rate Route Frequency Ordered Stop   03/25/18 1530  ceFAZolin (ANCEF) IVPB 2g/100 mL premix     2 g 200 mL/hr over 30 Minutes Intravenous  Once 03/25/18 1516     03/20/18 0000  ceFAZolin (ANCEF) IVPB 2g/100 mL premix     2 g 200 mL/hr over 30 Minutes Intravenous 60 min pre-op 03/18/18 0112 03/20/18 1155   03/17/18 0600  ceFAZolin (ANCEF) IVPB 2g/100 mL premix  Status:  Discontinued     2 g 200 mL/hr over 30 Minutes Intravenous To Short Stay 03/16/18 0729 03/17/18 0950        Objective: Vitals:   03/25/18 1100 03/25/18 1240 03/25/18 1247 03/25/18 1441  BP: 127/63 139/67 (!) 160/59 (!) 135/45  Pulse: 64 67  67   Resp:   16 18  Temp:   98.2 F (36.8 C) 98.3 F (36.8 C)  TempSrc:   Oral Oral  SpO2:   98% 98%  Weight:   65.5 kg   Height:        Intake/Output Summary (Last 24 hours) at 03/25/2018 1517 Last data filed at 03/25/2018 1247 Gross per 24 hour  Intake 0 ml  Output 2033 ml  Net -2033 ml   Filed Weights   03/23/18 1039 03/25/18 0800 03/25/18 1247  Weight: 66 kg 67.1 kg 65.5 kg    Examination:  General exam: Appears calm and comfortable  Respiratory system: Clear to auscultation. Respiratory effort normal. Cardiovascular system: S1 & S2 heard, RRR. No JVD, murmurs, rubs, gallops or clicks. No pedal edema. Gastrointestinal system: Abdomen is nondistended, soft and nontender. No organomegaly or masses felt. Normal bowel sounds heard. Central nervous system: Alert and oriented. No focal neurological deficits. Extremities: Symmetric 5 x 5 power. Skin: No rashes, lesions or ulcers Psychiatry: Judgement and insight appear normal. Mood & affect appropriate.   Data Reviewed: I have personally reviewed following labs and imaging studies  CBC: Recent Labs  Lab 03/20/18 0611 03/21/18 0423 03/21/18 0815 03/22/18 1217 03/23/18 0532 03/24/18 0406 03/25/18 0430  WBC 11.0* 9.5 8.1 9.2 10.8* 8.9 10.0  NEUTROABS 5.6 6.1  --   --   --   --   --  HGB 10.5* 7.8* 8.2* 8.9* 8.9* 9.1* 8.7*  HCT 31.7* 22.8* 23.7* 26.3* 27.4* 28.2* 27.2*  MCV 91.6 91.9 92.6 89.8 90.7 91.3 92.5  PLT 278 46* 49* 68*   67* 81* 105* 371   Basic Metabolic Panel: Recent Labs  Lab 03/19/18 0518  03/21/18 0423 03/22/18 1217 03/23/18 0532 03/24/18 0406 03/25/18 0430  NA 134*   < > 132* 134* 134* 136 137  K 3.9   < > 4.9 3.9 4.3 3.7 4.1  CL 96*   < > 97* 96* 98 97* 99  CO2 25   < > 17* 24 20* 26 24  GLUCOSE 130*   < > 135* 166* 130* 130* 122*  BUN 27*   < > 52* 33* 42* 26* 41*  CREATININE 8.03*   < > 11.86* 8.85* 10.70* 7.95* 10.29*  CALCIUM 8.2*   < > 8.0* 8.3* 8.3* 8.3* 8.5*  PHOS 3.8  --  5.2*  --    --  4.5 4.6   < > = values in this interval not displayed.   GFR: Estimated Creatinine Clearance: 6.2 mL/min (A) (by C-G formula based on SCr of 10.29 mg/dL (H)). Liver Function Tests: Recent Labs  Lab 03/19/18 0518 03/21/18 0423 03/24/18 0406 03/25/18 0430  ALBUMIN 2.7* 2.7* 2.6* 2.7*   No results for input(s): LIPASE, AMYLASE in the last 168 hours. No results for input(s): AMMONIA in the last 168 hours. Coagulation Profile: Recent Labs  Lab 03/22/18 1217  INR 1.2   Cardiac Enzymes: No results for input(s): CKTOTAL, CKMB, CKMBINDEX, TROPONINI in the last 168 hours. BNP (last 3 results) No results for input(s): PROBNP in the last 8760 hours. HbA1C: No results for input(s): HGBA1C in the last 72 hours. CBG: Recent Labs  Lab 03/23/18 1051  GLUCAP 100*   Lipid Profile: No results for input(s): CHOL, HDL, LDLCALC, TRIG, CHOLHDL, LDLDIRECT in the last 72 hours. Thyroid Function Tests: No results for input(s): TSH, T4TOTAL, FREET4, T3FREE, THYROIDAB in the last 72 hours. Anemia Panel: No results for input(s): VITAMINB12, FOLATE, FERRITIN, TIBC, IRON, RETICCTPCT in the last 72 hours. Sepsis Labs: No results for input(s): PROCALCITON, LATICACIDVEN in the last 168 hours.  No results found for this or any previous visit (from the past 240 hour(s)).     Radiology Studies: Ct Angio Chest Pe W Or Wo Contrast  Result Date: 03/24/2018 CLINICAL DATA:  Pt admitted in February as a Code stroke. Pt still hasn't been discharged; Pt c/o SOB, with elevated D-Dimer; Hx of ESRD, denies prior PE, DVT EXAM: CT ANGIOGRAPHY CHEST WITH CONTRAST TECHNIQUE: Multidetector CT imaging of the chest was performed using the standard protocol during bolus administration of intravenous contrast. Multiplanar CT image reconstructions and MIPs were obtained to evaluate the vascular anatomy. CONTRAST:  32mL OMNIPAQUE IOHEXOL 350 MG/ML SOLN COMPARISON:  11/30/2017 FINDINGS: Cardiovascular: Four-chamber  cardiac enlargement. No pericardial effusion. Dilated central pulmonary arteries. Satisfactory opacification of pulmonary arteries noted, and there is no evidence of pulmonary emboli. Extensive coronary calcifications. Fair contrast opacification of thoracic aorta. No aneurysm. No suggestion of dissection or stenosis although evaluation is limited. Moderate calcified atheromatous plaque throughout. The visualized proximal abdominal aorta is atheromatous, otherwise unremarkable. Mediastinum/Nodes: No hilar or mediastinal adenopathy. Lungs/Pleura: Trace bilateral pleural effusions. The loculated fluid previously seen in the left major fissure has nearly completely resolved. No pneumothorax. Emphysema with multiple subpleural blebs. Linear scarring or subsegmental atelectasis and mild adjacent ground-glass opacities in the dependent aspect of both lower lobes. Upper Abdomen:  Parenchymal calcifications in the visualized portions of both kidneys, incompletely characterized. No suggestion of hydronephrosis. No acute findings. Musculoskeletal: Healing sternal fracture, nondisplaced. Healing right fourth and fifth rib fractures, and left fourth rib fracture. Review of the MIP images confirms the above findings. IMPRESSION: 1. Negative for acute PE. 2. Four-chamber cardiac enlargement with dilated central pulmonary arteries suggesting pulmonary arterial hypertension. 3. Coronary and Aortic Atherosclerosis (ICD10-I70.0) and Emphysema (ICD10-J43.9). 4. Trace bilateral pleural effusions. 5. Healing sternal and bilateral rib fractures. Electronically Signed   By: Lucrezia Europe M.D.   On: 03/24/2018 12:24   Vas [place] Lower Extremity Venous (dvt)  Result Date: 03/24/2018  Lower Venous Study Indications: SOB, and elevated D-dimer.  Limitations: Body habitus and shadowing from calcification of adjacent arteries. Comparison Study: No comparison study available Performing Technologist: Rudell Cobb  Examination Guidelines: A complete  evaluation includes B-mode imaging, spectral Doppler, color Doppler, and power Doppler as needed of all accessible portions of each vessel. Bilateral testing is considered an integral part of a complete examination. Limited examinations for reoccurring indications may be performed as noted.  Right Venous Findings: +---------+---------------+---------+-----------+----------+--------------+            Compressibility Phasicity Spontaneity Properties Summary         +---------+---------------+---------+-----------+----------+--------------+  CFV       Full            Yes       Yes                                    +---------+---------------+---------+-----------+----------+--------------+  SFJ       Full                                                             +---------+---------------+---------+-----------+----------+--------------+  FV Prox   Full                                                             +---------+---------------+---------+-----------+----------+--------------+  FV Mid    Full                                                             +---------+---------------+---------+-----------+----------+--------------+  FV Distal Full                                                             +---------+---------------+---------+-----------+----------+--------------+  PFV  Not visualized  +---------+---------------+---------+-----------+----------+--------------+  POP       Full            Yes       Yes                                    +---------+---------------+---------+-----------+----------+--------------+  PTV       Full                                                             +---------+---------------+---------+-----------+----------+--------------+  PERO      Full                                                             +---------+---------------+---------+-----------+----------+--------------+ Incidental finding: Severe  calcification and possible occlusion of FA,POP A.  Left Venous Findings: +---------+---------------+---------+-----------+----------+--------------+            Compressibility Phasicity Spontaneity Properties Summary         +---------+---------------+---------+-----------+----------+--------------+  CFV       Full            Yes       Yes                                    +---------+---------------+---------+-----------+----------+--------------+  SFJ       Full                                                             +---------+---------------+---------+-----------+----------+--------------+  FV Prox   Full                                                             +---------+---------------+---------+-----------+----------+--------------+  FV Mid    Full                                                             +---------+---------------+---------+-----------+----------+--------------+  FV Distal Full                                                             +---------+---------------+---------+-----------+----------+--------------+  PFV  Not visualized  +---------+---------------+---------+-----------+----------+--------------+  POP       Full            Yes       Yes                                    +---------+---------------+---------+-----------+----------+--------------+  PTV       Full                                                             +---------+---------------+---------+-----------+----------+--------------+  PERO      Full                                                             +---------+---------------+---------+-----------+----------+--------------+ Incidental finding: Severe calcification and possible occlusion of FA,POP A.    Summary: Right: There is no evidence of deep vein thrombosis in the lower extremity. However, portions of this examination were limited- see technologist comments above. No cystic structure found in  the popliteal fossa. Left: There is no evidence of deep vein thrombosis in the lower extremity. However, portions of this examination were limited- see technologist comments above. No cystic structure found in the popliteal fossa. Incidental findings: Severe calcification and possible occlusion of FA,POP A bilaterally.  *See table(s) above for measurements and observations. Electronically signed by Deitra Mayo MD on 03/24/2018 at 8:34:34 PM.    Final       Scheduled Meds:  amLODipine  10 mg Oral QHS   aspirin  81 mg Oral Daily   Or   aspirin  81 mg Per Tube Daily   atorvastatin  40 mg Oral q1800   azelastine  1 spray Each Nare BID   Chlorhexidine Gluconate Cloth  6 each Topical Q0600   darbepoetin (ARANESP) injection - DIALYSIS  100 mcg Intravenous Q Fri-HD   famotidine  20 mg Oral QHS   fluticasone  2 spray Each Nare BID   isosorbide mononitrate  60 mg Oral Daily   loratadine  10 mg Oral Q48H   losartan  100 mg Oral Daily   metoprolol succinate  200 mg Oral Daily   multivitamin  1 tablet Oral Daily   polyethylene glycol  17 g Oral Daily   senna-docusate  1 tablet Oral BID   ticagrelor  90 mg Oral BID   Continuous Infusions:  sodium chloride 10 mL/hr at 03/20/18 1101    ceFAZolin (ANCEF) IV       LOS: 25 days    Time spent: 40 minutes   Dessa Phi, DO [facility] [email or web address] 03/25/2018, 3:17 PM

## 2018-03-25 NOTE — Procedures (Signed)
Patient seen and examined on Hemodialysis. BP (!) 162/65   Pulse 67   Temp 98.5 F (36.9 C) (Oral)   Resp 16   Ht 6\' 3"  (1.905 m)   Wt 67.1 kg   SpO2 98%   BMI 18.49 kg/m   QB 400 mL/ min via TDC, UF goal 4L  Awake, alert, feeling better.  Discussed Peter [name]'s care with dtr Malika at bedside over the phone.  Tolerating treatment without complaints at this time.   Madelon Lips MD  [facility] pgr [phone] 8:48 AM

## 2018-03-25 NOTE — Plan of Care (Signed)
  Problem: Education: Goal: Knowledge of General Education information will improve Description Including pain rating scale, medication(s)/side effects and non-pharmacologic comfort measures Outcome: Progressing   Problem: Education: Goal: Understanding of cardiac disease, CV risk reduction, and recovery process will improve Outcome: Progressing   Problem: Activity: Goal: Ability to tolerate increased activity will improve Outcome: Progressing   Problem: Education: Goal: Knowledge of disease or condition will improve Outcome: Progressing Goal: Knowledge of patient specific risk factors addressed and post discharge goals established will improve Outcome: Progressing   Problem: Education: Goal: Knowledge of patient specific risk factors addressed and post discharge goals established will improve Outcome: Progressing   Problem: Health Behavior/Discharge Planning: Goal: Ability to manage health-related needs will improve Outcome: Progressing

## 2018-03-25 NOTE — Progress Notes (Signed)
Physical Therapy Treatment Patient Details Name: Peter [name] MRN: [id number] DOB: 1945/11/26 Today's Date: 03/25/2018    History of Present Illness Pt is a 73 y.o. male admitted 02/27/18 with L-sided weakness while at dialysis. MRI shows small volume diffusion abnormality involving parasagittal cerebral hemispheres, additional L frontal cortical involvement compatible with acute ischemia; remote bilat cerebellar infarcts. Pt with 2x NSTEMIs since admission. S/p PCI of [name] on 2/21. 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 s/p left ICA stent placement 03/20/18. PMH includes HTN, ESRD on HD.    PT Comments    Pt fatigued from HD, but agreeable to participating.  Pt too tired for exercise, but showed improvement with gait stability.  Left LE more controlled with good heel/toe pattern.    Follow Up Recommendations  Home health PT;Supervision - Intermittent     Equipment Recommendations  None recommended by PT    Recommendations for Other Services       Precautions / Restrictions Precautions Precautions: Fall    Mobility  Bed Mobility               General bed mobility comments: Sitting in chair upon PT arrival.   Transfers Overall transfer level: Needs assistance Equipment used: Rolling walker (2 wheeled) Transfers: Sit to/from Stand Sit to Stand: Min guard            Ambulation/Gait Ambulation/Gait assistance: Min assist Gait Distance (Feet): 200 Feet Assistive device: Rolling walker (2 wheeled) Gait Pattern/deviations: Step-through pattern;Decreased stride length;Trunk flexed;Decreased stance time - left     General Gait Details: more steady, left leg with much more control.  Pt quick to fatigue   Stairs             Wheelchair Mobility    Modified Rankin (Stroke Patients Only) Modified Rankin (Stroke Patients Only) Modified Rankin: Moderately severe  disability     Balance Overall balance assessment: Needs assistance   Sitting balance-Leahy Scale: Fair       Standing balance-Leahy Scale: Fair                              Cognition Arousal/Alertness: Awake/alert Behavior During Therapy: WFL for tasks assessed/performed Overall Cognitive Status: (NT formally, pt interacted functionally)                                        Exercises      General Comments General comments (skin integrity, edema, etc.): pt's SpO2 at 100% on RA immediately after gait.  pt declined exercise      Pertinent Vitals/Pain Faces Pain Scale: No hurt    Home Living                      Prior Function            PT Goals (current goals can now be found in the care plan section) Acute Rehab PT Goals Patient Stated Goal: Return to normal PT Goal Formulation: With patient Time For Goal Achievement: 04/02/18 Potential to Achieve Goals: Good Progress towards PT goals: Progressing toward goals    Frequency    Min 4X/week      PT Plan Current plan remains appropriate    Co-evaluation  AM-PAC PT "6 Clicks" Mobility   Outcome Measure  Help needed turning from your back to your side while in a flat bed without using bedrails?: None Help needed moving from lying on your back to sitting on the side of a flat bed without using bedrails?: None Help needed moving to and from a bed to a chair (including a wheelchair)?: A Little Help needed standing up from a chair using your arms (e.g., wheelchair or bedside chair)?: A Little Help needed to walk in hospital room?: A Little Help needed climbing 3-5 steps with a railing? : A Little 6 Click Score: 20    End of Session   Activity Tolerance: Patient tolerated treatment well Patient left: in chair;with call bell/phone within reach;with nursing/sitter in room Nurse Communication: Mobility status PT Visit Diagnosis: Other abnormalities of  gait and mobility (R26.89);Muscle weakness (generalized) (M62.81)     Time: 9528-4132 PT Time Calculation (min) (ACUTE ONLY): 15 min  Charges:  $Gait Training: 8-22 mins                     03/25/2018  Donnella Sham, [place] [phone]  (pager) [phone]  (office)   Tessie Fass [place] 03/25/2018, 5:56 PM

## 2018-03-25 NOTE — Progress Notes (Signed)
Difficulty with HD catheter during dialysis today. Catheter was originally placed by Dr. Kathlene Cote 12/08/17.  IR consulted for exchange.  Consent in chart.  Ancef ordered.   Brynda Greathouse, MS RD PA-C 2:36 PM

## 2018-03-25 NOTE — Progress Notes (Addendum)
[facility] Progress Note   Assessment/ Plan:   [facility]  MWF 3.5h R IJ TDC 149lbs (67.5kg)  Hep 2700 then 500u/hr - hect 1.5 ug IV tiw - ferrlicit - no esa   Assessment/Plan: 1. 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 Brilinta/ asa.  2. Thrombocytopenia, acute - 278 --> 46 (3/7), now up to 81--> 105--> 168.  No schistocytes on smear, INR 1.2, fibrinogen WNL, not likely DIC or TTP.  Likely effect of aggrastat, per primary.  Had not been receiving heparin in HD d/t thrombocytopenia; now that they are normalized can add back.  3. NSTEMI- s/p PCI of LAD on 03/06/18. Per primary.  4. ESRDon HD qMWF. HD on schedule no hep with low plts, finally normalized today 3/11 at 168 so could add back when needed.  Catheter malfunction today, not resolved with alteplase, will ask IR to exchange.  Per pt and dtr, had been on PD until this fall until he had a respiratory arrest in the setting of performing manual drains instead of cycler prescription.  He still has the PD catheter in place and it has not been flushed.  He has a [facility] for now in hopes that one day he will be able to return to PD.  We will flush his PD catheter q weekly and perform exit site care.   5. HTN/volume: on losartan 100/ metop xl 200 qd/ amlodipine 5 > 10/qd. BP's still up, trying for good UF goal again today 6. Anemia of ckd/ blood draws/ guiac+: Hb 13 >> 7.9 here, was not getting esa at OP unit; got darbe 60 ug here 2/23 and 2/28, then 100ug 3/7.Tsat 22% 2/24 - getting 500mg  load of Fe here.  Got 1u prbc 3/5 and another 1 u pRBCs 3/7.  Due for ESA 3/14.   7. Anemia/ Guiac+ - not active bleed, seen by GI 2/28, no intervention now given recent MI/ stent 8. CKD-MBD:stable 9. Nutrition:renal diet 10. Hypoxia/AMS- appears improved after HD.  D/c'd benadryl in case contributory to AMS.  D/w primary, given elevated Ddimer and repeated hypoxia will r/o PE- no  evidence 11. PAD-incidentally noted was severe calcification and possible occlusion of femoral and popliteal arteries bilaterally.  Could consider VVS c/s 12. Disposition: pending  Subjective:    Seen on HD.  Able to stand to get weights.  Doing better.  CTA and Dopplers negative for DVT/PE.  Incidentally noted was severe calcification and possible occlusion of FA,POP artery bilaterally.   Objective:   BP (!) 163/70 (BP Location: Right Arm)   Pulse 67   Temp 98.1 F (36.7 C) (Oral)   Resp 18   Ht 6\' 3"  (1.905 m)   Wt 66 kg Comment: standing weight  SpO2 100%   BMI 18.19 kg/m   Physical Exam: Gen: chronically ill-appearing, NAD, lying in bed CVS: RRR with loud S2 Resp: clear bilaterally to the bases Abd: soft, PD cath in the RUQ, dressing in place Ext: no LE edema ACCESS: R IJ [facility]  Labs: BMET Recent Labs  Lab 03/19/18 0518 03/20/18 0611 03/21/18 0423 03/22/18 1217 03/23/18 0532 03/24/18 0406 03/25/18 0430  NA 134* 133* 132* 134* 134* 136 137  K 3.9 4.5 4.9 3.9 4.3 3.7 4.1  CL 96* 95* 97* 96* 98 97* 99  CO2 25 22 17* 24 20* 26 24  GLUCOSE 130* 111* 135* 166* 130* 130* 122*  BUN 27* 42* 52* 33* 42* 26*  41*  CREATININE 8.03* 10.38* 11.86* 8.85* 10.70* 7.95* 10.29*  CALCIUM 8.2* 9.1 8.0* 8.3* 8.3* 8.3* 8.5*  PHOS 3.8  --  5.2*  --   --  4.5 4.6   CBC Recent Labs  Lab 03/20/18 0611 03/21/18 0423  03/22/18 1217 03/23/18 0532 03/24/18 0406 03/25/18 0430  WBC 11.0* 9.5   < > 9.2 10.8* 8.9 10.0  NEUTROABS 5.6 6.1  --   --   --   --   --   HGB 10.5* 7.8*   < > 8.9* 8.9* 9.1* 8.7*  HCT 31.7* 22.8*   < > 26.3* 27.4* 28.2* 27.2*  MCV 91.6 91.9   < > 89.8 90.7 91.3 92.5  PLT 278 46*   < > 68*  67* 81* 105* 168   < > = values in this interval not displayed.    @IMGRELPRIORS @ Medications:    . amLODipine  10 mg Oral QHS  . aspirin  81 mg Oral Daily   Or  . aspirin  81 mg Per Tube Daily  . atorvastatin  40 mg Oral q1800  . azelastine  1 spray Each Nare BID   . Chlorhexidine Gluconate Cloth  6 each Topical Q0600  . darbepoetin (ARANESP) injection - DIALYSIS  100 mcg Intravenous Q Fri-HD  . famotidine  20 mg Oral QHS  . fluticasone  2 spray Each Nare BID  . isosorbide mononitrate  60 mg Oral Daily  . loratadine  10 mg Oral Q48H  . losartan  100 mg Oral Daily  . metoprolol succinate  200 mg Oral Daily  . multivitamin  1 tablet Oral Daily  . polyethylene glycol  17 g Oral Daily  . senna-docusate  1 tablet Oral BID  . ticagrelor  90 mg Oral BID     Madelon Lips, MD [place] [facility] [phone] 03/25/2018, 8:15 AM

## 2018-03-26 LAB — BASIC METABOLIC PANEL
ANION GAP: 11 (ref 5–15)
BUN: 37 mg/dL — ABNORMAL HIGH (ref 8–23)
CALCIUM: 8.2 mg/dL — AB (ref 8.9–10.3)
CO2: 26 mmol/L (ref 22–32)
Chloride: 100 mmol/L (ref 98–111)
Creatinine, Ser: 9.26 mg/dL — ABNORMAL HIGH (ref 0.61–1.24)
GFR calc Af Amer: 6 mL/min — ABNORMAL LOW (ref >60)
GFR calc non Af Amer: 5 mL/min — ABNORMAL LOW (ref >60)
GLUCOSE: 142 mg/dL — AB (ref 70–99)
Potassium: 4.1 mmol/L (ref 3.5–5.1)
Sodium: 137 mmol/L (ref 135–145)

## 2018-03-26 LAB — CBC
HCT: 27 % — ABNORMAL LOW (ref 39.0–52.0)
Hemoglobin: 8.8 g/dL — ABNORMAL LOW (ref 13.0–17.0)
MCH: 30.3 pg (ref 26.0–34.0)
MCHC: 32.6 g/dL (ref 30.0–36.0)
MCV: 93.1 fL (ref 80.0–100.0)
Platelets: 231 10*3/uL (ref 150–400)
RBC: 2.9 MIL/uL — ABNORMAL LOW (ref 4.22–5.81)
RDW: 16.2 % — AB (ref 11.5–15.5)
WBC: 10.4 10*3/uL (ref 4.0–10.5)
nRBC: 0 % (ref 0.0–0.2)

## 2018-03-26 NOTE — Progress Notes (Signed)
[facility] Progress Note   Assessment/ Plan:   [facility]  MWF 3.5h R IJ TDC 149lbs (67.5kg)  Hep 2700 then 500u/hr - hect 1.5 ug IV tiw - ferrlicit - no esa   Assessment/Plan: 1. 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 Brilinta/ asa.  2. Thrombocytopenia, acute - 278 --> 46 (3/7), now up to 81--> 105--> 168; resolved.  No schistocytes on smear, INR 1.2, fibrinogen WNL, not likely DIC or TTP.  Likely effect of aggrastat, per primary.  Had not been receiving heparin in HD d/t thrombocytopenia; now that they are normalized can add back.   3. NSTEMI- s/p PCI of LAD on 03/06/18. Per primary.  4. ESRDon HD qMWF. HD on schedule no hep with low plts, finally normalized today 3/11 at 168 so could add back when needed.  For HD cath exchange 3/12. Per pt and dtr, had been on PD until this fall until he had a respiratory arrest in the setting of performing manual drains instead of cycler prescription.  He has a [facility] for now in hopes that one day he will be able to return to PD.  He still has the PD catheter in place and we will flush his PD catheter q weekly and perform exit site care.   5. HTN/volume: on losartan 100/ metop xl 200 qd/ amlodipine 5 > 10/qd. BP's stable 6. Anemia of ckd/ blood draws/ guiac+: Hb stable 8.8 today.  Got 1u prbc 3/5 and another 1 u pRBCs 3/7.  Aranesp qFri 7. Anemia/ Guiac+ - not active bleed, seen by GI 2/28, no intervention now given recent MI/ stent 8. CKD-MBD:stable 9. Nutrition:renal diet 10. Hypoxia/AMS- appears improved after HD.  D/c'd benadryl in case contributory to AMS.  D/w primary, given elevated Ddimer and repeated hypoxia will r/o PE- no evidence 11. PAD-incidentally noted was severe calcification and possible occlusion of femoral and popliteal arteries bilaterally.  Could consider VVS c/s 12. Disposition: pending  Subjective:    HD yesterday 2L UF No c/o this AM For [facility],  non-functional yesterday tPA ineffective PLT 231    Objective:   BP (!) 147/60 (BP Location: Right Arm)   Pulse 72   Temp 98.1 F (36.7 C) (Oral)   Resp 18   Ht 6\' 3"  (1.905 m)   Wt 65.5 kg   SpO2 97%   BMI 18.05 kg/m   Physical Exam: Gen: NAD CVS: RRR with loud S2 Resp: clear bilaterally to the bases Abd: soft, PD cath in the RUQ, dressing in place Ext: no LE edema ACCESS: R IJ [facility]  Labs: BMET Recent Labs  Lab 03/20/18 0611 03/21/18 0423 03/22/18 1217 03/23/18 0532 03/24/18 0406 03/25/18 0430 03/26/18 0402  NA 133* 132* 134* 134* 136 137 137  K 4.5 4.9 3.9 4.3 3.7 4.1 4.1  CL 95* 97* 96* 98 97* 99 100  CO2 22 17* 24 20* 26 24 26   GLUCOSE 111* 135* 166* 130* 130* 122* 142*  BUN 42* 52* 33* 42* 26* 41* 37*  CREATININE 10.38* 11.86* 8.85* 10.70* 7.95* 10.29* 9.26*  CALCIUM 9.1 8.0* 8.3* 8.3* 8.3* 8.5* 8.2*  PHOS  --  5.2*  --   --  4.5 4.6  --    CBC Recent Labs  Lab 03/20/18 0611 03/21/18 0423  03/23/18 0532 03/24/18 0406 03/25/18 0430 03/26/18 0402  WBC 11.0* 9.5   < > 10.8* 8.9 10.0 10.4  NEUTROABS 5.6 6.1  --   --   --   --   --  HGB 10.5* 7.8*   < > 8.9* 9.1* 8.7* 8.8*  HCT 31.7* 22.8*   < > 27.4* 28.2* 27.2* 27.0*  MCV 91.6 91.9   < > 90.7 91.3 92.5 93.1  PLT 278 46*   < > 81* 105* 168 231   < > = values in this interval not displayed.    @IMGRELPRIORS @ Medications:    . amLODipine  10 mg Oral QHS  . aspirin  81 mg Oral Daily   Or  . aspirin  81 mg Per Tube Daily  . atorvastatin  40 mg Oral q1800  . azelastine  1 spray Each Nare BID  . Chlorhexidine Gluconate Cloth  6 each Topical Q0600  . darbepoetin (ARANESP) injection - DIALYSIS  100 mcg Intravenous Q Fri-HD  . famotidine  20 mg Oral QHS  . fluticasone  2 spray Each Nare BID  . isosorbide mononitrate  60 mg Oral Daily  . loratadine  10 mg Oral Q48H  . losartan  100 mg Oral Daily  . metoprolol succinate  200 mg Oral Daily  . multivitamin  1 tablet Oral Daily  . polyethylene  glycol  17 g Oral Daily  . senna-docusate  1 tablet Oral BID  . ticagrelor  90 mg Oral BID     [facility] 03/26/2018, 10:35 AM

## 2018-03-26 NOTE — Progress Notes (Addendum)
  IR unable to accommodate patient's procedure today due to scheduling.  Will tentatively plan for tomorrow.  Attempted to call nurse to inform her but no answer.  Judie Grieve Grayton Lobo PA-C 03/26/2018 3:42 PM

## 2018-03-26 NOTE — Progress Notes (Signed)
Physical Therapy Treatment Patient Details Name: Peter [name] MRN: [id number] DOB: 21-Feb-1945 Today's Date: 03/26/2018    History of Present Illness Pt is a 73 y.o. male admitted 02/27/18 with L-sided weakness while at dialysis. MRI shows small volume diffusion abnormality involving parasagittal cerebral hemispheres, additional L frontal cortical involvement compatible with acute ischemia; remote bilat cerebellar infarcts. Pt with 2x NSTEMIs since admission. S/p PCI of LAD on 2/21. 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 s/p left ICA stent placement 03/20/18. PMH includes HTN, ESRD on HD.    PT Comments    Participative and eager to do activity as usual.  Emphasis on there ex in standing then progressing gait stability, quality and endurance.   Follow Up Recommendations  Other (comment)(transition to OPPT for higher level balance)     Equipment Recommendations  None recommended by PT    Recommendations for Other Services       Precautions / Restrictions Precautions Precautions: Fall    Mobility  Bed Mobility               General bed mobility comments: Sitting in chair upon OT arrival.   Transfers Overall transfer level: Needs assistance Equipment used: None Transfers: Sit to/from Stand Sit to Stand: Min guard            Ambulation/Gait Ambulation/Gait assistance: Min assist Gait Distance (Feet): 15 Feet(then additional 150 after exercise.) Assistive device: Rolling walker (2 wheeled) Gait Pattern/deviations: Step-through pattern;Decreased stride length;Trunk flexed;Decreased stance time - left Gait velocity: decreased Gait velocity interpretation: 1.31 - 2.62 ft/sec, indicative of limited community ambulator General Gait Details: still fairly good heel toe pattern with left knee eventually showing less control as pt's gait fatigued.   Stairs              Wheelchair Mobility    Modified Rankin (Stroke Patients Only) Modified Rankin (Stroke Patients Only) Pre-Morbid Rankin Score: No symptoms Modified Rankin: Moderately severe disability     Balance Overall balance assessment: Needs assistance Sitting-balance support: No upper extremity supported;Feet supported Sitting balance-Leahy Scale: Good       Standing balance-Leahy Scale: Fair                              Cognition Arousal/Alertness: Awake/alert Behavior During Therapy: WFL for tasks assessed/performed Overall Cognitive Status: Within Functional Limits for tasks assessed                                        Exercises General Exercises - Lower Extremity Hip ABduction/ADduction: AROM;Both;10 reps;Standing Hip Flexion/Marching: AROM;Both;10 reps;Standing Toe Raises: AROM;10 reps;Standing Heel Raises: AROM;Both;10 reps;Standing Mini-Sqauts: AROM;10 reps;Standing Other Exercises Other Exercises: tricep presses x10 reps via modified standing push ups against sink base.    General Comments        Pertinent Vitals/Pain Pain Assessment: Faces Faces Pain Scale: Hurts a little bit Pain Location: L abdominal pain    Home Living                      Prior Function            PT Goals (current goals can now be found in the care plan section) Acute Rehab PT Goals PT Goal Formulation: With patient Time For Goal Achievement: 04/02/18 Potential  to Achieve Goals: Good Progress towards PT goals: Progressing toward goals    Frequency    Min 4X/week      PT Plan Current plan remains appropriate    Co-evaluation              AM-PAC PT "6 Clicks" Mobility   Outcome Measure  Help needed turning from your back to your side while in a flat bed without using bedrails?: None Help needed moving from lying on your back to sitting on the side of a flat bed without using bedrails?: None Help needed moving to and from a  bed to a chair (including a wheelchair)?: A Little Help needed standing up from a chair using your arms (e.g., wheelchair or bedside chair)?: A Little Help needed to walk in hospital room?: A Little Help needed climbing 3-5 steps with a railing? : A Little 6 Click Score: 20    End of Session   Activity Tolerance: Patient tolerated treatment well Patient left: in chair;with call bell/phone within reach;with nursing/sitter in room Nurse Communication: Mobility status PT Visit Diagnosis: Other abnormalities of gait and mobility (R26.89);Muscle weakness (generalized) (M62.81)     Time: 9024-0973 PT Time Calculation (min) (ACUTE ONLY): 20 min  Charges:  $Gait Training: 8-22 mins                     03/26/2018  Donnella Sham, [place] [phone]  (pager) [phone]  (office)   Tessie Fass Dawanna Grauberger 03/26/2018, 5:34 PM

## 2018-03-26 NOTE — Progress Notes (Signed)
PROGRESS NOTE    Peter [name]  GDJ:[id number] DOB: 01-07-1946 DOA: 02/27/2018 PCP: Patient, No Pcp Per     Brief Narrative:  Peter [name] is a 73 y.o.malewitha past medical history of ESRD on HD, hypertensionbeing brought to the hospital from his dialysis center due to strokelike symptoms with left sided weakness. Last known normal 3:15 this afternoon. Patient states after finishing dialysis today he felt weak and could not get out of the chair. Both of his legs felt weak and his right leg was shaking. He could not lift his arms. States he was told that his left arm was not moving. Denies any history of prior stroke. Work up revealed acute stroke, neurology consulted.  Also noted to have an NSTEMI on 03/04/2018 with chest pain.  Status post DES.  S/p Left ICA stent placement.  Due to increase in oxygen demand, patient underwent CTA chest which was negative for PE.  DVT ultrasound revealed severe calcification, possible occlusion of bilateral femoral artery, popliteal artery.  New events last 24 hours / Subjective: Doing well this morning, complains of being hungry as he is n.p.o. pending IR procedure.  No new complaints or issues today.  Assessment & Plan:   Principal Problem:   Cerebrovascular accident (CVA) ([place]) Active Problems:   Elevated troponin   Elevated LFTs   HTN (hypertension)   ESRD on dialysis ([place])   CAD (coronary artery disease)   Diabetes (H[facility])   CHF (congestive heart failure) ([place])   Traumatic brain injury ([place])   Anemia of chronic disease   Right kidney mass   TIA (transient ischemic attack)   Chronic systolic CHF (congestive heart failure) (HCC)   NSTEMI (non-ST elevated myocardial infarction) ([place])   Carotid stenosis   Status post coronary artery stent placement   FTT (failure to thrive) in adult   Carotid stenosis, asymptomatic, left   Acute CVA -Embolic, unknown source -Patient presented with left-sided weakness after dialysis. He did not receive TPA  due to mild deficits -Neurology consulted and appreciated -Cerebral arteriogram showed 90% stenosis of the proximal left ICA. S/p stent assisted  angioplasty of high grade stenosis of prox Lt ICA with distal protection by IR  -Continue aspirin, brilinta, statin  Left ICA stenosis -S/p cerebral angiogram- stent assisted angioplasty of high grade stenosis of prox L ICA with distal protection 3/6 -Dr. Estanislado Pandy recommending discontinuing Plavix, and starting Brilinta and has been following P2Y12   Acute thrombocytopenia  -Platelets dropped to 46; improving  -Possibly secondary Aggrastat -Heparin has been discontinued-unlikely HIT given that patient has had heparin exposure with dialysis -DIC panel obtained: DDimer 2.22 fibrinogen 541, INR WNL no schistocytes on smear -Resolved   Hypoxia -DDimer was elevated 2.22 -Patient has been placed on 3L of O2 for supposed O2 saturation of 86% this morning (however, not documented) -CTA chest negative for PE -On room air on examination today   NSTEMI -Patient did have chest pain the night of 03/03/2018 03/04/2018, with a peak troponin of 6.4 -Cardiology consulted and appreciated -Status post cardiac catheterization on 03/06/2018 with PCI/DES x1 to the LAD with scoring balloon to ISR in the ostium of OM1.  Continue to treat RCA disease medically. -Patient will need DAPT with aspirin and Plavix for at least 1 year once done with Brilinta as per interventional radiology recommendation -Cardiology recommended 30-day event monitor with TEE/possible ICD evaluation on outpatient basis through his primary cardiologist, Dr. Beatrix Fetters -Of note, TEE was attempted but aborted on 03/03/2018, as patient was unable to  be sedated adequately -No further complaints of chest pain  Chronic systolic congestive heart failure -Echocardiogram on 02/28/2018 showed an EF of 30 to 35%, cavity size mildly dilated.  Diffuse hypokinesis.  Impaired relaxation of left  ventricle -Continue volume management with dialysis -Follow-up with cardiology as an outpatient  ESRD -Patient dialyzes Monday Wednesday Friday -Nephrology consulted and appreciated -Pending dialysis catheter exchange today by IR  Anemia of chronic disease -FOBT was positive however does not appear to be actively bleeding. -Previous hospitalist discussed with gastroenterologist, Dr. Silverio Decamp on 03/13/2018, given his recent MI and stent placement, heme positive, would recommend outpatient follow-up for colonoscopy -Received 2u PRBC in the past few days -Hemoglobin currently stable  Right renal mass -Incidental finding from abdominal ultrasound on 02/28/2018: Small mass in the lower pole of the right kidney, could reflect complex cyst or solid lesion.  Noncontrast MRI best available imaging option. -Will need to follow-up as an outpatient  Essential hypertension -Stable, continue amlodipine, losartan, metoprolol  HLD -Continue lipitor   Failure to thrive/physical deconditioning  -Likely due to prolonged hospitalization -Patient has progressed with physical therapy, now recommending home health  Peripheral artery disease -Incidental finding of severe calcification, possible occlusion of bilateral femoral artery, popliteal artery.  Discussed finding with vascular surgery on-call 3/11, patient may follow-up with them as an outpatient.  He does not have any open wounds of his bilateral lower extremities   DVT prophylaxis: SCD Code Status: Full Family Communication: Mother and sister at bedside.  Left voicemail for daughter Disposition Plan: Home health 3/13 after dialysis   Consultants:   Neurology  IR  Nephrology  GI  Cardiology   Antimicrobials:  Anti-infectives (From admission, onward)   Start     Dose/Rate Route Frequency Ordered Stop   03/25/18 1530  ceFAZolin (ANCEF) IVPB 2g/100 mL premix     2 g 200 mL/hr over 30 Minutes Intravenous To Radiology 03/25/18  1516 03/26/18 1530   03/20/18 0000  ceFAZolin (ANCEF) IVPB 2g/100 mL premix     2 g 200 mL/hr over 30 Minutes Intravenous 60 min pre-op 03/18/18 0112 03/20/18 1155   03/17/18 0600  ceFAZolin (ANCEF) IVPB 2g/100 mL premix  Status:  Discontinued     2 g 200 mL/hr over 30 Minutes Intravenous To Short Stay 03/16/18 0729 03/17/18 0950       Objective: Vitals:   03/25/18 1940 03/25/18 2306 03/26/18 0349 03/26/18 0752  BP: 128/65 (!) 137/57 (!) 143/71 (!) 147/60  Pulse: 71 69 73 72  Resp: 18 18 18 18   Temp: 98.2 F (36.8 C) 98.2 F (36.8 C) 98.4 F (36.9 C) 98.1 F (36.7 C)  TempSrc: Oral Oral Oral Oral  SpO2: 100% 100% 100% 97%  Weight:      Height:        Intake/Output Summary (Last 24 hours) at 03/26/2018 1159 Last data filed at 03/25/2018 1247 Gross per 24 hour  Intake --  Output 2033 ml  Net -2033 ml   Filed Weights   03/23/18 1039 03/25/18 0800 03/25/18 1247  Weight: 66 kg 67.1 kg 65.5 kg    Examination: General exam: Appears calm and comfortable  Respiratory system: Clear to auscultation. Respiratory effort normal. Cardiovascular system: S1 & S2 heard, RRR. No JVD, murmurs, rubs, gallops or clicks. No pedal edema. Gastrointestinal system: Abdomen is nondistended, soft and nontender. No organomegaly or masses felt. Normal bowel sounds heard. Central nervous system: Alert and oriented. No focal neurological deficits. Extremities: Symmetric 5 x 5 power. Skin: No  rashes, lesions or ulcers, no open wounds of the bilateral lower extremities Psychiatry: Judgement and insight appear normal. Mood & affect appropriate.    Data Reviewed: I have personally reviewed following labs and imaging studies  CBC: Recent Labs  Lab 03/20/18 0611 03/21/18 0423  03/22/18 1217 03/23/18 0532 03/24/18 0406 03/25/18 0430 03/26/18 0402  WBC 11.0* 9.5   < > 9.2 10.8* 8.9 10.0 10.4  NEUTROABS 5.6 6.1  --   --   --   --   --   --   HGB 10.5* 7.8*   < > 8.9* 8.9* 9.1* 8.7* 8.8*  HCT  31.7* 22.8*   < > 26.3* 27.4* 28.2* 27.2* 27.0*  MCV 91.6 91.9   < > 89.8 90.7 91.3 92.5 93.1  PLT 278 46*   < > 68*   67* 81* 105* 168 231   < > = values in this interval not displayed.   Basic Metabolic Panel: Recent Labs  Lab 03/21/18 0423 03/22/18 1217 03/23/18 0532 03/24/18 0406 03/25/18 0430 03/26/18 0402  NA 132* 134* 134* 136 137 137  K 4.9 3.9 4.3 3.7 4.1 4.1  CL 97* 96* 98 97* 99 100  CO2 17* 24 20* 26 24 26   GLUCOSE 135* 166* 130* 130* 122* 142*  BUN 52* 33* 42* 26* 41* 37*  CREATININE 11.86* 8.85* 10.70* 7.95* 10.29* 9.26*  CALCIUM 8.0* 8.3* 8.3* 8.3* 8.5* 8.2*  PHOS 5.2*  --   --  4.5 4.6  --    GFR: Estimated Creatinine Clearance: 6.9 mL/min (A) (by C-G formula based on SCr of 9.26 mg/dL (H)). Liver Function Tests: Recent Labs  Lab 03/21/18 0423 03/24/18 0406 03/25/18 0430  ALBUMIN 2.7* 2.6* 2.7*   No results for input(s): LIPASE, AMYLASE in the last 168 hours. No results for input(s): AMMONIA in the last 168 hours. Coagulation Profile: Recent Labs  Lab 03/22/18 1217  INR 1.2   Cardiac Enzymes: No results for input(s): CKTOTAL, CKMB, CKMBINDEX, TROPONINI in the last 168 hours. BNP (last 3 results) No results for input(s): PROBNP in the last 8760 hours. HbA1C: No results for input(s): HGBA1C in the last 72 hours. CBG: Recent Labs  Lab 03/23/18 1051  GLUCAP 100*   Lipid Profile: No results for input(s): CHOL, HDL, LDLCALC, TRIG, CHOLHDL, LDLDIRECT in the last 72 hours. Thyroid Function Tests: No results for input(s): TSH, T4TOTAL, FREET4, T3FREE, THYROIDAB in the last 72 hours. Anemia Panel: No results for input(s): VITAMINB12, FOLATE, FERRITIN, TIBC, IRON, RETICCTPCT in the last 72 hours. Sepsis Labs: No results for input(s): PROCALCITON, LATICACIDVEN in the last 168 hours.  No results found for this or any previous visit (from the past 240 hour(s)).     Radiology Studies: Ct Angio Chest Pe W Or Wo Contrast  Result Date:  03/24/2018 CLINICAL DATA:  Pt admitted in February as a Code stroke. Pt still hasn't been discharged; Pt c/o SOB, with elevated D-Dimer; Hx of ESRD, denies prior PE, DVT EXAM: CT ANGIOGRAPHY CHEST WITH CONTRAST TECHNIQUE: Multidetector CT imaging of the chest was performed using the standard protocol during bolus administration of intravenous contrast. Multiplanar CT image reconstructions and MIPs were obtained to evaluate the vascular anatomy. CONTRAST:  23mL OMNIPAQUE IOHEXOL 350 MG/ML SOLN COMPARISON:  11/30/2017 FINDINGS: Cardiovascular: Four-chamber cardiac enlargement. No pericardial effusion. Dilated central pulmonary arteries. Satisfactory opacification of pulmonary arteries noted, and there is no evidence of pulmonary emboli. Extensive coronary calcifications. Fair contrast opacification of thoracic aorta. No aneurysm. No suggestion of dissection  or stenosis although evaluation is limited. Moderate calcified atheromatous plaque throughout. The visualized proximal abdominal aorta is atheromatous, otherwise unremarkable. Mediastinum/Nodes: No hilar or mediastinal adenopathy. Lungs/Pleura: Trace bilateral pleural effusions. The loculated fluid previously seen in the left major fissure has nearly completely resolved. No pneumothorax. Emphysema with multiple subpleural blebs. Linear scarring or subsegmental atelectasis and mild adjacent ground-glass opacities in the dependent aspect of both lower lobes. Upper Abdomen: Parenchymal calcifications in the visualized portions of both kidneys, incompletely characterized. No suggestion of hydronephrosis. No acute findings. Musculoskeletal: Healing sternal fracture, nondisplaced. Healing right fourth and fifth rib fractures, and left fourth rib fracture. Review of the MIP images confirms the above findings. IMPRESSION: 1. Negative for acute PE. 2. Four-chamber cardiac enlargement with dilated central pulmonary arteries suggesting pulmonary arterial hypertension. 3.  Coronary and Aortic Atherosclerosis (ICD10-I70.0) and Emphysema (ICD10-J43.9). 4. Trace bilateral pleural effusions. 5. Healing sternal and bilateral rib fractures. Electronically Signed   By: Lucrezia Europe M.D.   On: 03/24/2018 12:24   Vas [place] Lower Extremity Venous (dvt)  Result Date: 03/24/2018  Lower Venous Study Indications: SOB, and elevated D-dimer.  Limitations: Body habitus and shadowing from calcification of adjacent arteries. Comparison Study: No comparison study available Performing Technologist: Rudell Cobb  Examination Guidelines: A complete evaluation includes B-mode imaging, spectral Doppler, color Doppler, and power Doppler as needed of all accessible portions of each vessel. Bilateral testing is considered an integral part of a complete examination. Limited examinations for reoccurring indications may be performed as noted.  Right Venous Findings: +---------+---------------+---------+-----------+----------+--------------+            Compressibility Phasicity Spontaneity Properties Summary         +---------+---------------+---------+-----------+----------+--------------+  CFV       Full            Yes       Yes                                    +---------+---------------+---------+-----------+----------+--------------+  SFJ       Full                                                             +---------+---------------+---------+-----------+----------+--------------+  FV Prox   Full                                                             +---------+---------------+---------+-----------+----------+--------------+  FV Mid    Full                                                             +---------+---------------+---------+-----------+----------+--------------+  FV Distal Full                                                             +---------+---------------+---------+-----------+----------+--------------+  PFV                                                        Not visualized   +---------+---------------+---------+-----------+----------+--------------+  POP       Full            Yes       Yes                                    +---------+---------------+---------+-----------+----------+--------------+  PTV       Full                                                             +---------+---------------+---------+-----------+----------+--------------+  PERO      Full                                                             +---------+---------------+---------+-----------+----------+--------------+ Incidental finding: Severe calcification and possible occlusion of FA,POP A.  Left Venous Findings: +---------+---------------+---------+-----------+----------+--------------+            Compressibility Phasicity Spontaneity Properties Summary         +---------+---------------+---------+-----------+----------+--------------+  CFV       Full            Yes       Yes                                    +---------+---------------+---------+-----------+----------+--------------+  SFJ       Full                                                             +---------+---------------+---------+-----------+----------+--------------+  FV Prox   Full                                                             +---------+---------------+---------+-----------+----------+--------------+  FV Mid    Full                                                             +---------+---------------+---------+-----------+----------+--------------+  FV Distal Full                                                             +---------+---------------+---------+-----------+----------+--------------+  PFV                                                        Not visualized  +---------+---------------+---------+-----------+----------+--------------+  POP       Full            Yes       Yes                                    +---------+---------------+---------+-----------+----------+--------------+  PTV       Full                                                              +---------+---------------+---------+-----------+----------+--------------+  PERO      Full                                                             +---------+---------------+---------+-----------+----------+--------------+ Incidental finding: Severe calcification and possible occlusion of FA,POP A.    Summary: Right: There is no evidence of deep vein thrombosis in the lower extremity. However, portions of this examination were limited- see technologist comments above. No cystic structure found in the popliteal fossa. Left: There is no evidence of deep vein thrombosis in the lower extremity. However, portions of this examination were limited- see technologist comments above. No cystic structure found in the popliteal fossa. Incidental findings: Severe calcification and possible occlusion of FA,POP A bilaterally.  *See table(s) above for measurements and observations. Electronically signed by Deitra Mayo MD on 03/24/2018 at 8:34:34 PM.    Final       Scheduled Meds:  amLODipine  10 mg Oral QHS   aspirin  81 mg Oral Daily   Or   aspirin  81 mg Per Tube Daily   atorvastatin  40 mg Oral q1800   azelastine  1 spray Each Nare BID   Chlorhexidine Gluconate Cloth  6 each Topical Q0600   darbepoetin (ARANESP) injection - DIALYSIS  100 mcg Intravenous Q Fri-HD   famotidine  20 mg Oral QHS   fluticasone  2 spray Each Nare BID   isosorbide mononitrate  60 mg Oral Daily   loratadine  10 mg Oral Q48H   losartan  100 mg Oral Daily   metoprolol succinate  200 mg Oral Daily   multivitamin  1 tablet Oral Daily   polyethylene glycol  17 g Oral Daily   senna-docusate  1 tablet Oral BID   ticagrelor  90 mg Oral BID   Continuous Infusions:  sodium chloride 10 mL/hr at 03/20/18 1101    ceFAZolin (ANCEF) IV       LOS: 26 days    Time spent: 79minutes   Dessa Phi, DO [facility] [email or web address] 03/26/2018, 11:59 AM

## 2018-03-26 NOTE — Progress Notes (Signed)
Occupational Therapy Treatment Patient Details Name: Peter [name] MRN: [id number] DOB: Aug 16, 1945 Today's Date: 03/26/2018    History of present illness Pt is a 73 y.o. male admitted 02/27/18 with L-sided weakness while at dialysis. MRI shows small volume diffusion abnormality involving parasagittal cerebral hemispheres, additional L frontal cortical involvement compatible with acute ischemia; remote bilat cerebellar infarcts. Pt with 2x NSTEMIs since admission. S/p PCI of LAD on 2/21. 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 s/p left ICA stent placement 03/20/18. PMH includes HTN, ESRD on HD.   OT comments  Pt making progress towards occupational therapy goals. Pt reporting feeling weak secondary to being NPO for test this morning. Pt standing with min guard for balance from chair. Pt ambulating with close supervision and use of RW to sink for grooming tasks. Pt ambulates 100' in hall at supervision level as well. Increased coordination and dexterity noted with self care tasks this session. Pt returning to recliner chair at end of session.   Follow Up Recommendations  Home health OT;Supervision - Intermittent    Equipment Recommendations  None recommended by OT    Recommendations for Other Services      Precautions / Restrictions Precautions Precautions: Fall Restrictions Weight Bearing Restrictions: No       Mobility Bed Mobility      General bed mobility comments: Sitting in chair upon OT arrival.   Transfers Overall transfer level: Needs assistance Equipment used: Rolling walker (2 wheeled) Transfers: Sit to/from Stand Sit to Stand: Min guard Stand pivot transfers: Supervision       General transfer comment: Min guard for safety. Stood from Youth worker.     Balance Overall balance assessment: Needs assistance Sitting-balance support: No upper extremity supported;Feet  supported Sitting balance-Leahy Scale: Good     Standing balance support: During functional activity Standing balance-Leahy Scale: Fair Standing balance comment: Does better with UE support for dynamic tasks      ADL either performed or assessed with clinical judgement   ADL Overall ADL's : Needs assistance/impaired     Grooming: Supervision/safety;Wash/dry hands;Wash/dry face;Oral care Grooming Details (indicate cue type and reason): close supervision while standing at sink for grooming tasks                    Cognition Arousal/Alertness: Awake/alert Behavior During Therapy: WFL for tasks assessed/performed Overall Cognitive Status: Within Functional Limits for tasks assessed                        Pertinent Vitals/ Pain       Pain Assessment: No/denies pain         Frequency  Min 2X/week        Progress Toward Goals  OT Goals(current goals can now be found in the care plan section)  Progress towards OT goals: Progressing toward goals  Acute Rehab OT Goals Patient Stated Goal: get this test over so I can eat ( NPO) OT Goal Formulation: With patient Time For Goal Achievement: 04/09/18 Potential to Achieve Goals: Good  Plan Discharge plan remains appropriate       AM-PAC OT "6 Clicks" Daily Activity     Outcome Measure   Help from another person eating meals?: A Little Help from another person taking care of personal grooming?: A Little Help from another person toileting, which includes using toliet, bedpan, or urinal?: A Little Help from another person bathing (including washing,  rinsing, drying)?: A Little Help from another person to put on and taking off regular upper body clothing?: A Little Help from another person to put on and taking off regular lower body clothing?: A Little 6 Click Score: 18    End of Session Equipment Utilized During Treatment: Rolling walker  OT Visit Diagnosis: Unsteadiness on feet (R26.81);Muscle weakness  (generalized) (M62.81) Hemiplegia - Right/Left: Left Hemiplegia - dominant/non-dominant: Non-Dominant Hemiplegia - caused by: Cerebral infarction   Activity Tolerance Patient limited by fatigue   Patient Left in chair;with call bell/phone within reach   Nurse Communication          Time: 4932-4199 OT Time Calculation (min): 15 min  Charges: OT General Charges $OT Visit: 1 Visit OT Treatments $Self Care/Home Management : 8-22 mins  Gypsy Decant , Ms, OTR/L 03/26/2018, 11:01 AM

## 2018-03-27 ENCOUNTER — Inpatient Hospital Stay (HOSPITAL_COMMUNITY): Payer: Medicare HMO

## 2018-03-27 ENCOUNTER — Encounter (HOSPITAL_COMMUNITY): Payer: Self-pay | Admitting: Interventional Radiology

## 2018-03-27 HISTORY — PX: IR FLUORO GUIDE CV LINE RIGHT: IMG2283

## 2018-03-27 LAB — BASIC METABOLIC PANEL
Anion gap: 14 (ref 5–15)
BUN: 48 mg/dL — ABNORMAL HIGH (ref 8–23)
CO2: 23 mmol/L (ref 22–32)
Calcium: 8.6 mg/dL — ABNORMAL LOW (ref 8.9–10.3)
Chloride: 101 mmol/L (ref 98–111)
Creatinine, Ser: 11.78 mg/dL — ABNORMAL HIGH (ref 0.61–1.24)
GFR calc Af Amer: 4 mL/min — ABNORMAL LOW (ref >60)
GFR, EST NON AFRICAN AMERICAN: 4 mL/min — AB (ref >60)
Glucose, Bld: 113 mg/dL — ABNORMAL HIGH (ref 70–99)
Potassium: 4.3 mmol/L (ref 3.5–5.1)
SODIUM: 138 mmol/L (ref 135–145)

## 2018-03-27 MED ORDER — CEFAZOLIN SODIUM-DEXTROSE 2-4 GM/100ML-% IV SOLN
INTRAVENOUS | Status: AC
  Filled 2018-03-27: qty 100

## 2018-03-27 MED ORDER — HEPARIN SODIUM (PORCINE) 1000 UNIT/ML IJ SOLN
INTRAMUSCULAR | Status: AC
  Filled 2018-03-27: qty 1

## 2018-03-27 MED ORDER — HEPARIN SODIUM (PORCINE) 1000 UNIT/ML IJ SOLN
3200.0000 [IU] | Freq: Once | INTRAMUSCULAR | Status: AC
  Administered 2018-03-27: 3200 [IU]

## 2018-03-27 MED ORDER — HEPARIN SODIUM (PORCINE) 1000 UNIT/ML IJ SOLN
3000.0000 [IU] | Freq: Once | INTRAMUSCULAR | Status: AC
  Administered 2018-03-27: 3000 [IU] via INTRAVENOUS
  Filled 2018-03-27: qty 3

## 2018-03-27 MED ORDER — LIDOCAINE HCL 1 % IJ SOLN
INTRAMUSCULAR | Status: AC
  Filled 2018-03-27: qty 20

## 2018-03-27 MED ORDER — GELATIN ABSORBABLE 12-7 MM EX MISC
CUTANEOUS | Status: AC
  Filled 2018-03-27: qty 1

## 2018-03-27 MED ORDER — HEPARIN SODIUM (PORCINE) 1000 UNIT/ML IJ SOLN
INTRAMUSCULAR | Status: AC
  Filled 2018-03-27: qty 3

## 2018-03-27 MED ORDER — CHLORHEXIDINE GLUCONATE 4 % EX LIQD
CUTANEOUS | Status: AC
  Filled 2018-03-27: qty 15

## 2018-03-27 MED ORDER — LIDOCAINE HCL (PF) 1 % IJ SOLN
INTRAMUSCULAR | Status: DC | PRN
  Administered 2018-03-27: 5 mL

## 2018-03-27 MED ORDER — DARBEPOETIN ALFA 100 MCG/0.5ML IJ SOSY
PREFILLED_SYRINGE | INTRAMUSCULAR | Status: AC
  Filled 2018-03-27: qty 0.5

## 2018-03-27 NOTE — Progress Notes (Signed)
[facility] Progress Note   Assessment/ Plan:   [facility]  MWF 3.5h R IJ TDC 149lbs (67.5kg)  Hep 2700 then 500u/hr - hect 1.5 ug IV tiw - ferrlicit - no esa   Assessment/Plan: 1. 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 Brilinta/ asa.  2. Thrombocytopenia, acute - 278 --> 46 (3/7), now up to 81--> 105--> 168; resolved.  No schistocytes on smear, INR 1.2, fibrinogen WNL, not likely DIC or TTP.  Likely effect of aggrastat, per primary.  Had not been receiving heparin in HD d/t thrombocytopenia; now that they are normalized can add back.   3. NSTEMI- s/p PCI of LAD on 03/06/18. Per primary.  4. ESRDon HD qMWF. HD on schedule can use heparin.   For HD cath exchange 3/13. Per pt and dtr, had been on PD until this fall until he had a respiratory arrest in the setting of performing manual drains instead of cycler prescription.  He has a [facility] for now in hopes that one day he will be able to return to PD.  He still has the PD catheter in place; flush his PD catheter weekly and perform exit site care.   5. HTN/volume: on losartan 100/ metop xl 200 qd/ amlodipine 10/qd. BP's stable 6. Anemia of ckd/ blood draws/ guiac+: Hb stable 8.8 today.  Got 1u prbc 3/5 and another 1 u pRBCs 3/7.  Aranesp qFri 7. Anemia/ Guiac+ - not active bleed, seen by GI 2/28, no intervention now given recent MI/ stent 8. CKD-MBD:stable 9. Nutrition:renal diet 10. Hypoxia/AMS- appears improved after HD.  D/c'd benadryl in case contributory to AMS.  D/w primary, given elevated Ddimer and repeated hypoxia will r/o PE- no evidence 11. PAD-incidentally noted was severe calcification and possible occlusion of femoral and popliteal arteries bilaterally.  Could consider VVS c/s 12. Disposition: pending  Subjective:     TDC exchange postponed to today  NO other issues   Objective:   BP (!) 160/62 (BP Location: Right Arm)   Pulse 72   Temp 98.9 F (37.2  C) (Oral)   Resp 18   Ht 6\' 3"  (1.905 m)   Wt 65.5 kg   SpO2 100%   BMI 18.05 kg/m   Physical Exam: Gen: NAD CVS: RRR with loud S2 Resp: clear bilaterally to the bases Abd: soft, PD cath in the RUQ, dressing in place Ext: no LE edema ACCESS: R IJ TDC  Labs: BMET Recent Labs  Lab 03/21/18 0423 03/22/18 1217 03/23/18 0532 03/24/18 0406 03/25/18 0430 03/26/18 0402 03/27/18 0549  NA 132* 134* 134* 136 137 137 138  K 4.9 3.9 4.3 3.7 4.1 4.1 4.3  CL 97* 96* 98 97* 99 100 101  CO2 17* 24 20* 26 24 26 23   GLUCOSE 135* 166* 130* 130* 122* 142* 113*  BUN 52* 33* 42* 26* 41* 37* 48*  CREATININE 11.86* 8.85* 10.70* 7.95* 10.29* 9.26* 11.78*  CALCIUM 8.0* 8.3* 8.3* 8.3* 8.5* 8.2* 8.6*  PHOS 5.2*  --   --  4.5 4.6  --   --    CBC Recent Labs  Lab 03/21/18 0423  03/23/18 0532 03/24/18 0406 03/25/18 0430 03/26/18 0402  WBC 9.5   < > 10.8* 8.9 10.0 10.4  NEUTROABS 6.1  --   --   --   --   --   HGB 7.8*   < > 8.9* 9.1* 8.7* 8.8*  HCT 22.8*   < >  27.4* 28.2* 27.2* 27.0*  MCV 91.9   < > 90.7 91.3 92.5 93.1  PLT 46*   < > 81* 105* 168 231   < > = values in this interval not displayed.    @IMGRELPRIORS @ Medications:    . amLODipine  10 mg Oral QHS  . aspirin  81 mg Oral Daily   Or  . aspirin  81 mg Per Tube Daily  . atorvastatin  40 mg Oral q1800  . azelastine  1 spray Each Nare BID  . Chlorhexidine Gluconate Cloth  6 each Topical Q0600  . darbepoetin (ARANESP) injection - DIALYSIS  100 mcg Intravenous Q Fri-HD  . famotidine  20 mg Oral QHS  . fluticasone  2 spray Each Nare BID  . isosorbide mononitrate  60 mg Oral Daily  . loratadine  10 mg Oral Q48H  . losartan  100 mg Oral Daily  . metoprolol succinate  200 mg Oral Daily  . multivitamin  1 tablet Oral Daily  . polyethylene glycol  17 g Oral Daily  . senna-docusate  1 tablet Oral BID  . ticagrelor  90 mg Oral BID     [facility] 03/27/2018, 10:01 AM

## 2018-03-27 NOTE — Procedures (Signed)
Interventional Radiology Procedure Note  Procedure: Exchange for a new right IJ tunneled HD catheter (23 cm Palindrome). Tip in mid RA and ready for use.   Complications: NOne  Estimated Blood Loss: <25 mL  Recommendations: - Catheter ready for use.   Signed,  Criselda Peaches, MD

## 2018-03-27 NOTE — Plan of Care (Signed)
  Problem: Activity: Goal: Ability to tolerate increased activity will improve Outcome: Progressing   Problem: Health Behavior/Discharge Planning: Goal: Ability to manage health-related needs will improve Outcome: Progressing

## 2018-03-27 NOTE — Progress Notes (Signed)
PROGRESS NOTE    Peter [name]  JEH:[id number] DOB: Feb 02, 1945 DOA: 02/27/2018 PCP: Patient, No Pcp Per     Brief Narrative:  Peter [name] is a 73 y.o.malewitha past medical history of ESRD on HD, hypertensionbeing brought to the hospital from his dialysis center due to strokelike symptoms with left sided weakness. Last known normal 3:15 this afternoon. Patient states after finishing dialysis today he felt weak and could not get out of the chair. Both of his legs felt weak and his right leg was shaking. He could not lift his arms. States he was told that his left arm was not moving. Denies any history of prior stroke. Work up revealed acute stroke, neurology consulted.  Also noted to have an NSTEMI on 03/04/2018 with chest pain.  Status post DES.  S/p Left ICA stent placement.  Due to increase in oxygen demand, patient underwent CTA chest which was negative for PE.  DVT ultrasound revealed severe calcification, possible occlusion of bilateral femoral artery, popliteal artery. Discussed with vascular surgery, appropriate for outpatient follow up.   New events last 24 hours / Subjective: Planning for [facility] exchange this afternoon. No new complaints.   Assessment & Plan:   Principal Problem:   Cerebrovascular accident (CVA) ([place]) Active Problems:   Elevated troponin   Elevated LFTs   HTN (hypertension)   ESRD on dialysis ([place])   CAD (coronary artery disease)   Diabetes (HCC)   CHF (congestive heart failure) ([place])   Traumatic brain injury ([place])   Anemia of chronic disease   Right kidney mass   TIA (transient ischemic attack)   Chronic systolic CHF (congestive heart failure) (HCC)   NSTEMI (non-ST elevated myocardial infarction) ([place])   Carotid stenosis   Status post coronary artery stent placement   FTT (failure to thrive) in adult   Carotid stenosis, asymptomatic, left   Acute CVA -Embolic, unknown source -Patient presented with left-sided weakness after dialysis. He did not  receive TPA due to mild deficits -Neurology consulted and appreciated -Cerebral arteriogram showed 90% stenosis of the proximal left ICA. S/p stent assisted  angioplasty of high grade stenosis of prox Lt ICA with distal protection by IR  -Continue aspirin, brilinta, statin  Left ICA stenosis -S/p cerebral angiogram- stent assisted angioplasty of high grade stenosis of prox L ICA with distal protection 3/6 -Dr. Estanislado Pandy recommending discontinuing Plavix, and starting Brilinta and has been following P2Y12   Acute thrombocytopenia  -Platelets dropped to 46; improving  -Possibly secondary Aggrastat -Heparin has been discontinued-unlikely HIT given that patient has had heparin exposure with dialysis -DIC panel obtained: DDimer 2.22 fibrinogen 541, INR WNL no schistocytes on smear -Resolved   Acute hypoxemic respiratory failure  -DDimer was elevated 2.22 -Patient has been placed on 3L of O2 for supposed O2 saturation of 86% this morning (however, not documented) -CTA chest negative for PE -Resolved. On room air now   NSTEMI -Patient did have chest pain the night of 03/03/2018 03/04/2018, with a peak troponin of 6.4 -Cardiology consulted and appreciated -Status post cardiac catheterization on 03/06/2018 with PCI/DES x1 to the LAD with scoring balloon to ISR in the ostium of OM1.  Continue to treat RCA disease medically. -Patient will need DAPT with aspirin and Plavix for at least 1 year once done with Brilinta as per interventional radiology recommendation -Cardiology recommended 30-day event monitor with TEE/possible ICD evaluation on outpatient basis through his primary cardiologist, Dr. Beatrix Fetters -Of note, TEE was attempted but aborted on 03/03/2018, as patient was unable  to be sedated adequately -No further complaints of chest pain  Chronic systolic congestive heart failure -Echocardiogram on 02/28/2018 showed an EF of 30 to 35%, cavity size mildly dilated.  Diffuse hypokinesis.  Impaired  relaxation of left ventricle -Continue volume management with dialysis -Follow-up with cardiology as an outpatient  ESRD -Patient dialyzes Monday Wednesday Friday -Nephrology consulted and appreciated -Pending dialysis catheter exchange today by IR  Anemia of chronic disease -FOBT was positive however does not appear to be actively bleeding. -Previous hospitalist discussed with gastroenterologist, Dr. Silverio Decamp on 03/13/2018, given his recent MI and stent placement, heme positive, would recommend outpatient follow-up for colonoscopy -Received 2u PRBC in the past few days -Hemoglobin currently stable  Right renal mass -Incidental finding from abdominal ultrasound on 02/28/2018: Small mass in the lower pole of the right kidney, could reflect complex cyst or solid lesion.  Noncontrast MRI best available imaging option. -Will need to follow-up as an outpatient  Essential hypertension -Stable, continue amlodipine, losartan, metoprolol  HLD -Continue lipitor   Failure to thrive/physical deconditioning  -Likely due to prolonged hospitalization -Patient has progressed with physical therapy  Peripheral artery disease -Incidental finding of severe calcification, possible occlusion of bilateral femoral artery, popliteal artery.  Discussed finding with vascular surgery on-call 3/11, patient may follow-up with them as an outpatient.  He does not have any open wounds of his bilateral lower extremities   DVT prophylaxis: SCD Code Status: Full Family Communication: Spoke with daughter over the phone  Disposition Plan: Home health 3/14   Consultants:   Neurology  IR  Nephrology  GI  Cardiology   Antimicrobials:  Anti-infectives (From admission, onward)   Start     Dose/Rate Route Frequency Ordered Stop   03/25/18 1530  ceFAZolin (ANCEF) IVPB 2g/100 mL premix     2 g 200 mL/hr over 30 Minutes Intravenous To Radiology 03/25/18 1516 03/25/18 1600   03/20/18 0000  ceFAZolin  (ANCEF) IVPB 2g/100 mL premix     2 g 200 mL/hr over 30 Minutes Intravenous 60 min pre-op 03/18/18 0112 03/20/18 1155   03/17/18 0600  ceFAZolin (ANCEF) IVPB 2g/100 mL premix  Status:  Discontinued     2 g 200 mL/hr over 30 Minutes Intravenous To Short Stay 03/16/18 0729 03/17/18 0950       Objective: Vitals:   03/26/18 2034 03/27/18 0029 03/27/18 0411 03/27/18 0800  BP: (!) 171/61 (!) 153/63 (!) 136/48 (!) 160/62  Pulse: 69 71 65 72  Resp: 18 18 16 18   Temp: 98.1 F (36.7 C) 99 F (37.2 C) 98.9 F (37.2 C)   TempSrc: Oral Oral Oral   SpO2: 100% 100% 98% 100%  Weight:      Height:    6\' 3"  (1.905 m)    Intake/Output Summary (Last 24 hours) at 03/27/2018 1142 Last data filed at 03/27/2018 0800 Gross per 24 hour  Intake 460 ml  Output -  Net 460 ml   Filed Weights   03/23/18 1039 03/25/18 0800 03/25/18 1247  Weight: 66 kg 67.1 kg 65.5 kg    Examination: General exam: Appears calm and comfortable  Respiratory system: Clear to auscultation. Respiratory effort normal. Cardiovascular system: S1 & S2 heard, RRR. No JVD, murmurs, rubs, gallops or clicks. No pedal edema. Gastrointestinal system: Abdomen is nondistended, soft and nontender. No organomegaly or masses felt. Normal bowel sounds heard. Central nervous system: Alert and oriented. No focal neurological deficits. Extremities: Symmetric 5 x 5 power. Skin: No rashes, lesions or ulcers Psychiatry: Judgement and  insight appear normal. Mood & affect appropriate.    Data Reviewed: I have personally reviewed following labs and imaging studies  CBC: Recent Labs  Lab 03/21/18 0423  03/22/18 1217 03/23/18 0532 03/24/18 0406 03/25/18 0430 03/26/18 0402  WBC 9.5   < > 9.2 10.8* 8.9 10.0 10.4  NEUTROABS 6.1  --   --   --   --   --   --   HGB 7.8*   < > 8.9* 8.9* 9.1* 8.7* 8.8*  HCT 22.8*   < > 26.3* 27.4* 28.2* 27.2* 27.0*  MCV 91.9   < > 89.8 90.7 91.3 92.5 93.1  PLT 46*   < > 68*  67* 81* 105* 168 231   < > =  values in this interval not displayed.   Basic Metabolic Panel: Recent Labs  Lab 03/21/18 0423  03/23/18 0532 03/24/18 0406 03/25/18 0430 03/26/18 0402 03/27/18 0549  NA 132*   < > 134* 136 137 137 138  K 4.9   < > 4.3 3.7 4.1 4.1 4.3  CL 97*   < > 98 97* 99 100 101  CO2 17*   < > 20* 26 24 26 23   GLUCOSE 135*   < > 130* 130* 122* 142* 113*  BUN 52*   < > 42* 26* 41* 37* 48*  CREATININE 11.86*   < > 10.70* 7.95* 10.29* 9.26* 11.78*  CALCIUM 8.0*   < > 8.3* 8.3* 8.5* 8.2* 8.6*  PHOS 5.2*  --   --  4.5 4.6  --   --    < > = values in this interval not displayed.   GFR: Estimated Creatinine Clearance: 5.4 mL/min (A) (by C-G formula based on SCr of 11.78 mg/dL (H)). Liver Function Tests: Recent Labs  Lab 03/21/18 0423 03/24/18 0406 03/25/18 0430  ALBUMIN 2.7* 2.6* 2.7*   No results for input(s): LIPASE, AMYLASE in the last 168 hours. No results for input(s): AMMONIA in the last 168 hours. Coagulation Profile: Recent Labs  Lab 03/22/18 1217  INR 1.2   Cardiac Enzymes: No results for input(s): CKTOTAL, CKMB, CKMBINDEX, TROPONINI in the last 168 hours. BNP (last 3 results) No results for input(s): PROBNP in the last 8760 hours. HbA1C: No results for input(s): HGBA1C in the last 72 hours. CBG: Recent Labs  Lab 03/23/18 1051  GLUCAP 100*   Lipid Profile: No results for input(s): CHOL, HDL, LDLCALC, TRIG, CHOLHDL, LDLDIRECT in the last 72 hours. Thyroid Function Tests: No results for input(s): TSH, T4TOTAL, FREET4, T3FREE, THYROIDAB in the last 72 hours. Anemia Panel: No results for input(s): VITAMINB12, FOLATE, FERRITIN, TIBC, IRON, RETICCTPCT in the last 72 hours. Sepsis Labs: No results for input(s): PROCALCITON, LATICACIDVEN in the last 168 hours.  No results found for this or any previous visit (from the past 240 hour(s)).     Radiology Studies: No results found.    Scheduled Meds: . amLODipine  10 mg Oral QHS  . aspirin  81 mg Oral Daily   Or  .  aspirin  81 mg Per Tube Daily  . atorvastatin  40 mg Oral q1800  . azelastine  1 spray Each Nare BID  . Chlorhexidine Gluconate Cloth  6 each Topical Q0600  . darbepoetin (ARANESP) injection - DIALYSIS  100 mcg Intravenous Q Fri-HD  . famotidine  20 mg Oral QHS  . fluticasone  2 spray Each Nare BID  . isosorbide mononitrate  60 mg Oral Daily  . loratadine  10 mg Oral  Q48H  . losartan  100 mg Oral Daily  . metoprolol succinate  200 mg Oral Daily  . multivitamin  1 tablet Oral Daily  . polyethylene glycol  17 g Oral Daily  . senna-docusate  1 tablet Oral BID  . ticagrelor  90 mg Oral BID   Continuous Infusions: . sodium chloride 10 mL/hr at 03/20/18 1101     LOS: 27 days    Time spent: 15 minutes   Dessa Phi, DO [facility] [email or web address] 03/27/2018, 11:42 AM

## 2018-03-28 MED ORDER — ASPIRIN 81 MG PO CHEW: 81.0000 mg | CHEWABLE_TABLET | Freq: Every day | ORAL | 0 refills | Status: AC

## 2018-03-28 MED ORDER — AMLODIPINE BESYLATE 10 MG PO TABS: 10.0000 mg | ORAL_TABLET | Freq: Every day | ORAL | 0 refills | Status: AC

## 2018-03-28 MED ORDER — ISOSORBIDE MONONITRATE ER 60 MG PO TB24: 60.0000 mg | ORAL_TABLET | Freq: Every day | ORAL | 0 refills | Status: AC

## 2018-03-28 MED ORDER — TICAGRELOR 90 MG PO TABS: 90.0000 mg | ORAL_TABLET | Freq: Two times a day (BID) | ORAL | 0 refills | Status: AC

## 2018-03-28 MED ORDER — LIDOCAINE-EPINEPHRINE (PF) 1 %-1:200000 IJ SOLN
INTRAMUSCULAR | Status: AC
  Filled 2018-03-28: qty 30

## 2018-03-28 MED ORDER — LOSARTAN POTASSIUM 100 MG PO TABS: 100.0000 mg | ORAL_TABLET | Freq: Every day | ORAL | 0 refills | Status: AC

## 2018-03-28 MED ORDER — METOPROLOL SUCCINATE ER 200 MG PO TB24: 200.0000 mg | ORAL_TABLET | Freq: Every day | ORAL | 0 refills | Status: AC

## 2018-03-28 NOTE — Progress Notes (Signed)
[facility] Progress Note   Assessment/ Plan:   [facility]  MWF 3.5h R IJ TDC 149lbs (67.5kg)  Hep 2700 then 500u/hr - hect 1.5 ug IV tiw - ferrlicit - no esa   Assessment/Plan: 1. 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 Brilinta/ asa.  2. Thrombocytopenia, acute - 278 --> 46 (3/7), now up to 81--> 105--> 168-->231; resolved.  No schistocytes on smear, INR 1.2, fibrinogen WNL, not likely DIC or TTP.  Likely effect of aggrastat, per primary.  Had not been receiving heparin in HD d/t thrombocytopenia; now that they are normalized can add back.   3. NSTEMI- s/p PCI of LAD on 03/06/18. Per primary.  4. ESRDon HD qMWF. HD on schedule can use heparin.   S/p HD cath exchange 3/13. Per pt and dtr, had been on PD until this fall until he had a respiratory arrest in the setting of performing manual drains instead of cycler prescription.  He has a [facility] for now in hopes that one day he will be able to return to PD.  He still has the PD catheter in place; flush his PD catheter weekly and perform exit site care.  His HD catheter dressing needs to be changed and if it continues to ooze IR may need to place a stitch. 5. HTN/volume: on losartan 100/ metop xl 200 qd/ amlodipine 10/qd. BP's stable 6. Anemia of ckd/ blood draws/ guiac+: Hb stable 8.8 3/12.  Got 1u prbc 3/5 and another 1 u pRBCs 3/7.  Aranesp qFri 7. Anemia/ Guiac+ - not active bleed, seen by GI 2/28, no intervention now given recent MI/ stent 8. CKD-MBD:stable 9. Nutrition:renal diet 10. Hypoxia/AMS- resolved after HD.  D/c'd benadryl in case contributory to AMS.  D/w primary, given elevated Ddimer and repeated hypoxia will r/o PE- no evidence 11. PAD-incidentally noted was severe calcification and possible occlusion of femoral and popliteal arteries bilaterally.  Could consider VVS c/s v outpt f/u 12. Disposition: ok to d/c from nephrology perspective  Subjective:      S/p [facility] exchange yesterday then successful HD with 2L UF  Potential D/C today with HH   He has no particular complaints this AM except oozing around exit site of new HD cath   Objective:   BP (!) 118/50 (BP Location: Left Arm)   Pulse 62   Temp 98.2 F (36.8 C) (Oral)   Resp 16   Ht 6\' 3"  (1.905 m)   Wt 67 kg   SpO2 99%   BMI 18.46 kg/m   Physical Exam: Gen: NAD Resp: normal WOB Ext: no LE edema ACCESS: R IJ TDC with some blood on dressing  Labs: BMET Recent Labs  Lab 03/22/18 1217 03/23/18 0532 03/24/18 0406 03/25/18 0430 03/26/18 0402 03/27/18 0549  NA 134* 134* 136 137 137 138  K 3.9 4.3 3.7 4.1 4.1 4.3  CL 96* 98 97* 99 100 101  CO2 24 20* 26 24 26 23   GLUCOSE 166* 130* 130* 122* 142* 113*  BUN 33* 42* 26* 41* 37* 48*  CREATININE 8.85* 10.70* 7.95* 10.29* 9.26* 11.78*  CALCIUM 8.3* 8.3* 8.3* 8.5* 8.2* 8.6*  PHOS  --   --  4.5 4.6  --   --    CBC Recent Labs  Lab 03/23/18 0532 03/24/18 0406 03/25/18 0430 03/26/18 0402  WBC 10.8* 8.9 10.0 10.4  HGB 8.9* 9.1* 8.7* 8.8*  HCT 27.4* 28.2* 27.2* 27.0*  MCV 90.7 91.3  92.5 93.1  PLT 81* 105* 168 231    @IMGRELPRIORS @ Medications:    . amLODipine  10 mg Oral QHS  . aspirin  81 mg Oral Daily   Or  . aspirin  81 mg Per Tube Daily  . atorvastatin  40 mg Oral q1800  . azelastine  1 spray Each Nare BID  . Chlorhexidine Gluconate Cloth  6 each Topical Q0600  . darbepoetin (ARANESP) injection - DIALYSIS  100 mcg Intravenous Q Fri-HD  . famotidine  20 mg Oral QHS  . fluticasone  2 spray Each Nare BID  . isosorbide mononitrate  60 mg Oral Daily  . loratadine  10 mg Oral Q48H  . losartan  100 mg Oral Daily  . metoprolol succinate  200 mg Oral Daily  . multivitamin  1 tablet Oral Daily  . polyethylene glycol  17 g Oral Daily  . senna-docusate  1 tablet Oral BID  . ticagrelor  90 mg Oral BID     [facility] 03/28/2018, 9:56 AM

## 2018-03-28 NOTE — TOC Transition Note (Signed)
Transition of Care [facility]) - CM/SW Discharge Note   Patient Details  Name: Peter [name] MRN: [id number] Date of Birth: Jan 31, 1945  [facility] CM/SW Contact:  Carles Collet, RN Phone Number: 03/28/2018, 11:02 AM   Clinical Narrative:     Patient to DC to home today w [facility] services. Per previous notes patient chose [facility]. Notified HH that patient will DC today. Reviewed Brilinta price w patient. Will provide w coupon prior to DC. No other CM needs  Expected Discharge Plan and Services Barriers to Discharge: No Barriers Identified Expected Discharge Plan: [place] Discharge Planning Services: CM Consult Post Acute Care Choice: Home Health   Expected Discharge Date: 03/28/18               DME Arranged: N/A DME Agency: NA HH Arranged: PT, OT, RN, Disease Management, Nurse's Aide [place] Agency: [facility]  Final next level of care: [place] Barriers to Discharge: No Barriers Identified   Patient Goals and CMS Choice Patient states their goals for this hospitalization and ongoing recovery are:: "to be as independent as I was before" CMS Medicare.gov Compare Post Acute Care list provided to:: Patient(Daughter made choice) Choice offered to / list presented to : NA  Discharge Placement                       Discharge Plan and Services Discharge Planning Services: CM Consult Post Acute Care Choice: Home Health          DME Arranged: N/A DME Agency: NA HH Arranged: PT, OT, RN, Disease Management, Nurse's Aide [place] Agency: [facility]   Social Determinants of Health (SDOH) Interventions     Readmission Risk Interventions No flowsheet data found.

## 2018-03-28 NOTE — Plan of Care (Signed)
Progressing towards goals

## 2018-03-28 NOTE — Progress Notes (Addendum)
Occupational Therapy Treatment Patient Details Name: Peter [name] MRN: [id number] DOB: September 03, 1945 Today's Date: 03/28/2018    History of present illness Pt is a 73 y.o. male admitted 02/27/18 with L-sided weakness while at dialysis. MRI shows small volume diffusion abnormality involving parasagittal cerebral hemispheres, additional L frontal cortical involvement compatible with acute ischemia; remote bilat cerebellar infarcts. Pt with 2x NSTEMIs since admission. S/p PCI of LAD on 2/21. 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 s/p left ICA stent placement 03/20/18. PMH includes HTN, ESRD on HD.   OT comments  Pt progressing toward established goals. Pt currently requires minguard for ADL and functional mobility. Pt able to return demonstrate UE exercises, fine motor exercises and vision exercises with provided handout. Pt demonstrated visual deficits as listed below (see Vision), impacting his reading and focus on objects. Pt will continue to benefit from skilled OT services to maximize safety and independence with ADL and functional mobility. Pt current level of functioning adequate for d/c when medically appropriate. OT to sign off.   Follow Up Recommendations  Home health OT;Supervision - Intermittent;Outpatient OT    Equipment Recommendations  None recommended by OT    Recommendations for Other Services Rehab consult    Precautions / Restrictions Precautions Precautions: Fall Restrictions Weight Bearing Restrictions: No       Mobility Bed Mobility               General bed mobility comments: Sitting in chair upon OT arrival.   Transfers Overall transfer level: Needs assistance Equipment used: None Transfers: Sit to/from Stand Sit to Stand: Supervision Stand pivot transfers: Supervision            Balance Overall balance assessment: Needs assistance Sitting-balance  support: No upper extremity supported;Feet supported Sitting balance-Leahy Scale: Good     Standing balance support: During functional activity Standing balance-Leahy Scale: Fair Standing balance comment: Does better with UE support for dynamic tasks                           ADL either performed or assessed with clinical judgement   ADL Overall ADL's : Needs assistance/impaired     Grooming: Supervision/safety;Standing;Applying deodorant Grooming Details (indicate cue type and reason): pt required increased effort to open containers                  Toilet Transfer: Min guard;RW           Functional mobility during ADLs: Min guard General ADL Comments: pt stood for 25min to complete fine motor exercises and ADL     Vision   Vision Assessment?: Yes Eye Alignment: Within Functional Limits Ocular Range of Motion: Restricted on the left;Restricted looking up Alignment/Gaze Preference: Head turned(tendency for head turn to right) Tracking/Visual Pursuits: Decreased smoothness of horizontal tracking;Decreased smoothness of eye movement to LEFT superior field;Requires cues, head turns, or add eye shifts to track;Unable to hold eye position out of midline Saccades: Additional eye shifts occurred during testing;Additional head turns occurred during testing;Decreased speed of saccadic movement Diplopia Assessment: Only with left gaze Depth Perception: Undershoots Additional Comments: Pt demonstrating decreased smooth tracking and difficult maintaining attention to complete tracking tasks requiring increased cues. Pt also presenting with poor vertical tracking.   Perception     Praxis      Cognition Arousal/Alertness: Awake/alert Behavior During Therapy: WFL for tasks assessed/performed Overall Cognitive Status: Within Functional Limits  for tasks assessed Area of Impairment: Problem solving;Memory;Following commands;Attention                   Current  Attention Level: Alternating Memory: Decreased short-term memory Following Commands: Follows multi-step commands with increased time     Problem Solving: Slow processing;Requires verbal cues          Exercises     Shoulder Instructions       General Comments pt able to return demonstrate understanding of vision exercises, fine motor exercises, and UE exercises;general UE exercises to address risk of frozen shoulder    Pertinent Vitals/ Pain       Pain Assessment: Faces Faces Pain Scale: Hurts a little bit Pain Location: headache following vision assessment Pain Descriptors / Indicators: Headache Pain Intervention(s): Monitored during session  Home Living                                          Prior Functioning/Environment              Frequency  Min 2X/week        Progress Toward Goals  OT Goals(current goals can now be found in the care plan section)  Progress towards OT goals: Progressing toward goals(pt's current level of functioning adequate for d/c)  Acute Rehab OT Goals Patient Stated Goal: to get back to baseline OT Goal Formulation: With patient Time For Goal Achievement: 04/09/18 Potential to Achieve Goals: Good ADL Goals Pt Will Perform Grooming: with supervision;standing Pt Will Perform Upper Body Dressing: with supervision;sitting Pt Will Perform Lower Body Dressing: with supervision;sit to/from stand Pt Will Transfer to Toilet: with supervision;regular height toilet;ambulating Pt Will Perform Toileting - Clothing Manipulation and hygiene: with supervision;sit to/from stand Additional ADL Goal #1: Pt will locate 4/5 ADL items in left visual field with Min cues Additional ADL Goal #2: Pt will incorporate LUE into ADLs 75% of time with 2-3 cues  Plan Discharge plan remains appropriate    Co-evaluation                 AM-PAC OT "6 Clicks" Daily Activity     Outcome Measure   Help from another person eating meals?:  A Little Help from another person taking care of personal grooming?: A Little Help from another person toileting, which includes using toliet, bedpan, or urinal?: A Little Help from another person bathing (including washing, rinsing, drying)?: A Little Help from another person to put on and taking off regular upper body clothing?: A Little Help from another person to put on and taking off regular lower body clothing?: A Little 6 Click Score: 18    End of Session    OT Visit Diagnosis: Unsteadiness on feet (R26.81);Muscle weakness (generalized) (M62.81) Hemiplegia - Right/Left: Left Hemiplegia - dominant/non-dominant: Non-Dominant Hemiplegia - caused by: Cerebral infarction Pain - Right/Left: Left Pain - part of body: Shoulder(HA)   Activity Tolerance Patient tolerated treatment well   Patient Left in chair;with call bell/phone within reach   Nurse Communication Mobility status        Time: 1116-1207 OT Time Calculation (min): 51 min  Charges: OT General Charges $OT Visit: 1 Visit OT Treatments $Self Care/Home Management : 8-22 mins $Therapeutic Activity: 8-22 mins $Cognitive Funtion inital: Initial 15 mins  Dorinda Hill OTR/L Acute Rehabilitation Services Office: [place] 03/28/2018, 12:43 PM

## 2018-03-28 NOTE — Discharge Summary (Addendum)
Physician Discharge Summary  Peter [name] OZD:[id number] DOB: November 21, 1945 DOA: 02/27/2018  PCP: Patient, No Pcp Per  Admit date: 02/27/2018 Discharge date: 03/29/2018  Admitted From: Home Disposition:  Home  Recommendations for Outpatient Follow-up:  1. Follow up with PCP in 1 week 2. Can be considered for outpatient 30-day event recorder, can be arranged through patient's primary cardiology Dr. Beatrix Fetters. Follow up in 2 weeks. 3. Follow-up with Dr. Estanislado Pandy (IR) in 2 weeks  4. Follow up with Dr. Leonie Man (Neurology) in 4 weeks  5. Recommend outpatient vascular surgery follow up for incidental finding of severe calcification, possible occlusion of bilateral femoral artery, popliteal artery 6. Cardiac rehab referral placed  7. HD MWF  8. Incidental finding from abdominal ultrasound on 02/28/2018: Small mass in the lower pole of the right kidney, could reflect complex cyst or solid lesion. Noncontrast MRI best available imaging option. Will need to follow-up as an outpatient 9. Would recommend outpatient GI follow-up for colonoscopy  Home Health: PT OT    Discharge Condition: Stable CODE STATUS: Full  Diet recommendation: Renal   Brief/Interim Summary: Peter [name] is a 73 y.o.malewitha past medical history of ESRD on HD, hypertensionbeing brought to the hospital from his dialysis center due to strokelike symptoms with left sided weakness. Last known normal 3:15 this afternoon. Patient states after finishing dialysis today he felt weak and could not get out of the chair. Both of his legs felt weak and his right leg was shaking. He could not lift his arms. States he was told that his left arm was not moving. Denies any history of prior stroke. Work up revealed acute stroke, neurology consulted. Also noted to have an NSTEMI on 03/04/2018 with chest pain. Status post DES. S/p Left ICA stent placement.  Due to increase in oxygen demand, patient underwent CTA chest which was negative for PE.   DVT ultrasound revealed severe calcification, possible occlusion of bilateral femoral artery, popliteal artery. Discussed with vascular surgery, appropriate for outpatient follow up.   Patient continued to undergo hemodialysis during hospitalization.  His functional level continued to improve, PT OT recommended home health services.  Discharge Diagnoses:  Principal Problem:   Cerebrovascular accident (CVA) ([place]) Active Problems:   Elevated troponin   Elevated LFTs   HTN (hypertension)   ESRD on dialysis ([place])   CAD (coronary artery disease)   Diabetes ([facility])   CHF (congestive heart failure) ([place])   Traumatic brain injury ([place])   Anemia of chronic disease   Right kidney mass   TIA (transient ischemic attack)   Chronic systolic CHF (congestive heart failure) (H[facility])   NSTEMI (non-ST elevated myocardial infarction) ([place])   Carotid stenosis   Status post coronary artery stent placement   FTT (failure to thrive) in adult   Carotid stenosis, asymptomatic, left  Acute CVA -Embolic, unknown source -Patient presented with left-sided weakness after dialysis. He did not receive TPA due to mild deficits -Neurology consulted and appreciated -Cerebral arteriogram showed 90% stenosis of the proximal left ICA. S/p stent assisted angioplasty of high grade stenosis of prox Lt ICA with distal protection by IR  -Continue aspirin, brilinta, statin -Recommend outpatient 30-day event recorder, he will follow-up with his primary cardiology -Follow-up with neurology as an outpatient in 4 weeks  Left ICA stenosis -S/p cerebral angiogram- stent assisted angioplasty of high grade stenosis of prox L ICA with distal protection 3/6 -Dr. Estanislado Pandy recommending discontinuing Plavix, and starting Brilinta and has been following P2Y12  -Follow-up with Dr. Estanislado Pandy in 2  weeks   Acute thrombocytopenia  -Platelets dropped to 46; improving  -Possibly secondary Aggrastat -Unlikely HIT given that patient has had  heparin exposure with dialysis -DIC panel obtained: DDimer 2.22 fibrinogen 541, INR WNL no schistocytes on smear -Resolved   Acute hypoxemic respiratory failure  -DDimer was elevated 2.22 -Patient has been placed on 3L of O2 for supposed O2 saturation of 86% this morning (however, not documented) -CTA chest negative for PE -Resolved. On room air now   NSTEMI -Patient did have chest pain the night of 03/03/2018 03/04/2018, with a peak troponin of 6.4 -Cardiology consulted and appreciated -Status post cardiac catheterization on 03/06/2018 with PCI/DES x1 to the LAD with scoring balloon to ISR in the ostium of OM1. Continue to treat RCA disease medically. -Patient will need DAPT with aspirin and Plavix for at least 1 year once done with Brilinta as per interventional radiology recommendation -Cardiology recommended 30-day event monitor with TEE/possible ICD evaluation on outpatient basis through his primary cardiologist, Dr. Beatrix Fetters -Of note, TEE was attempted but aborted on 03/03/2018, as patient was unable to be sedated adequately -No further complaints of chest pain -Follow-up with cardiology in 2 weeks  Chronic systolic congestive heart failure -Echocardiogram on 02/28/2018 showed an EF of 30 to 35%, cavity size mildly dilated. Diffuse hypokinesis. Impaired relaxation of left ventricle -Continue volume management with dialysis -Follow-up with cardiology as an outpatient in 2 weeks  ESRD -Patient dialyzes Monday Wednesday Friday -Nephrology following  Anemia of chronic disease -FOBT was positive however does not appear to be actively bleeding. -Previous hospitalist discussed with gastroenterologist, Dr. Silverio Decamp on 03/13/2018, given his recent MI and stent placement, heme positive, would recommend outpatient follow-up for colonoscopy -Received 2u PRBC in the past few days -Hemoglobin remained stable  Right renal mass -Incidental finding from abdominal ultrasound on 02/28/2018:  Small mass in the lower pole of the right kidney, could reflect complex cyst or solid lesion. Noncontrast MRI best available imaging option. -Will need to follow-up as an outpatient  Essential hypertension -Stable, continue amlodipine, losartan, metoprolol  HLD -Continue lipitor   Failure to thrive/physical deconditioning  -Likely due to prolonged hospitalization -Patient has progressed with physical therapy  Peripheral artery disease -Incidental finding of severe calcification, possible occlusion of bilateral femoral artery, popliteal artery.  Discussed finding with vascular surgery on-call 3/11, patient may follow-up with them as an outpatient.  He does not have any open wounds of his bilateral lower extremities    Discharge Instructions  Discharge Instructions    Amb Referral to Cardiac Rehabilitation   Complete by:  As directed    Referring to [place] [facility] 2   Diagnosis:   NSTEMI Coronary Stents     Ambulatory referral to Neurology   Complete by:  As directed    Follow up with Dr. Leonie Man in 4 weeks. If Dr. Leonie Man not available, Dr. Jaynee Eagles or Dr. Leta Baptist is OK. To complicated for NP to follow. Thanks.   Call MD for:  difficulty breathing, headache or visual disturbances   Complete by:  As directed    Call MD for:  extreme fatigue   Complete by:  As directed    Call MD for:  persistant dizziness or light-headedness   Complete by:  As directed    Discharge instructions   Complete by:  As directed    You were cared for by a hospitalist during your hospital stay. If you have any questions about your discharge medications or the care you received while you  were in the hospital after you are discharged, you can call the unit and ask to speak with the hospitalist on call if the hospitalist that took care of you is not available. Once you are discharged, your primary care physician will handle any further medical issues. Please note that NO REFILLS for any discharge  medications will be authorized once you are discharged, as it is imperative that you return to your primary care physician (or establish a relationship with a primary care physician if you do not have one) for your aftercare needs so that they can reassess your need for medications and monitor your lab values.   Increase activity slowly   Complete by:  As directed      Allergies as of 03/28/2018      Reactions   Metformin And Related Other (See Comments)   Contraindicated due to renal failure.  Renal failure-on peritoneal dialysis   Pioglitazone Other (See Comments)   History of congestive heart failure.    Propoxyphene Rash      Medication List    STOP taking these medications   aspirin EC 325 MG tablet Replaced by:  aspirin 81 MG chewable tablet   furosemide 40 MG tablet Commonly known as:  LASIX     TAKE these medications   acetaminophen 500 MG tablet Commonly known as:  TYLENOL Take 1,500 mg by mouth daily as needed (pain).   amLODipine 10 MG tablet Commonly known as:  NORVASC Take 1 tablet (10 mg total) by mouth at bedtime. What changed:    medication strength  how much to take   Artificial Tears 1.4 % ophthalmic solution Generic drug:  polyvinyl alcohol Place 1 drop into both eyes daily as needed for dry eyes.   aspirin 81 MG chewable tablet Chew 1 tablet (81 mg total) by mouth daily. Start taking on:  March 29, 2018 Replaces:  aspirin EC 325 MG tablet   atorvastatin 40 MG tablet Commonly known as:  LIPITOR Take 40 mg by mouth daily at 6 PM.   famotidine 20 MG tablet Commonly known as:  PEPCID Take 20 mg by mouth at bedtime.   isosorbide mononitrate 60 MG 24 hr tablet Commonly known as:  IMDUR Take 1 tablet (60 mg total) by mouth daily.   losartan 100 MG tablet Commonly known as:  COZAAR Take 1 tablet (100 mg total) by mouth daily.   metoprolol 200 MG 24 hr tablet Commonly known as:  TOPROL-XL Take 1 tablet (200 mg total) by mouth daily.    multivitamin Tabs tablet Take 1 tablet by mouth daily.   nitroGLYCERIN 0.4 MG SL tablet Commonly known as:  NITROSTAT Place 0.4 mg under the tongue every 5 (five) minutes as needed for chest pain.   ticagrelor 90 MG Tabs tablet Commonly known as:  BRILINTA Take 1 tablet (90 mg total) by mouth 2 (two) times daily.   TUMS PO Take 1-3 mg by mouth See admin instructions. Take 3 tablets by mouth up to three times daily with full meals, take 1 tablet with snacks/small meals      Follow-up Information    McGukin, Shade Flood., MD Follow up in 2 week(s).   Specialty:  Cardiology Why:  to discuss outpatient heart event monitor , to discuss ICD placement in light of ischemic cardiomyopathy  Contact information: [address] [place] [address] [phone]        [name], Lyman Bishop, MD Follow up.   Why:  follow  up on right renal mass, nephrology to consider outpatient noncontrast renal MRI.  Contact information: [address] [place] [address] [phone]        Garvin Fila, MD Follow up in 4 week(s).   Specialties:  Neurology, Radiology Why:  stroke follow up Contact information: [place] [address] [place], [place] Follow up.   Specialty:  Home Health Services Why:  Registered Nurse, Physical Therapy, Aide Contact information: [place] [address] [phone]        Luanne Bras, MD Follow up in 2 week(s).   Specialties:  Interventional Radiology, Radiology Why:  Please plan to follow-up with Dr. Estanislado Pandy in clinic 2 weeks after discharge. Contact information: [place] [address] [phone]        Serafina Mitchell, MD. Schedule an appointment as soon as possible for a visit in 1 week(s).   Specialties:  Vascular Surgery, Cardiology Why:  Follow up for DVT ultrasound which revealed severe  calcification, possible occlusion of bilateral femoral artery, popliteal artery Contact information: [address] [place] [address] [phone]          Allergies  Allergen Reactions  . Metformin And Related Other (See Comments)    Contraindicated due to renal failure.  Renal failure-on peritoneal dialysis    . Pioglitazone Other (See Comments)    History of congestive heart failure.    . Propoxyphene Rash    Consultations:  Neurology  PMR  Nephrology  Cardiology  EP  IR   Procedures/Studies: Ct Angio Head W Or Wo Contrast  Result Date: 02/27/2018 CLINICAL DATA:  Sudden onset left-sided weakness and altered mental status following dialysis. EXAM: CT ANGIOGRAPHY HEAD AND NECK TECHNIQUE: Multidetector CT imaging of the head and neck was performed using the standard protocol during bolus administration of intravenous contrast. Multiplanar CT image reconstructions and MIPs were obtained to evaluate the vascular anatomy. Carotid stenosis measurements (when applicable) are obtained utilizing NASCET criteria, using the distal internal carotid diameter as the denominator. CONTRAST:  147m OMNIPAQUE IOHEXOL 300 MG/ML  SOLN COMPARISON:  Head CT 02/27/2018 FINDINGS: CTA NECK FINDINGS SKELETON: There is no bony spinal canal stenosis. No lytic or blastic lesion. OTHER NECK: Normal pharynx, larynx and major salivary glands. No cervical lymphadenopathy. Unremarkable thyroid gland. UPPER CHEST: Mild biapical emphysema AORTIC ARCH: There is moderate calcific atherosclerosis of the aortic arch. There is no aneurysm, dissection or hemodynamically significant stenosis of the visualized ascending aorta and aortic arch. Normal variant aortic arch branching pattern with the left vertebral artery arising independently from the aortic arch. The visualized proximal subclavian arteries are widely patent. RIGHT CAROTID SYSTEM: --Common carotid artery: Widely patent origin without common carotid  artery dissection or aneurysm. --Internal carotid artery: No dissection, occlusion or aneurysm. Mild atherosclerotic calcification at the carotid bifurcation without hemodynamically significant stenosis. --External carotid artery: No acute abnormality. LEFT CAROTID SYSTEM: --Common carotid artery: Widely patent origin without common carotid artery dissection or aneurysm. --Internal carotid artery: No dissection, occlusion or aneurysm. There is predominantly noncalcified atherosclerosis extending into the proximal ICA, resulting in 50% stenosis. Small area of likely ulcerative plaque within the distal left ICA. --External carotid artery: No acute abnormality. VERTEBRAL ARTERIES: Codominant configuration. At least moderate stenosis of the right vertebral artery origin. The remainder of the right vertebral artery is normal. Normal left vertebral artery. CTA HEAD FINDINGS  POSTERIOR CIRCULATION: --Basilar artery: Normal. --Posterior cerebral arteries: Both posterior cerebral arteries are partially supplied by the posterior communicating arteries. --Superior cerebellar arteries: Normal. --Inferior cerebellar arteries: Normal anterior and posterior inferior cerebellar arteries. ANTERIOR CIRCULATION: --Intracranial internal carotid arteries: Atherosclerotic calcification of the internal carotid arteries at the skull base without hemodynamically significant stenosis. --Anterior cerebral arteries: Normal. Both A1 segments are present. Patent anterior communicating artery. --Middle cerebral arteries: Normal. --Posterior communicating arteries: Present bilaterally. VENOUS SINUSES: As permitted by contrast timing, patent. ANATOMIC VARIANTS: None DELAYED PHASE: No parenchymal contrast enhancement. Review of the MIP images confirms the above findings. IMPRESSION: 1. No emergent large vessel occlusion or high-grade intracranial stenosis. 2. Approximately 50% stenosis of the proximal left internal carotid artery. 3. At least  moderate stenosis of the right vertebral artery origin. Aortic Atherosclerosis (ICD10-I70.0) and Emphysema (ICD10-J43.9). Electronically Signed   By: Ulyses Jarred M.D.   On: 02/27/2018 18:04   Dg Chest 1 View  Result Date: 03/16/2018 CLINICAL DATA:  Shortness of breath. EXAM: CHEST  1 VIEW COMPARISON:  None. FINDINGS: Heart is enlarged. Aortic atherosclerosis is present. The lungs are hyperinflated. No significant edema present. There are no significant effusions. A right IJ dialysis catheter is in place. The visualized soft tissues and bony thorax are unremarkable. IMPRESSION: 1. Cardiomegaly without failure. 2. Aortic atherosclerosis. 3. Hyperinflation of both lungs, suggesting COPD. Electronically Signed   By: San Morelle M.D.   On: 03/16/2018 18:59   Ct Head Wo Contrast  Result Date: 03/02/2018 CLINICAL DATA:  73 year old male with New left upper extremity weakness. Scattered tiny watershed infarcts suspected in both hemispheres on recent brain MRI. EXAM: CT HEAD WITHOUT CONTRAST TECHNIQUE: Contiguous axial images were obtained from the base of the skull through the vertex without intravenous contrast. COMPARISON:  Brain MRI and head CT 02/27/2018 FINDINGS: Brain: Loss of sulci and gray-white matter differentiation in the right superior frontal lobe most apparent on coronal image 40. No regional mass effect. Elsewhere in the right MCA territory gray-white matter differentiation appears stable and preserved. No definite acute intracranial hemorrhage. In the left hemisphere and posterior fossa gray-white matter differentiation appears stable. Ventricle size remains normal. Vascular: Calcified atherosclerosis at the skull base. Questionable new hyperdensity of the right M1 is new compared to 02/27/2018 on series 3, image 12. However, there seems to be generalized increased vascular density when compared to the prior CT. Skull: Stable.  Chronic right lamina papyracea fracture. Sinuses/Orbits:  Visualized paranasal sinuses and mastoids are stable and well pneumatized. Other: No acute orbit or scalp soft tissue findings. IMPRESSION: 1. Loss of right superior frontal lobe sulci and gray-white matter, most suspicious for cytotoxic edema such as due to new infarct. No mass effect. No definite acute hemorrhage. 2. Seemingly generalized increased density of vascular structures compared to the earlier CT. No recent IV contrast administration is known. Perhaps this might reflect dehydration. Study discussed by telephone with Dr. Amie Portland on 03/02/2018 at 03:53 . Electronically Signed   By: Genevie Ann M.D.   On: 03/02/2018 03:54   Ct Angio Neck W Or Wo Contrast  Result Date: 02/27/2018 CLINICAL DATA:  Sudden onset left-sided weakness and altered mental status following dialysis. EXAM: CT ANGIOGRAPHY HEAD AND NECK TECHNIQUE: Multidetector CT imaging of the head and neck was performed using the standard protocol during bolus administration of intravenous contrast. Multiplanar CT image reconstructions and MIPs were obtained to evaluate the vascular anatomy. Carotid stenosis measurements (when applicable) are obtained utilizing NASCET criteria, using the distal  internal carotid diameter as the denominator. CONTRAST:  154m OMNIPAQUE IOHEXOL 300 MG/ML  SOLN COMPARISON:  Head CT 02/27/2018 FINDINGS: CTA NECK FINDINGS SKELETON: There is no bony spinal canal stenosis. No lytic or blastic lesion. OTHER NECK: Normal pharynx, larynx and major salivary glands. No cervical lymphadenopathy. Unremarkable thyroid gland. UPPER CHEST: Mild biapical emphysema AORTIC ARCH: There is moderate calcific atherosclerosis of the aortic arch. There is no aneurysm, dissection or hemodynamically significant stenosis of the visualized ascending aorta and aortic arch. Normal variant aortic arch branching pattern with the left vertebral artery arising independently from the aortic arch. The visualized proximal subclavian arteries are widely  patent. RIGHT CAROTID SYSTEM: --Common carotid artery: Widely patent origin without common carotid artery dissection or aneurysm. --Internal carotid artery: No dissection, occlusion or aneurysm. Mild atherosclerotic calcification at the carotid bifurcation without hemodynamically significant stenosis. --External carotid artery: No acute abnormality. LEFT CAROTID SYSTEM: --Common carotid artery: Widely patent origin without common carotid artery dissection or aneurysm. --Internal carotid artery: No dissection, occlusion or aneurysm. There is predominantly noncalcified atherosclerosis extending into the proximal ICA, resulting in 50% stenosis. Small area of likely ulcerative plaque within the distal left ICA. --External carotid artery: No acute abnormality. VERTEBRAL ARTERIES: Codominant configuration. At least moderate stenosis of the right vertebral artery origin. The remainder of the right vertebral artery is normal. Normal left vertebral artery. CTA HEAD FINDINGS POSTERIOR CIRCULATION: --Basilar artery: Normal. --Posterior cerebral arteries: Both posterior cerebral arteries are partially supplied by the posterior communicating arteries. --Superior cerebellar arteries: Normal. --Inferior cerebellar arteries: Normal anterior and posterior inferior cerebellar arteries. ANTERIOR CIRCULATION: --Intracranial internal carotid arteries: Atherosclerotic calcification of the internal carotid arteries at the skull base without hemodynamically significant stenosis. --Anterior cerebral arteries: Normal. Both A1 segments are present. Patent anterior communicating artery. --Middle cerebral arteries: Normal. --Posterior communicating arteries: Present bilaterally. VENOUS SINUSES: As permitted by contrast timing, patent. ANATOMIC VARIANTS: None DELAYED PHASE: No parenchymal contrast enhancement. Review of the MIP images confirms the above findings. IMPRESSION: 1. No emergent large vessel occlusion or high-grade intracranial  stenosis. 2. Approximately 50% stenosis of the proximal left internal carotid artery. 3. At least moderate stenosis of the right vertebral artery origin. Aortic Atherosclerosis (ICD10-I70.0) and Emphysema (ICD10-J43.9). Electronically Signed   By: KUlyses JarredM.D.   On: 02/27/2018 18:04   Ct Angio Chest Pe W Or Wo Contrast  Result Date: 03/24/2018 CLINICAL DATA:  Pt admitted in February as a Code stroke. Pt still hasn't been discharged; Pt c/o SOB, with elevated D-Dimer; Hx of ESRD, denies prior PE, DVT EXAM: CT ANGIOGRAPHY CHEST WITH CONTRAST TECHNIQUE: Multidetector CT imaging of the chest was performed using the standard protocol during bolus administration of intravenous contrast. Multiplanar CT image reconstructions and MIPs were obtained to evaluate the vascular anatomy. CONTRAST:  822mOMNIPAQUE IOHEXOL 350 MG/ML SOLN COMPARISON:  11/30/2017 FINDINGS: Cardiovascular: Four-chamber cardiac enlargement. No pericardial effusion. Dilated central pulmonary arteries. Satisfactory opacification of pulmonary arteries noted, and there is no evidence of pulmonary emboli. Extensive coronary calcifications. Fair contrast opacification of thoracic aorta. No aneurysm. No suggestion of dissection or stenosis although evaluation is limited. Moderate calcified atheromatous plaque throughout. The visualized proximal abdominal aorta is atheromatous, otherwise unremarkable. Mediastinum/Nodes: No hilar or mediastinal adenopathy. Lungs/Pleura: Trace bilateral pleural effusions. The loculated fluid previously seen in the left major fissure has nearly completely resolved. No pneumothorax. Emphysema with multiple subpleural blebs. Linear scarring or subsegmental atelectasis and mild adjacent ground-glass opacities in the dependent aspect of both lower lobes. Upper Abdomen: Parenchymal  calcifications in the visualized portions of both kidneys, incompletely characterized. No suggestion of hydronephrosis. No acute findings.  Musculoskeletal: Healing sternal fracture, nondisplaced. Healing right fourth and fifth rib fractures, and left fourth rib fracture. Review of the MIP images confirms the above findings. IMPRESSION: 1. Negative for acute PE. 2. Four-chamber cardiac enlargement with dilated central pulmonary arteries suggesting pulmonary arterial hypertension. 3. Coronary and Aortic Atherosclerosis (ICD10-I70.0) and Emphysema (ICD10-J43.9). 4. Trace bilateral pleural effusions. 5. Healing sternal and bilateral rib fractures. Electronically Signed   By: Lucrezia Europe M.D.   On: 03/24/2018 12:24   [name]  Result Date: 03/02/2018 CLINICAL DATA:  Acute onset left arm weakness. Right frontal edema by CT. EXAM: MRI HEAD WITHOUT CONTRAST TECHNIQUE: Multiplanar, multiecho pulse sequences of the brain and surrounding structures were obtained without intravenous contrast. COMPARISON:  CT earlier same day.  MRI 02/27/2018. FINDINGS: Brain: No appreciable change since the study of 3 days ago. Acute/subacute infarctions affecting the cortical brain at the vertices, right more than left, most consistent with watershed infarctions. These do not appear progressive. There is only mild/minimal brain swelling in the right frontal region. No evidence of mass, hemorrhage, hydrocephalus or extra-axial collection. Vascular: Major vessels at the base of the brain show flow. Skull and upper cervical spine: Negative Sinuses/Orbits: Clear/normal Other: None IMPRESSION: No change since the study of 3 days ago. 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 No new area of involvement. No significant increase in swelling. No sign of hemorrhage. I should note that in review the CT angiogram of 02/27/2018, I think the stenosis at the distal bulb on the left is 70% or greater. I think there is very severe stenosis in the left carotid siphon, likely flow limiting. Moderate stenosis in the  right carotid siphon. Electronically Signed   By: Nelson Chimes M.D.   On: 03/02/2018 10:11   [name]  Result Date: 02/28/2018 CLINICAL DATA:  Initial evaluation for acute left-sided weakness. EXAM: MRI HEAD WITHOUT CONTRAST TECHNIQUE: Multiplanar, multiecho pulse sequences of the brain and surrounding structures were obtained without intravenous contrast. COMPARISON:  Prior CT and CTA from earlier same day. FINDINGS: Brain: Generalized age-related cerebral atrophy. Mild T2/FLAIR hyperintensity within the periventricular and deep white matter both cerebral hemispheres, nonspecific, but most like related chronic microvascular ischemic disease, minimal for age. Few scattered small remote bilateral cerebellar infarcts noted. Subtle scattered diffusion abnormality seen involving the parasagittal cortical gray matter of the high right frontoparietal region (series 5, images 87, 84, 83). Additional subtle diffusion abnormality seen involving the parasagittal left frontal parietal region, left cingulate (series 5, image 81). Small focus of diffusion abnormality at the anterior left frontal lobe (series 5, image 80). Finding compatible with small acute ischemic infarcts, ACA and/or ACA/MCA distributions. No associated hemorrhage or mass effect. Gray-white matter differentiation otherwise maintained. No other evidence for remote cortical infarction. No foci of susceptibility artifact to suggest acute or chronic intracranial hemorrhage. No mass lesion, midline shift or mass effect. No hydrocephalus. No extra-axial fluid collection. Pituitary gland within normal limits. Vascular: Major intracranial vascular flow voids maintained at the skull base. Diminutive vertebrobasilar system noted. Skull and upper cervical spine: Craniocervical junction normal. Upper cervical spine within normal limits. Bone marrow signal intensity normal. No scalp soft tissue abnormality. Sinuses/Orbits: Globes and orbital soft tissues  within normal limits. Mild scattered mucosal thickening within the ethmoidal air cells. Paranasal sinuses are otherwise largely clear. Trace right mastoid  effusion, of doubtful significance. Inner ear structures grossly normal. Other: None. IMPRESSION: 1. Scattered small volume diffusion abnormality involving the parasagittal cerebral hemispheres bilaterally near the vertex, right greater than left, with additional mild left frontal cortical involvement as above, compatible with acute ischemia. Changes are largely ACA distribution, with some MCA and/or ACA-MCA watershed distribution. 2. Few small remote bilateral cerebellar infarcts. 3. Underlying age-related cerebral atrophy with mild chronic microvascular ischemic disease. Electronically Signed   By: Jeannine Boga M.D.   On: 02/28/2018 01:02   Ir Fluoro Guide Cv Line Right  Result Date: 03/27/2018 INDICATION: 73 year old male with end-stage renal disease on hemodialysis. He is currently dialyzing via a med comp split tip 23 cm tip to cuff tunneled hemodialysis catheter which was placed by Dr. Kathlene Cote on 12/08/2017. The arterial limb is functioning poorly. He presents for catheter exchange. EXAM: Tunneled hemodialysis catheter exchange MEDICATIONS: None ANESTHESIA/SEDATION: None FLUOROSCOPY TIME:  Fluoroscopy Time: 1 minutes 12 seconds (22 mGy). COMPLICATIONS: None immediate. PROCEDURE: Informed written consent was obtained from the patient after a thorough discussion of the procedural risks, benefits and alternatives. All questions were addressed. Maximal Sterile Barrier Technique was utilized including caps, mask, sterile gowns, sterile gloves, sterile drape, hand hygiene and skin antiseptic. A timeout was performed prior to the initiation of the procedure. The existing catheter was imaged. The catheter sits relatively low in the right atrium. The tunnel tract was anesthetized with 1% lidocaine. Using sharp dissection, the fabric cuff was freed. A  stiff Amplatz wire was advanced through the catheter and into the inferior vena cava. The catheter was removed over the wire. The decision was made to proceed with placement of a shorter length catheter to keep the tip free of the inferior cavoatrial junction in the bottom of the atrium. Therefore, a 19 cm palindrome tunneled hemodialysis catheter was advanced over the stiff Glidewire and position with the catheter tip in the mid right atrium. The catheter flushes and aspirates with ease. An image was obtained and stored for the medical record. The catheter was then flushed with heparinized saline. There was some bleeding from the skin tract. Therefore, the tract was packed with Gelfoam and hemostasis attained with manual pressure. The catheter was secured to the skin with 0 Prolene suture. Sterile bandages were applied. IMPRESSION: 1. Successful exchange for a new 19 cm tip to cuff palindrome tunneled hemodialysis catheter. The catheter tip is in the mid right atrium and is ready for immediate use. Electronically Signed   By: Jacqulynn Cadet M.D.   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 EXAM: INTRAVSC STENT CERV CAROTID W/ EMB-PROT COMPARISON:  Diagnostic arteriogram of 03/10/2018. MEDICATIONS: Heparin 3,500 units IV; Ancef 2 g IV antibiotic was administered within 1 hour of the procedure. ANESTHESIA/SEDATION: Mac anesthesia as per Department of Anesthesiology at [place]:  Isovue 300 approximately 75 mL. FLUOROSCOPY TIME:  Fluoroscopy Time: 26 minutes 36 seconds (712 mGy). COMPLICATIONS: None immediate. TECHNIQUE: Informed written consent was obtained from the patient after a thorough discussion of the procedural risks, benefits and alternatives. All questions were addressed. Maximal Sterile Barrier Technique was utilized including caps,  mask, sterile gowns, sterile gloves, sterile drape, hand hygiene and skin antiseptic. A timeout was performed prior to the initiation of the procedure. The right groin was prepped and draped in the usual sterile fashion. Thereafter using modified Seldinger  technique, transfemoral access into the right common femoral artery was obtained without difficulty. Over a 0.035 inch guidewire, a 5 French Pinnacle sheath was inserted. Through this, and also over 0.035 inch guidewire, a 5 [place] JB 1 catheter was advanced to the aortic arch region and selectively positioned in the left common carotid artery. FINDINGS: The left common femoral arteriogram again demonstrates the origin the left external carotid artery and its major branches to be widely patent. The left internal carotid artery just distal to the bulb again demonstrates severe high-grade stenosis of approximately 80 to 90%. Distal to this the vessel is seen to opacify to the cranial skull base. Mild stenosis is seen at the junction of the distal 1/3 and the middle 1/3 of the left internal carotid artery. The petrous segment is widely patent. There is an approximately 50% stenosis of the proximal cavernous segment of the left internal carotid artery. Distal to this the distal cavernous, and the supraclinoid segments are widely patent. The left middle cerebral artery and the left anterior cerebral artery opacify into the capillary and venous phases. The left posterior communicating artery is seen opacifying the left posterior cerebral artery distribution. ENDOVASCULAR REVASCULARIZATION OF THE SEVERELY STENOTIC LEFT INTERNAL CAROTID ARTERY PROXIMALLY WITH STENT ASSISTED ANGIOPLASTY WITH DISTAL PROTECTION. The diagnostic JB 1 catheter in left common carotid artery was exchanged over a 0.035 inch 300 cm Rosen exchange guidewire for an 80 cm 6 [place] Cook shuttle sheath using biplane roadmap technique and constant fluoroscopic guidance. Good aspiration obtained from hub  of the [facility] shuttle sheath. A gentle control arteriogram performed through the sheath demonstrated no evidence of spasms, dissections or of intraluminal filling defects. Distal circulation remain unchanged. At this time, a 4/7 mm diameter [employer] Emb[employer]ield distal protection device was purged of air in its housing. The filter was then captured into the delivery microcatheter. A J configuration was given to the distal end of the 014 inch micro guidewire. Thereafter, using biplane roadmap technique and constant fluoroscopic guidance, in a coaxial manner and with constant heparinized saline infusion, the combination of the filter carrier catheter, with the filter wire was advanced to the distal end of the [employer] sheath. The micro guidewire was then manipulated using a torque device and advanced without difficulty through the high-grade stenosis of the left internal carotid proximally. This was then followed by the delivery microcatheter. The combination was advanced until the distal end of the micro guidewire was in the horizontal petrous segment. At this time, the filter device was then deployed without event following release of the mechanism, and retrieving the delivery microcatheter under constant fluoroscopic guidance. The delivery microcatheter was retrieved and removed. A control arteriogram performed through the [facility] shuttle sheath in the left common carotid artery demonstrated safe position of the tip of the filter device without evidence of dissections or of vasospasm. Measurements were performed of the left internal carotid artery proximally in its normal segment, and also the left common carotid artery. It was decided to use a 6 mm-8 mm x 40 mm Xact stent. This was retrogradely prepped with heparinized saline infusion. Thereafter using the rapid exchange technique, the delivery catheter with the stent was then advanced to the left common carotid artery/internal carotid artery junction. The proximal and  the distal markers were then positioned. The stent was then deployed in the usual manner without difficulty. With the distal filter wire remaining stable, the delivery mechanism of the stent was then retrieved and removed. A control arteriogram performed through  the [facility] shuttle sheath on the left common carotid artery demonstrated significantly improved caliber and flow through the stented segment intracranially. However, there continued to be a significant waist. This prompted the advancement of a 5 mm x 30 mm Viatrac 014 angioplasty balloon catheter at the site of the waist within the stented segment. The balloon had been retrogradely prepped with heparinized saline infusion followed by advancement over the exchange microcatheter using the rapid exchange technique. The markers were then positioned adequate distance from the site of the waist. The balloon was then inflated using micro inflation syringe device via micro tubing to 10.3 atmospheres where it was maintained for approximately 20 seconds. Balloon was then deflated and retrieved and removed while the distal filter wire was capped and in stable position. A control arteriogram performed through the [facility] shuttle sheath demonstrated excellent flow through the stent and angioplasty segment of the left internal carotid artery. Intracranially, the left anterior and the left middle cerebral artery distribution remained stable without evidence of intraluminal filling defects or of occlusions. The filter capture catheter was then retrogradely prepped with heparinized saline infusion. With rapid exchange technique, this was then advanced to the proximal portion of the filter device. The wire was then retrieved into the capture filter catheter. The two were then removed under constant fluoroscopic guidance. No entanglement with the stent was noted. Control arteriograms were then performed at 15 and 30 minutes post the intra stent angioplasty. These continued to  demonstrate excellent apposition and flow through the stented segment and intracranially. Prior to the placement of the stent in the left internal carotid artery, the patient was given a quarter bolus dose of Aggrastat intravenously. This was 437.5 mcg mixed in normal saline to a 34 mL volume. The [facility] shuttle sheath was then retrieved into the abdominal aorta and exchanged over a J-tip guidewire for a 6 [place] Pinnacle sheath. This in turn was then removed with manual compression being applied for successful hemostasis. The right groin appeared soft without evidence of hematoma or bleeding. Distal pulses remained Dopplerable in the posterior tibial and the dorsalis pedis regions bilaterally. Neurologically, the patient was asymptomatic with no change in his vision, speech, or motor function in his upper and lower extremities. Patient was then transferred to the neuro ICU to continue on low-dose IV heparin, to monitor his blood pressure, and also to monitor neurologically. Overnight the patient had no significant issues. The following morning, the IV heparin was stopped. The patient at the time of visit was undergoing dialysis. He remained stable nonfocal. The patient's lab work revealed a count of 40 K. This was discussed with the admitting. It was felt this could be related to the patient's Aggrastat. In view of this, it was decided to repeat the patient's platelet count the following morning. The groin site and the distal pulses remained unchanged. The patient was then transferred back to the admittance service. It was decided to leave the patient on aspirin 81 mg a day, and Brilinta 45 mg twice a day. Patient was instructed to maintain adequate hydration and to continue taking his medications. He was advised to be aware of a stroke-like symptoms. Should those occur, call 911. PLAN: Follow-up in the clinic 4 weeks post treatment. Follow-up ultrasound of the carotids approximately 3-4 months post treatment.  Electronically Signed   By: Luanne Bras M.D.   On: 03/23/2018 13:18   Ct Head Code Stroke Wo Contrast  Result Date: 02/27/2018 CLINICAL DATA:  Code stroke.  73 y/o  M; left-sided weakness. EXAM: CT HEAD WITHOUT CONTRAST TECHNIQUE: Contiguous axial images were obtained from the base of the skull through the vertex without intravenous contrast. COMPARISON:  None. FINDINGS: Brain: No evidence of acute infarction, hemorrhage, hydrocephalus, extra-axial collection or mass lesion/mass effect. Nonspecific white matter hypodensities compatible with chronic microvascular ischemic changes volume loss of the brain. Vascular: Density within right M2 superior division may represent thrombus. Skull: Normal. Negative for fracture or focal lesion. Sinuses/Orbits: No acute finding. Other: None. ASPECTS [facility] Stroke Program Early CT Score) - Ganglionic level infarction (caudate, lentiform nuclei, internal capsule, insula, M1-M3 cortex): 7 - Supraganglionic infarction (M4-M6 cortex): 3 Total score (0-10 with 10 being normal): 10 IMPRESSION: 1. No acute intracranial abnormality identified. 2. Density within right M2 superior division may represent thrombus. 3. Mild chronic microvascular ischemic changes and volume loss of brain. 4. ASPECTS is 10 These results were called by telephone at the time of interpretation on 02/27/2018 at 4:48 pm to Dr. Lorraine Lax, who verbally acknowledged these results. Electronically Signed   By: Kristine Garbe M.D.   On: 02/27/2018 16:49   Vas [place] Lower Extremity Venous (dvt)  Result Date: 03/24/2018  Lower Venous Study Indications: SOB, and elevated D-dimer.  Limitations: Body habitus and shadowing from calcification of adjacent arteries. Comparison Study: No comparison study available Performing Technologist: Rudell Cobb  Examination Guidelines: A complete evaluation includes B-mode imaging, spectral Doppler, color Doppler, and power Doppler as needed of all accessible portions of  each vessel. Bilateral testing is considered an integral part of a complete examination. Limited examinations for reoccurring indications may be performed as noted.  Right Venous Findings: +---------+---------------+---------+-----------+----------+--------------+          CompressibilityPhasicitySpontaneityPropertiesSummary        +---------+---------------+---------+-----------+----------+--------------+ CFV      Full           Yes      Yes                                 +---------+---------------+---------+-----------+----------+--------------+ SFJ      Full                                                        +---------+---------------+---------+-----------+----------+--------------+ FV Prox  Full                                                        +---------+---------------+---------+-----------+----------+--------------+ FV Mid   Full                                                        +---------+---------------+---------+-----------+----------+--------------+ FV DistalFull                                                        +---------+---------------+---------+-----------+----------+--------------+ PFV  Not visualized +---------+---------------+---------+-----------+----------+--------------+ POP      Full           Yes      Yes                                 +---------+---------------+---------+-----------+----------+--------------+ PTV      Full                                                        +---------+---------------+---------+-----------+----------+--------------+ PERO     Full                                                        +---------+---------------+---------+-----------+----------+--------------+ Incidental finding: Severe calcification and possible occlusion of FA,POP A.  Left Venous Findings:  +---------+---------------+---------+-----------+----------+--------------+          CompressibilityPhasicitySpontaneityPropertiesSummary        +---------+---------------+---------+-----------+----------+--------------+ CFV      Full           Yes      Yes                                 +---------+---------------+---------+-----------+----------+--------------+ SFJ      Full                                                        +---------+---------------+---------+-----------+----------+--------------+ FV Prox  Full                                                        +---------+---------------+---------+-----------+----------+--------------+ FV Mid   Full                                                        +---------+---------------+---------+-----------+----------+--------------+ FV DistalFull                                                        +---------+---------------+---------+-----------+----------+--------------+ PFV                                                   Not visualized +---------+---------------+---------+-----------+----------+--------------+ POP      Full           Yes      Yes                                 +---------+---------------+---------+-----------+----------+--------------+  PTV      Full                                                        +---------+---------------+---------+-----------+----------+--------------+ PERO     Full                                                        +---------+---------------+---------+-----------+----------+--------------+ Incidental finding: Severe calcification and possible occlusion of FA,POP A.    Summary: Right: There is no evidence of deep vein thrombosis in the lower extremity. However, portions of this examination were limited- see technologist comments above. No cystic structure found in the popliteal fossa. Left: There is no evidence of deep vein thrombosis in  the lower extremity. However, portions of this examination were limited- see technologist comments above. No cystic structure found in the popliteal fossa. Incidental findings: Severe calcification and possible occlusion of FA,POP A bilaterally.  *See table(s) above for measurements and observations. Electronically signed by Deitra Mayo MD on 03/24/2018 at 8:34:34 PM.    Final    US Abdomen Limited Ruq  Result Date: 02/28/2018 CLINICAL DATA:  45-year-old with elevated liver function studies. EXAM: ULTRASOUND ABDOMEN LIMITED RIGHT UPPER QUADRANT COMPARISON:  None. FINDINGS: Gallbladder: The gallbladder is mildly distended. There is a 3 mm gallbladder polyp. No gallstones, gallbladder wall thickening or sonographic Murphy's sign. Common bile duct: Diameter: 4 mm Liver: No focal lesion identified. Within normal limits in parenchymal echogenicity. Portal vein is patent on color Doppler imaging with normal direction of blood flow towards the liver. Incidental imaging of the right kidney demonstrates a small cyst in the lower pole measuring 8 mm maximally as well as a complex adjacent echogenic lesion measuring 12 x 10 x 10 mm. This demonstrates no gross blood flow on this limited examination. Echogenic shadowing foci are present in the mid right kidney consistent with calculi. IMPRESSION: 1. No evidence of gallstones, biliary dilatation or cholecystitis. Tiny gallbladder polyp. 2. The liver appears unremarkable. 3. Indeterminate small mass in the lower pole of the right kidney. This could reflect a complex cyst or solid lesion. Further evaluation recommended. Given the patient's renal insufficiency, noncontrast renal MRI is likely the best available imaging option. Electronically Signed   By: Richardean Sale M.D.   On: 02/28/2018 09:01   Ir Angio Intra Extracran Sel Com Carotid Innominate Bilat Mod Sed  Result Date: 03/12/2018 CLINICAL DATA:  Bihemispheric ischemic strokes. Severe stenosis of the left  internal carotid artery on CT angiogram of the head and neck. EXAM: IR ANGIO VERTEBRAL SEL VERTEBRAL BILAT MOD SED; BILATERAL COMMON CAROTID AND INNOMINATE ANGIOGRAPHY COMPARISON:  CT angiogram of the head and neck of 02/27/2018, and MRI of the brain of 03/02/2018. MEDICATIONS: Heparin 1000 units IV; no antibiotic was administered within 1 hour of the procedure. ANESTHESIA/SEDATION: Versed 1 mg IV; Fentanyl 25 mcg IV Moderate Sedation Time:  28 minutes The patient was continuously monitored during the procedure by the interventional radiology nurse under my direct supervision. CONTRAST:  Isovue 300 approximately 60 mL FLUOROSCOPY TIME:  Fluoroscopy Time: 9 minutes 6 seconds (786 mGy). COMPLICATIONS: None immediate. TECHNIQUE: Informed written consent was obtained from  the patient after a thorough discussion of the procedural risks, benefits and alternatives. All questions were addressed. Maximal Sterile Barrier Technique was utilized including caps, mask, sterile gowns, sterile gloves, sterile drape, hand hygiene and skin antiseptic. A timeout was performed prior to the initiation of the procedure. The left groin was prepped and draped in the usual sterile fashion. Thereafter using modified Seldinger technique, transfemoral access into the left common femoral artery was obtained without difficulty. Over a 0.035 inch guidewire, a 5 French Pinnacle sheath was inserted. Through this, and also over 0.035 inch guidewire, a 5 [place] JB 1 catheter was advanced to the aortic arch region and selectively positioned in the right common carotid artery, the right vertebral artery, the left common carotid artery and the left vertebral artery. FINDINGS: The right vertebral origin demonstrates a severe segmental stenosis at its origin and just distally. Distal to this there are focal areas of mild narrowing involving the proximal 1/3 of the right vertebral artery. More distally the vessel is seen to opacify to the cranial skull  base. Opacification is seen of the basilar artery, the anterior-inferior cerebellar arteries, the left superior cerebellar artery and the right posterior-inferior cerebellar artery. Unopacified blood is seen in the basilar artery from contralateral vertebral artery. 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 The right common carotid arteriogram demonstrates the right external carotid artery and its major branches to be widely patent. The right internal carotid artery at the bulb demonstrates approximately 50% stenosis secondary to a smooth circumferential plaque without evidence of ulcerations or of intraluminal filling defects. The vessel distal to this is seen to opacify to the cranial skull base. There is approximately 50% stenosis of the distal cervical segment of the right internal carotid artery. More distally the petrous, the cavernous and the supraclinoid segments are widely patent. The right middle cerebral artery and the right anterior cerebral artery opacify into the capillary and venous phases. There is prompt cross filling via the anterior communicating artery of the left anterior cerebral artery A2 segment and distally. Also seen is opacification of the right posterior communicating artery opacifying the right posterior cerebral artery distribution, with partial retrograde opacification into the distal basilar artery and flash filling of the superior cerebellar arteries. The left vertebral artery origin is from the aortic arch between the origins of the left subclavian artery and left common carotid artery. The vessel is seen to opacify to the cranial skull base where it supplies the left posterior inferior cerebellar artery distribution. A left vertebrobasilar junction is seen continuing with the basilar artery. There is opacification of the superior cerebellar arteries, the posterior cerebral arteries, and the anterior-inferior cerebellar arteries into the capillary and venous  phases. Unopacified blood is seen in the basilar artery from the contralateral vertebral artery. The left subclavian arteriogram demonstrates a normal appearing thyrocervical trunk with patency of the ascending cervical branch and the internal mammary branch. The left common carotid arteriogram demonstrates the left external carotid artery and its major branches to be widely patent. The left internal carotid artery just distal to the bulb demonstrates a severe high-grade 90% stenosis. The bulb itself demonstrates a caliber irregularity with a small ulceration along the posterolateral wall of the bulb. There is a narrowing of approximately 20% at the bulb by the NASCET criteria. More distally the left internal carotid artery is seen to opacify to the cranial skull base. There is mild stenosis at the proximal petrous segment. More distally the petrous segment is widely  patent. There is a 50% stenosis at the caval cavernous segment of the left ICA. The supraclinoid segment is widely patent. The left middle cerebral artery and the left anterior cerebral artery opacify into the capillary and venous phases. 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 Approximately 50% stenosis of the right internal carotid artery at the bulb by the NASCET criteria. Approximately 50% stenosis of the caval cavernous segment of the left internal carotid artery, and 50% stenosis of the right internal carotid artery distal cervical segment. Severe high-grade stenosis at the origin of the right vertebral artery. PLAN: Findings discussed with the referring neurologist and the primary caregiver. Patient deemed appropriate for revascularization of the left internal carotid artery with stent assisted angioplasty and distal protection. Electronically Signed   By: Luanne Bras M.D.   On: 03/10/2018 13:18   Ir Angio Vertebral  Sel Vertebral Bilat Mod Sed  Result Date: 03/12/2018 CLINICAL DATA:  Bihemispheric ischemic strokes. Severe stenosis of the left internal carotid artery on CT angiogram of the head and neck. EXAM: IR ANGIO VERTEBRAL SEL VERTEBRAL BILAT MOD SED; BILATERAL COMMON CAROTID AND INNOMINATE ANGIOGRAPHY COMPARISON:  CT angiogram of the head and neck of 02/27/2018, and MRI of the brain of 03/02/2018. MEDICATIONS: Heparin 1000 units IV; no antibiotic was administered within 1 hour of the procedure. ANESTHESIA/SEDATION: Versed 1 mg IV; Fentanyl 25 mcg IV Moderate Sedation Time:  28 minutes The patient was continuously monitored during the procedure by the interventional radiology nurse under my direct supervision. CONTRAST:  Isovue 300 approximately 60 mL FLUOROSCOPY TIME:  Fluoroscopy Time: 9 minutes 6 seconds (786 mGy). COMPLICATIONS: None immediate. TECHNIQUE: Informed written consent was obtained from the patient after a thorough discussion of the procedural risks, benefits and alternatives. All questions were addressed. Maximal Sterile Barrier Technique was utilized including caps, mask, sterile gowns, sterile gloves, sterile drape, hand hygiene and skin antiseptic. A timeout was performed prior to the initiation of the procedure. The left groin was prepped and draped in the usual sterile fashion. Thereafter using modified Seldinger technique, transfemoral access into the left common femoral artery was obtained without difficulty. Over a 0.035 inch guidewire, a 5 French Pinnacle sheath was inserted. Through this, and also over 0.035 inch guidewire, a 5 [place] JB 1 catheter was advanced to the aortic arch region and selectively positioned in the right common carotid artery, the right vertebral artery, the left common carotid artery and the left vertebral artery. FINDINGS: The right vertebral origin demonstrates a severe segmental stenosis at its origin and just distally. Distal to this there are focal areas of mild  narrowing involving the proximal 1/3 of the right vertebral artery. More distally the vessel is seen to opacify to the cranial skull base. Opacification is seen of the basilar artery, the anterior-inferior cerebellar arteries, the left superior cerebellar artery and the right posterior-inferior cerebellar artery. Unopacified blood is seen in the basilar artery from contralateral vertebral artery. 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 The right common carotid arteriogram demonstrates the right external carotid artery and its major branches to be widely patent. The right internal carotid artery at the bulb demonstrates approximately 50% stenosis secondary to a smooth circumferential plaque without evidence of ulcerations or of intraluminal filling defects. The vessel distal to this is seen to opacify to the cranial skull base. There is approximately 50%  stenosis of the distal cervical segment of the right internal carotid artery. More distally the petrous, the cavernous and the supraclinoid segments are widely patent. The right middle cerebral artery and the right anterior cerebral artery opacify into the capillary and venous phases. There is prompt cross filling via the anterior communicating artery of the left anterior cerebral artery A2 segment and distally. Also seen is opacification of the right posterior communicating artery opacifying the right posterior cerebral artery distribution, with partial retrograde opacification into the distal basilar artery and flash filling of the superior cerebellar arteries. The left vertebral artery origin is from the aortic arch between the origins of the left subclavian artery and left common carotid artery. The vessel is seen to opacify to the cranial skull base where it supplies the left posterior inferior cerebellar artery distribution. A left vertebrobasilar junction is seen continuing with the basilar artery. There is opacification of the superior  cerebellar arteries, the posterior cerebral arteries, and the anterior-inferior cerebellar arteries into the capillary and venous phases. Unopacified blood is seen in the basilar artery from the contralateral vertebral artery. The left subclavian arteriogram demonstrates a normal appearing thyrocervical trunk with patency of the ascending cervical branch and the internal mammary branch. The left common carotid arteriogram demonstrates the left external carotid artery and its major branches to be widely patent. The left internal carotid artery just distal to the bulb demonstrates a severe high-grade 90% stenosis. The bulb itself demonstrates a caliber irregularity with a small ulceration along the posterolateral wall of the bulb. There is a narrowing of approximately 20% at the bulb by the NASCET criteria. More distally the left internal carotid artery is seen to opacify to the cranial skull base. There is mild stenosis at the proximal petrous segment. More distally the petrous segment is widely patent. There is a 50% stenosis at the caval cavernous segment of the left ICA. The supraclinoid segment is widely patent. The left middle cerebral artery and the left anterior cerebral artery opacify into the capillary and venous phases. 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 Approximately 50% stenosis of the right internal carotid artery at the bulb by the NASCET criteria. Approximately 50% stenosis of the caval cavernous segment of the left internal carotid artery, and 50% stenosis of the right internal carotid artery distal cervical segment. Severe high-grade stenosis at the origin of the right vertebral artery. PLAN: Findings discussed with the referring neurologist and the primary caregiver. Patient deemed appropriate for revascularization of the left internal carotid artery with stent assisted  angioplasty and distal protection. Electronically Signed   By: Luanne Bras M.D.   On: 03/10/2018 13:18    Echocardiogram 02/28/2018 IMPRESSIONS  1. The left ventricle has moderate-severely reduced systolic function, with an ejection fraction of 30-35%. The cavity size was mild to moderately dilated. There is moderate concentric left ventricular hypertrophy. Left ventricular diastolic Doppler  parameters are consistent with impaired relaxation Left ventricular diffuse hypokinesis.  2. The right ventricle has normal systolic function. The cavity was normal. There is no increase in right ventricular wall thickness.  3. Left atrial size was moderately dilated.  4. The mitral valve is normal in structure. Moderate thickening of the mitral valve leaflet. Moderate calcification of the anterior and posterior mitral valve leaflets. There is moderate mitral annular calcification present. Mild mitral valve stenosis.  5. The tricuspid valve  is normal in structure.  6. The aortic valve is tricuspid Moderate thickening of the aortic valve Moderate calcification of the aortic valve. Aortic valve regurgitation is moderate by color flow Doppler. mild stenosis of the aortic valve.  7. Right atrial pressure is estimated at 3 mmHg.    Echocardiogram with bubble study 03/03/2018 IMPRESSIONS  1. Prior to sedation, a transthoracic agitated saline exam was performed. No atrial level shunt detected at rest or with Valsalva release.  2. Unable to sedate patient adequately for passage of TEE probe beyond posterior oropharynx. Procedure aborted.  SUMMARY  Consider TEE with anesthesia support if clinically indicated.  FINDINGS  Interatrial Septum: Agitated saline contrast was given intravenously to evaluate for intracardiac shunting.   Heart cath 03/06/2018 1.  Significant three-vessel coronary artery disease with hazy 85% stenosis in the proximal LAD which appears to be the culprit for non-STEMI.  There is also  significant in-stent restenosis into OM1 and borderline disease in the right coronary artery. 2.  Left ventricular angiography was not performed.  EF was moderately reduced by echo. 3.  Normal left ventricular end-diastolic pressure. 4.  Successful direct stenting of the proximal LAD with drug-eluting stent and [place] angioplasty of ostial OM1.  Recommendations: Dual antiplatelet therapy with aspirin and clopidogrel for at least 1 year.  Aggressive treatment of risk factors.  I suspect the right coronary artery can be treated medically without the need for revascularization.     Discharge Exam: Vitals:   03/28/18 0453 03/28/18 0823  BP: (!) 149/58 (!) 118/50  Pulse: 64 62  Resp: 16 16  Temp: 98.5 F (36.9 C) 98.2 F (36.8 C)  SpO2: 98% 99%    General: Pt is alert, awake, not in acute distress Cardiovascular: RRR, S1/S2 +, no rubs, no gallops Respiratory: CTA bilaterally, no wheezing, no rhonchi Abdominal: Soft, NT, ND, bowel sounds + Extremities: no edema, no cyanosis    The results of significant diagnostics from this hospitalization (including imaging, microbiology, ancillary and laboratory) are listed below for reference.     Microbiology: No results found for this or any previous visit (from the past 240 hour(s)).   Labs: BNP (last 3 results) No results for input(s): BNP in the last 8760 hours. Basic Metabolic Panel: Recent Labs  Lab 03/23/18 0532 03/24/18 0406 03/25/18 0430 03/26/18 0402 03/27/18 0549  NA 134* 136 137 137 138  K 4.3 3.7 4.1 4.1 4.3  CL 98 97* 99 100 101  CO2 20* _0 GLUCOSE 130* 130* 122* 142* 113*  BUN 42* 26* 41* 37* 48*  CREATININE 10.70* 7.95* 10.29* 9.26* 11.78*  CALCIUM 8.3* 8.3* 8.5* 8.2* 8.6*  PHOS  --  4.5 4.6  --   --    Liver Function Tests: Recent Labs  Lab 03/24/18 0406 03/25/18 0430  ALBUMIN 2.6* 2.7*   No results for input(s): LIPASE, AMYLASE in the last 168 hours. No results for input(s):  AMMONIA in the last 168 hours. CBC: Recent Labs  Lab 03/22/18 1217 03/23/18 0532 03/24/18 0406 03/25/18 0430 03/26/18 0402  WBC 9.2 10.8* 8.9 10.0 10.4  HGB 8.9* 8.9* 9.1* 8.7* 8.8*  HCT 26.3* 27.4* 28.2* 27.2* 27.0*  MCV 89.8 90.7 91.3 92.5 93.1  PLT 68*  67* 81* 105* 168 231   Cardiac Enzymes: No results for input(s): CKTOTAL, CKMB, CKMBINDEX, TROPONINI in the last 168 hours. BNP: Invalid input(s): POCBNP CBG: Recent Labs  Lab 03/23/18 1051  GLUCAP 100*   D-Dimer No results  for input(s): DDIMER in the last 72 hours. Hgb A1c No results for input(s): HGBA1C in the last 72 hours. Lipid Profile No results for input(s): CHOL, HDL, LDLCALC, TRIG, CHOLHDL, LDLDIRECT in the last 72 hours. Thyroid function studies No results for input(s): TSH, T4TOTAL, T3FREE, THYROIDAB in the last 72 hours.  Invalid input(s): FREET3 Anemia work up No results for input(s): VITAMINB12, FOLATE, FERRITIN, TIBC, IRON, RETICCTPCT in the last 72 hours. Urinalysis No results found for: COLORURINE, APPEARANCEUR, LABSPEC, [place], GLUCOSEU, HGBUR, BILIRUBINUR, KETONESUR, PROTEINUR, UROBILINOGEN, NITRITE, LEUKOCYTESUR Sepsis Labs Invalid input(s): PROCALCITONIN,  WBC,  LACTICIDVEN Microbiology No results found for this or any previous visit (from the past 240 hour(s)).   Patient was seen and examined on the day of discharge and was found to be in stable condition. Time coordinating discharge: 35 minutes including assessment and coordination of care, as well as examination of the patient.   SIGNED:  Dessa Phi, DO [facility] [email or web address] 03/28/2018, 12:32 PM

## 2018-03-29 NOTE — Progress Notes (Signed)
  PROGRESS NOTE  Patient could not discharge yesterday due to having re-eval by IR yesterday afternoon for bleeding around his [facility] site. RN was advised to keep patient at 45 degree angle and monitor site for bleeding for 2 hours. By then, daughter stated it was too late to pick up patient for discharge.   Patient seen today. No new complaints. TDC dressing is clean and dry. Ready for discharge home.   Dessa Phi, DO [facility] [email or web address] 03/29/2018, 8:29 AM

## 2018-03-29 NOTE — Plan of Care (Signed)
Pt is progressing well for discharge

## 2018-03-29 NOTE — Progress Notes (Signed)
Patient being discharged home with home health. Patient transported by daughter. IV removed with catheter intact.

## 2018-05-29 ENCOUNTER — Other Ambulatory Visit: Payer: Self-pay

## 2018-05-29 ENCOUNTER — Telehealth (HOSPITAL_COMMUNITY): Payer: Self-pay | Admitting: Radiology

## 2018-05-29 DIAGNOSIS — I739 Peripheral vascular disease, unspecified: Secondary | ICD-10-CM

## 2018-05-29 NOTE — Telephone Encounter (Signed)
Called pt, left VM for him to call to schedule stroke f/u consult with Deveshwar. JM

## 2018-06-01 ENCOUNTER — Encounter: Payer: Medicare HMO | Admitting: Surgery

## 2018-06-01 ENCOUNTER — Encounter (HOSPITAL_COMMUNITY): Payer: Medicare HMO

## 2018-06-09 ENCOUNTER — Ambulatory Visit (HOSPITAL_COMMUNITY)
Admission: RE | Admit: 2018-06-09 | Discharge: 2018-06-09 | Disposition: A | Discharge status: Home / Self Care | Payer: Medicare HMO | Source: Ambulatory Visit | Attending: Family | Admitting: Family

## 2018-06-09 ENCOUNTER — Ambulatory Visit: Payer: Medicare HMO | Admitting: Vascular Surgery

## 2018-06-09 ENCOUNTER — Other Ambulatory Visit: Payer: Self-pay

## 2018-06-09 ENCOUNTER — Encounter: Payer: Self-pay | Admitting: Vascular Surgery

## 2018-06-09 DIAGNOSIS — I739 Peripheral vascular disease, unspecified: Secondary | ICD-10-CM | POA: Insufficient documentation

## 2018-06-09 NOTE — Progress Notes (Signed)
Patient name: Peter [name] MRN: [id number] DOB: 01-Sep-1945 Sex: male  REASON FOR CONSULT: Evaluate for PAD  HPI: Peter [name] is a 73 y.o. male, with history of congestive heart failure (EF 30-35%), end-stage renal disease on hemodialysis, hypertension and recent acute CVA with left sided weakness and underwent left ICA stent by IR, and coronary artery disease status post recent NSTEMI requiring PCI that presents for evaluation of possible bilateral femoral artery and popliteal occlusions.  Patient ultimately underwent lower extremity bilateral venous DVT study on 03/24/2018 while hospitalized for his stroke.  No evidence of DVT but there was incidental findings of severe calcification possible occlusions of the femoral artery and popliteal artery bilaterally.  On evaluation today, patient states he does endorse some pain in the top of his right foot that has been intermittent.  He states the last episode was early last week when he was dialyzing.  States the toes themselves are not bothersome.  Does not usually wake up at night with pain in his feet.  No problems with left leg.  He did have bilateral calf claudication prior to a stroke but now has residual left-sided weakness and cannot walk very far with a cane and states he does not really claudicate anymore given limited moblility.  At most he can walk about 50 feet.  He is on aspirin and Brilinta per IR following carotid stent.  Past Medical History:  Diagnosis Date   Anemia    CAD (coronary artery disease)    CHF (congestive heart failure) (HCC)    Diabetes ([place])    ESRD (end stage renal disease) on dialysis ([place])    Headache    HTN (hypertension)    PEA (Pulseless electrical activity) ([place]) 11/2017   Traumatic brain injury [facility])     Past Surgical History:  Procedure Laterality Date   CORONARY BALLOON ANGIOPLASTY N/A 03/06/2018   Procedure: CORONARY BALLOON ANGIOPLASTY;  Surgeon: Wellington Hampshire, MD;  Location: [place] [facility];  Service: Cardiovascular;  Laterality: N/A;   CORONARY STENT INTERVENTION N/A 03/06/2018   Procedure: CORONARY STENT INTERVENTION;  Surgeon: Wellington Hampshire, MD;  Location: [place] [facility];  Service: Cardiovascular;  Laterality: N/A;   IR ANGIO INTRA EXTRACRAN SEL COM CAROTID INNOMINATE BILAT MOD SED  03/10/2018   IR ANGIO VERTEBRAL SEL VERTEBRAL BILAT MOD SED  03/10/2018   IR FLUORO GUIDE CV LINE RIGHT  03/27/2018   IR INTRAVSC STENT CERV CAROTID W/EMB-PROT MOD SED INCL ANGIO  03/20/2018   LEFT HEART CATH AND CORONARY ANGIOGRAPHY N/A 03/06/2018   Procedure: LEFT HEART CATH AND CORONARY ANGIOGRAPHY;  Surgeon: Wellington Hampshire, MD;  Location: [place] [facility];  Service: Cardiovascular;  Laterality: N/A;   RADIOLOGY WITH ANESTHESIA N/A 03/20/2018   Procedure: RADIOLOGY WITH ANESTHESIA STENTING;  Surgeon: Luanne Bras, MD;  Location: [place];  Service: Radiology;  Laterality: N/A;    Family History  Problem Relation Age of Onset   Hypertension Father    AAA (abdominal aortic aneurysm) Father     SOCIAL HISTORY: Social History   Socioeconomic History   Marital status: Single    Spouse name: Not on file   Number of children: Not on file   Years of education: Not on file   Highest education level: Not on file  Occupational History   Not on file  Social Needs   Financial resource strain: Not on file   Food insecurity:    Worry: Not on file  Inability: Not on file   Transportation needs:    Medical: Not on file    Non-medical: Not on file  Tobacco Use   Smoking status: Former Smoker   Smokeless tobacco: Never Used  Substance and Sexual Activity   Alcohol use: Never    Frequency: Never   Drug use: Never   Sexual activity: Not Currently  Lifestyle   Physical activity:    Days per week: Not on file    Minutes per session: Not on file   Stress: Not on file  Relationships   Social connections:    Talks on phone: Not on file    Gets together:  Not on file    Attends religious service: Not on file    Active member of club or organization: Not on file    Attends meetings of clubs or organizations: Not on file    Relationship status: Not on file   Intimate partner violence:    Fear of current or ex partner: Not on file    Emotionally abused: Not on file    Physically abused: Not on file    Forced sexual activity: Not on file  Other Topics Concern   Not on file  Social History Narrative   Not on file    Allergies  Allergen Reactions   Metformin And Related Other (See Comments)    Contraindicated due to renal failure.  Renal failure-on peritoneal dialysis     Pioglitazone Other (See Comments)    History of congestive heart failure.     Propoxyphene Rash    Current Outpatient Medications  Medication Sig Dispense Refill   acetaminophen (TYLENOL) 500 MG tablet Take 1,500 mg by mouth daily as needed (pain).      amLODipine (NORVASC) 10 MG tablet Take 1 tablet (10 mg total) by mouth at bedtime. 30 tablet 0   aspirin 81 MG chewable tablet Chew 1 tablet (81 mg total) by mouth daily. 30 tablet 0   atorvastatin (LIPITOR) 40 MG tablet Take 40 mg by mouth daily at 6 PM.     Calcium Carbonate Antacid (TUMS PO) Take 1-3 mg by mouth See admin instructions. Take 3 tablets by mouth up to three times daily with full meals, take 1 tablet with snacks/small meals     famotidine (PEPCID) 20 MG tablet Take 20 mg by mouth at bedtime.     isosorbide mononitrate (IMDUR) 60 MG 24 hr tablet Take 1 tablet (60 mg total) by mouth daily. 30 tablet 0   losartan (COZAAR) 100 MG tablet Take 1 tablet (100 mg total) by mouth daily. 30 tablet 0   metoprolol (TOPROL-XL) 200 MG 24 hr tablet Take 1 tablet (200 mg total) by mouth daily. 30 tablet 0   multivitamin (RENA-VIT) TABS tablet Take 1 tablet by mouth daily.     nitroGLYCERIN (NITROSTAT) 0.4 MG SL tablet Place 0.4 mg under the tongue every 5 (five) minutes as needed for chest pain.        polyvinyl alcohol (ARTIFICIAL TEARS) 1.4 % ophthalmic solution Place 1 drop into both eyes daily as needed for dry eyes.     ticagrelor (BRILINTA) 90 MG TABS tablet Take 1 tablet (90 mg total) by mouth 2 (two) times daily. 60 tablet 0   No current facility-administered medications for this visit.     REVIEW OF SYSTEMS:  [X]  denotes positive finding, [ ]  denotes negative finding Cardiac  Comments:  Chest pain or chest pressure:    Shortness of breath upon  exertion:    Short of breath when lying flat:    Irregular heart rhythm:        Vascular    Pain in calf, thigh, or hip brought on by ambulation: x Prior to stroke, improved now after stroke given limited mobility  Pain in feet at night that wakes you up from your sleep:     Blood clot in your veins:    Leg swelling:         Pulmonary    Oxygen at home:    Productive cough:     Wheezing:         Neurologic    Sudden weakness in arms or legs:     Sudden numbness in arms or legs:     Sudden onset of difficulty speaking or slurred speech:    Temporary loss of vision in one eye:     Problems with dizziness:         Gastrointestinal    Blood in stool:     Vomited blood:         Genitourinary    Burning when urinating:     Blood in urine:        Psychiatric    Major depression:         Hematologic    Bleeding problems:    Problems with blood clotting too easily:        Skin    Rashes or ulcers:        Constitutional    Fever or chills:      PHYSICAL EXAM: Vitals:   06/09/18 1130  BP: (!) 156/70  Pulse: 65  Resp: 16  Temp: 98.3 F (36.8 C)  TempSrc: Oral  SpO2: 100%  Weight: 154 lb (69.9 kg)  Height: 6\' 3"  (1.905 m)    GENERAL: The patient is a well-nourished male, in no acute distress. The vital signs are documented above. CARDIAC: There is a regular rate and rhythm.  VASCULAR:  Left radial 1+ palpable, right radial non-palpable Femoral arteries 2+ palpable bilaterally Monophasic DP/PT signals  bilaterally No tissue loss or open wounds Hair loss bilateral lower extremities PULMONARY: There is good air exchange bilaterally without wheezing or rales. ABDOMEN: Soft and non-tender with normal pitched bowel sounds.  MUSCULOSKELETAL: There are no major deformities or cyanosis. NEUROLOGIC: No focal weakness or paresthesias are detected.   DATA:   I independently reviewed his noninvasive imaging that shows an ABI of 0.34 on the right 0.44 on the left with monophasic waveform  Assessment/Plan:  73 year old male that presents with severe bilateral lower extremity PAD and history of short distance lifestyle limiting claudication.  He recently had a stroke and an NSTEMI in March and ultimately has residual left-sided weakness and now has very limited mobility walks with a cane.  He states due to his limited mobility he no longer has the calf pain that he had before.  On review of his noninvasive imaging today he has severely reduced ABIs with monophasic waveforms.  I do think the pain in the top of his right foot is likely consistent with early rest pain although it only  Occurs intermittently which is unusual.  I did offer him bilateral lower extremity arteriogram with runoff and possible intervention given his history and non-invasive imaing.  Plan to intervene on right leg first.  Ultimately patient would like to defer at this time and states he is trying to rehab from his stroke and would like to come back in  3 months to be reevaluated.  I did provide information to contact our office if things get worse earlier and discussed if he develops a wound or other tissue loss he needs let our office know immediately.  Discussed risk of progression and possible limb loss without intervention.   Marty Heck, MD [facility] Office: [phone] Pager: [phone]

## 2018-06-16 ENCOUNTER — Telehealth: Payer: Self-pay | Admitting: Neurology

## 2018-06-16 NOTE — Telephone Encounter (Signed)
I called pt that his appt will be video due to COVID 19. I also stated he is high risk and its recommend we do video. Pt gave verbal consent to do video and to file insurance. PT has a cell phone with a camera on it. I explain the doxy process. Pt knows the link will be text today. Pt was unsure of his mobile carrier. I stated Dr.Sethi will text link today. Pt verbalized understanding.

## 2018-06-16 NOTE — Telephone Encounter (Signed)
Pt called and stated he does not know how he is to benefit with a Virtual Visit. Pt does not consent to a VV and he would like other options offered. Please advise.

## 2018-06-17 ENCOUNTER — Emergency Department (HOSPITAL_COMMUNITY): Payer: Medicare HMO

## 2018-06-17 ENCOUNTER — Emergency Department (HOSPITAL_COMMUNITY)
Admission: EM | Admit: 2018-06-17 | Discharge: 2018-06-18 | Disposition: A | Discharge status: Home / Self Care | Payer: Medicare HMO | Attending: Emergency Medicine | Admitting: Emergency Medicine

## 2018-06-17 ENCOUNTER — Encounter (HOSPITAL_COMMUNITY): Payer: Self-pay | Admitting: Emergency Medicine

## 2018-06-17 ENCOUNTER — Other Ambulatory Visit: Payer: Self-pay

## 2018-06-17 DIAGNOSIS — Z8673 Personal history of transient ischemic attack (TIA), and cerebral infarction without residual deficits: Secondary | ICD-10-CM | POA: Insufficient documentation

## 2018-06-17 DIAGNOSIS — R531 Weakness: Secondary | ICD-10-CM | POA: Diagnosis not present

## 2018-06-17 DIAGNOSIS — N186 End stage renal disease: Secondary | ICD-10-CM | POA: Insufficient documentation

## 2018-06-17 DIAGNOSIS — Z7982 Long term (current) use of aspirin: Secondary | ICD-10-CM | POA: Diagnosis not present

## 2018-06-17 DIAGNOSIS — I132 Hypertensive heart and chronic kidney disease with heart failure and with stage 5 chronic kidney disease, or end stage renal disease: Secondary | ICD-10-CM | POA: Diagnosis not present

## 2018-06-17 DIAGNOSIS — I5022 Chronic systolic (congestive) heart failure: Secondary | ICD-10-CM | POA: Insufficient documentation

## 2018-06-17 DIAGNOSIS — E1122 Type 2 diabetes mellitus with diabetic chronic kidney disease: Secondary | ICD-10-CM | POA: Diagnosis not present

## 2018-06-17 DIAGNOSIS — Z79899 Other long term (current) drug therapy: Secondary | ICD-10-CM | POA: Insufficient documentation

## 2018-06-17 DIAGNOSIS — Z87891 Personal history of nicotine dependence: Secondary | ICD-10-CM | POA: Insufficient documentation

## 2018-06-17 DIAGNOSIS — I2510 Atherosclerotic heart disease of native coronary artery without angina pectoris: Secondary | ICD-10-CM | POA: Insufficient documentation

## 2018-06-17 DIAGNOSIS — Z992 Dependence on renal dialysis: Secondary | ICD-10-CM | POA: Diagnosis not present

## 2018-06-17 LAB — CBC
HCT: 30.8 % — ABNORMAL LOW (ref 39.0–52.0)
Hemoglobin: 9.8 g/dL — ABNORMAL LOW (ref 13.0–17.0)
MCH: 30.4 pg (ref 26.0–34.0)
MCHC: 31.8 g/dL (ref 30.0–36.0)
MCV: 95.7 fL (ref 80.0–100.0)
Platelets: 220 10*3/uL (ref 150–400)
RBC: 3.22 MIL/uL — ABNORMAL LOW (ref 4.22–5.81)
RDW: 16.1 % — ABNORMAL HIGH (ref 11.5–15.5)
WBC: 8.2 10*3/uL (ref 4.0–10.5)
nRBC: 0 % (ref 0.0–0.2)

## 2018-06-17 LAB — COMPREHENSIVE METABOLIC PANEL
ALT: 14 U/L (ref 0–44)
AST: 15 U/L (ref 15–41)
Albumin: 3.4 g/dL — ABNORMAL LOW (ref 3.5–5.0)
Alkaline Phosphatase: 114 U/L (ref 38–126)
Anion gap: 11 (ref 5–15)
BUN: 20 mg/dL (ref 8–23)
CO2: 28 mmol/L (ref 22–32)
Calcium: 9.1 mg/dL (ref 8.9–10.3)
Chloride: 101 mmol/L (ref 98–111)
Creatinine, Ser: 6.73 mg/dL — ABNORMAL HIGH (ref 0.61–1.24)
GFR calc Af Amer: 9 mL/min — ABNORMAL LOW (ref >60)
GFR calc non Af Amer: 8 mL/min — ABNORMAL LOW (ref >60)
Glucose, Bld: 169 mg/dL — ABNORMAL HIGH (ref 70–99)
Potassium: 3.8 mmol/L (ref 3.5–5.1)
Sodium: 140 mmol/L (ref 135–145)
Total Bilirubin: 0.8 mg/dL (ref 0.3–1.2)
Total Protein: 7 g/dL (ref 6.5–8.1)

## 2018-06-17 LAB — DIFFERENTIAL
Abs Immature Granulocytes: 0.02 10*3/uL (ref 0.00–0.07)
Basophils Absolute: 0.1 10*3/uL (ref 0.0–0.1)
Basophils Relative: 1 %
Eosinophils Absolute: 1 10*3/uL — ABNORMAL HIGH (ref 0.0–0.5)
Eosinophils Relative: 12 %
Immature Granulocytes: 0 %
Lymphocytes Relative: 20 %
Lymphs Abs: 1.6 10*3/uL (ref 0.7–4.0)
Monocytes Absolute: 0.8 10*3/uL (ref 0.1–1.0)
Monocytes Relative: 10 %
Neutro Abs: 4.7 10*3/uL (ref 1.7–7.7)
Neutrophils Relative %: 57 %

## 2018-06-17 LAB — APTT: aPTT: 36 seconds (ref 24–36)

## 2018-06-17 LAB — PROTIME-INR
INR: 1.1 (ref 0.8–1.2)
Prothrombin Time: 14 seconds (ref 11.4–15.2)

## 2018-06-17 MED ORDER — SODIUM CHLORIDE 0.9% FLUSH
3.0000 mL | Freq: Once | INTRAVENOUS | Status: DC
Start: 2018-06-17 — End: 2018-06-18

## 2018-06-17 NOTE — ED Triage Notes (Signed)
Pt BIB [facility] from home, c/o bilateral leg weakness that started at 1430 during dialysis today. Hx of stroke Feb. 2020, with left sided deficits, no new unilateral deficits. Pt assisted with 2 person assist from EMS stretcher to wheelchair. Speech clear.

## 2018-06-18 ENCOUNTER — Emergency Department (HOSPITAL_COMMUNITY): Payer: Medicare HMO

## 2018-06-18 ENCOUNTER — Ambulatory Visit (INDEPENDENT_AMBULATORY_CARE_PROVIDER_SITE_OTHER): Payer: Medicare HMO | Admitting: Neurology

## 2018-06-18 ENCOUNTER — Encounter: Payer: Self-pay | Admitting: Neurology

## 2018-06-18 ENCOUNTER — Other Ambulatory Visit: Payer: Self-pay

## 2018-06-18 DIAGNOSIS — I63422 Cerebral infarction due to embolism of left anterior cerebral artery: Secondary | ICD-10-CM

## 2018-06-18 LAB — I-STAT CHEM 8, ED
BUN: 22 mg/dL (ref 8–23)
Calcium, Ion: 1.09 mmol/L — ABNORMAL LOW (ref 1.15–1.40)
Chloride: 101 mmol/L (ref 98–111)
Creatinine, Ser: 7.2 mg/dL — ABNORMAL HIGH (ref 0.61–1.24)
Glucose, Bld: 166 mg/dL — ABNORMAL HIGH (ref 70–99)
HCT: 31 % — ABNORMAL LOW (ref 39.0–52.0)
Hemoglobin: 10.5 g/dL — ABNORMAL LOW (ref 13.0–17.0)
Potassium: 3.8 mmol/L (ref 3.5–5.1)
Sodium: 140 mmol/L (ref 135–145)
TCO2: 30 mmol/L (ref 22–32)

## 2018-06-18 MED ORDER — AMLODIPINE BESYLATE 5 MG PO TABS
10.0000 mg | ORAL_TABLET | Freq: Once | ORAL | Status: AC
  Administered 2018-06-18: 02:00:00 10 mg via ORAL
  Filled 2018-06-18: qty 2

## 2018-06-18 MED ORDER — LORAZEPAM 2 MG/ML IJ SOLN
1.0000 mg | Freq: Once | INTRAMUSCULAR | Status: AC | PRN
  Administered 2018-06-18: 04:00:00 1 mg via INTRAVENOUS
  Filled 2018-06-18: qty 1

## 2018-06-18 NOTE — Discharge Instructions (Signed)
Your work-up in the emergency department was reassuring.  Your MRI did not show evidence of any new stroke.  We recommend that you continue your daily prescribed medications and follow-up with your primary care doctor by the end of the week for repeat assessment.

## 2018-06-18 NOTE — ED Notes (Signed)
Pt discharged from ED; instructions provided; Pt encouraged to return to ED if symptoms worsen and to f/u with PCP; Pt verbalized understanding of all instructions 

## 2018-06-18 NOTE — ED Provider Notes (Signed)
[place] EMERGENCY DEPARTMENT Provider Note   CSN: [id number] Arrival date & time: 06/17/18  1723    History   Chief Complaint Chief Complaint  Patient presents with   Weakness    HPI Peter [name] is a 73 y.o. male.     72 y.o. male with history of CHF (EF 30-35%), ESRD on MWF HD, HTN, CVA with residual left sided weakness (s/p left ICA stent by IR), CAD, NSTEMI s/p PCI, PAD presents to the ED for left sided weakness. He reports weakness that is worse than his known residual L sided deficits. Symptoms began at ~1430 while at dialysis. Symptoms have been persistent, largely unchanged. Reporting associated paresthesias in BLE and nonspecific headache. He has had some SOB and DOE since prior hospitalization for stroke and NSTEMI. SOB/DOE have not worsened today. No fevers, chest pain, syncope, N/V. He has been compliant with his daily medications. Not on chronic anticoagulants.    Weakness    Past Medical History:  Diagnosis Date   Anemia    CAD (coronary artery disease)    CHF (congestive heart failure) (HCC)    Diabetes ([place])    ESRD (end stage renal disease) on dialysis ([place])    Headache    HTN (hypertension)    PEA (Pulseless electrical activity) ([place]) 11/2017   Traumatic brain injury [facility])     Patient Active Problem List   Diagnosis Date Noted   PAD (peripheral artery disease) ([place]) 06/09/2018   Carotid stenosis, asymptomatic, left 03/20/2018   Status post coronary artery stent placement    FTT (failure to thrive) in adult    Carotid stenosis    Chronic systolic CHF (congestive heart failure) (HCC)    NSTEMI (non-ST elevated myocardial infarction) ([place])    Cerebrovascular accident (CVA) ([place]) 02/28/2018   ESRD on dialysis ([place]) 02/28/2018   Right kidney mass 02/28/2018   TIA (transient ischemic attack) 02/28/2018   CAD (coronary artery disease)    Diabetes (HCC)    CHF (congestive heart failure) ([place])    Traumatic  brain injury ([place])    Anemia of chronic disease    Elevated troponin 02/27/2018   Elevated LFTs 02/27/2018   HTN (hypertension) 02/27/2018   PEA (Pulseless electrical activity) ([place]) 11/14/2017    Past Surgical History:  Procedure Laterality Date   CORONARY BALLOON ANGIOPLASTY N/A 03/06/2018   Procedure: CORONARY BALLOON ANGIOPLASTY;  Surgeon: Wellington Hampshire, MD;  Location: [place] [facility];  Service: Cardiovascular;  Laterality: N/A;   CORONARY STENT INTERVENTION N/A 03/06/2018   Procedure: CORONARY STENT INTERVENTION;  Surgeon: Wellington Hampshire, MD;  Location: [place] [facility];  Service: Cardiovascular;  Laterality: N/A;   IR ANGIO INTRA EXTRACRAN SEL COM CAROTID INNOMINATE BILAT MOD SED  03/10/2018   IR ANGIO VERTEBRAL SEL VERTEBRAL BILAT MOD SED  03/10/2018   IR FLUORO GUIDE CV LINE RIGHT  03/27/2018   IR INTRAVSC STENT CERV CAROTID W/EMB-PROT MOD SED INCL ANGIO  03/20/2018   LEFT HEART CATH AND CORONARY ANGIOGRAPHY N/A 03/06/2018   Procedure: LEFT HEART CATH AND CORONARY ANGIOGRAPHY;  Surgeon: Wellington Hampshire, MD;  Location: [place] [facility];  Service: Cardiovascular;  Laterality: N/A;   RADIOLOGY WITH ANESTHESIA N/A 03/20/2018   Procedure: RADIOLOGY WITH ANESTHESIA STENTING;  Surgeon: Luanne Bras, MD;  Location: [place];  Service: Radiology;  Laterality: N/A;        Home Medications    Prior to Admission medications   Medication Sig Start Date End Date  Taking? Authorizing Provider  acetaminophen (TYLENOL) 500 MG tablet Take 1,500 mg by mouth daily as needed (pain).     [provider]  amLODipine (NORVASC) 10 MG tablet Take 1 tablet (10 mg total) by mouth at bedtime. 03/28/18   Dessa Phi, DO  aspirin 81 MG chewable tablet Chew 1 tablet (81 mg total) by mouth daily. 03/29/18   Dessa Phi, DO  atorvastatin (LIPITOR) 40 MG tablet Take 40 mg by mouth daily at 6 PM. 12/24/17   [provider]  Calcium Carbonate Antacid (TUMS PO) Take  1-3 mg by mouth See admin instructions. Take 3 tablets by mouth up to three times daily with full meals, take 1 tablet with snacks/small meals    [provider]  famotidine (PEPCID) 20 MG tablet Take 20 mg by mouth at bedtime. 12/22/17   [provider]  isosorbide mononitrate (IMDUR) 60 MG 24 hr tablet Take 1 tablet (60 mg total) by mouth daily. 03/28/18   Dessa Phi, DO  losartan (COZAAR) 100 MG tablet Take 1 tablet (100 mg total) by mouth daily. 03/28/18   Dessa Phi, DO  metoprolol (TOPROL-XL) 200 MG 24 hr tablet Take 1 tablet (200 mg total) by mouth daily. 03/28/18   Dessa Phi, DO  multivitamin (RENA-VIT) TABS tablet Take 1 tablet by mouth daily.    [provider]  nitroGLYCERIN (NITROSTAT) 0.4 MG SL tablet Place 0.4 mg under the tongue every 5 (five) minutes as needed for chest pain.  12/25/17   [provider]  polyvinyl alcohol (ARTIFICIAL TEARS) 1.4 % ophthalmic solution Place 1 drop into both eyes daily as needed for dry eyes.    [provider]  ticagrelor (BRILINTA) 90 MG TABS tablet Take 1 tablet (90 mg total) by mouth 2 (two) times daily. 03/28/18   Dessa Phi, DO    Family History Family History  Problem Relation Age of Onset   Hypertension Father    AAA (abdominal aortic aneurysm) Father     Social History Social History   Tobacco Use   Smoking status: Former Smoker   Smokeless tobacco: Never Used  Substance Use Topics   Alcohol use: Never    Frequency: Never   Drug use: Never     Allergies   Metformin and related; Pioglitazone; and Propoxyphene   Review of Systems Review of Systems  Neurological: Positive for weakness.  Ten systems reviewed and are negative for acute change, except as noted in the HPI.     Physical Exam Updated Vital Signs BP (!) 194/90 (BP Location: Right Arm)    Pulse 69    Temp 97.8 F (36.6 C) (Oral)    Resp 13    SpO2 100%   Physical Exam Vitals signs and nursing  note reviewed.  Constitutional:      General: He is not in acute distress.    Appearance: He is well-developed. He is not diaphoretic.     Comments: Nontoxic appearing and in NAD  HENT:     Head: Normocephalic and atraumatic.     Right Ear: External ear normal.     Left Ear: External ear normal.  Eyes:     General: No scleral icterus.    Extraocular Movements: Extraocular movements intact.     Conjunctiva/sclera: Conjunctivae normal.  Neck:     Musculoskeletal: Normal range of motion.  Cardiovascular:     Rate and Rhythm: Normal rate and regular rhythm.     Pulses: Normal pulses.  Pulmonary:  Effort: Pulmonary effort is normal. No respiratory distress.     Breath sounds: No stridor. No wheezing.     Comments: Lungs CTAB. Respirations even and unlabored. Sporadic congested sounding cough. Abdominal:     Comments: Soft, nondistended, nontender abdomen.  Musculoskeletal: Normal range of motion.     Comments: No BLE edema.  Skin:    General: Skin is warm and dry.     Coloration: Skin is not pale.     Findings: No erythema or rash.  Neurological:     Mental Status: He is alert and oriented to person, place, and time.     Comments: GCS 15. Speech is goal oriented. No significant cranial nerve deficit appreciated. Patient has normal grip strength in the R hand with 5/5 strength against resistance in all major muscle groups of the RUE and RLE. Strength against resistance in LUE and LLE is 3+-4/5. Sensation to light touch intact, but decreased in the LUE and LLE.  Psychiatric:        Behavior: Behavior normal.      ED Treatments / Results  Labs (all labs ordered are listed, but only abnormal results are displayed) Labs Reviewed  CBC - Abnormal; Notable for the following components:      Result Value   RBC 3.22 (*)    Hemoglobin 9.8 (*)    HCT 30.8 (*)    RDW 16.1 (*)    All other components within normal limits  DIFFERENTIAL - Abnormal; Notable for the following  components:   Eosinophils Absolute 1.0 (*)    All other components within normal limits  COMPREHENSIVE METABOLIC PANEL - Abnormal; Notable for the following components:   Glucose, Bld 169 (*)    Creatinine, Ser 6.73 (*)    Albumin 3.4 (*)    GFR calc non Af Amer 8 (*)    GFR calc Af Amer 9 (*)    All other components within normal limits  PROTIME-INR  APTT  URINALYSIS, ROUTINE W REFLEX MICROSCOPIC  I-STAT CHEM 8, ED    EKG EKG Interpretation  Date/Time:  Wednesday June 17 2018 17:33:02 EDT Ventricular Rate:  71 PR Interval:  176 QRS Duration: 98 QT Interval:  458 QTC Calculation: 497 R Axis:   -31 Text Interpretation:  Sinus rhythm with Premature atrial complexes Left axis deviation T wave abnormality, consider lateral ischemia Prolonged QT Abnormal ECG Confirmed by Ripley Fraise [phone]) on 06/18/2018 12:13:30 AM   Radiology Ct Head Wo Contrast  Result Date: 06/17/2018 CLINICAL DATA:  BILATERAL leg weakness that began earlier today during dialysis. History of prior stroke, hypertension, diabetes, and ESRD. EXAM: CT HEAD WITHOUT CONTRAST TECHNIQUE: Contiguous axial images were obtained from the base of the skull through the vertex without intravenous contrast. COMPARISON:  CT head 03/02/2018. MR head 03/02/2018. FINDINGS: Brain: No evidence of acute infarction, hemorrhage, hydrocephalus, extra-axial collection or mass lesion/mass effect. Chronic watershed infarctions noted on prior MR exam have not resulted in significant encephalomalacia. There is mild atrophy, not unexpected for age, with a paucity of chronic microvascular ischemic change. Vascular: Calcification of the cavernous internal carotid arteries consistent with cerebrovascular atherosclerotic disease. No signs of intracranial large vessel occlusion. Skull: Calvarium is intact. Sinuses/Orbits: No acute findings. Other: None. IMPRESSION: 1. No acute intracranial findings to explain the reported symptoms. 2. Chronic watershed  infarctions noted on prior MR have not resulted in significant encephalomalacia. 3. Normal for age cerebral volume without significant white matter disease. Electronically Signed   By: Roderic Ovens.D.  On: 06/17/2018 18:39   [name]  Result Date: 06/18/2018 CLINICAL DATA:  Focal neuro deficit with stroke suspected. Bilateral leg weakness EXAM: MRI HEAD WITHOUT CONTRAST TECHNIQUE: Multiplanar, multiecho pulse sequences of the brain and surrounding structures were obtained without intravenous contrast. COMPARISON:  Head CT from earlier today.  Brain MRI 03/02/2018 FINDINGS: Brain: No acute infarction, hemorrhage, hydrocephalus, extra-axial collection or mass lesion. Mild FLAIR hyperintensity the cerebral white matter attributed to chronic microvascular ischemia. No clear encephalomalacia at sites of prior abnormal diffusion. Small remote bilateral cerebellar infarcts cerebral volume loss without specific pattern. Vascular: Major flow voids are preserved Skull and upper cervical spine: Negative for marrow lesion Sinuses/Orbits: Negative Other: Progressively motion degraded exam which is significant by the end of the study. IMPRESSION: 1. No emergent finding. 2. Brain atrophy. 3. Progressively motion degraded study which is significant on later sequences. Electronically Signed   By: Monte Fantasia M.D.   On: 06/18/2018 04:53    Procedures Procedures (including critical care time)  Medications Ordered in ED Medications  sodium chloride flush (NS) 0.9 % injection 3 mL (has no administration in time range)  amLODipine (NORVASC) tablet 10 mg (10 mg Oral Given 06/18/18 0149)  LORazepam (ATIVAN) injection 1 mg (1 mg Intravenous Given 06/18/18 0352)     Initial Impression / Assessment and Plan / ED Course  I have reviewed the triage vital signs and the nursing notes.  Pertinent labs & imaging results that were available during my care of the patient were reviewed by me and considered in my  medical decision making (see chart for details).        73 year old male with multiple chronic comorbidities presents to the emergency department for complaints of left-sided weakness that began while he was at dialysis.  He has residual left-sided deficits following a stroke in March.  Felt as though his left-sided weakness was worse today.  Laboratory evaluation has been reassuring.  He is anemic, but improved compared to baseline.  No electrolyte derangements or leukocytosis.  EKG negative for STEMI.  Prior T wave changes appear improved today compared to prior.  He underwent CT and MRI to evaluate for new CVA.  Imaging today is negative.  Unclear cause of weakness today, though low suspicion for emergent process.  He has been instructed to follow-up with his primary care doctor and to continue his daily prescribed medications.  Return precautions discussed and provided. Patient discharged in stable condition with no unaddressed concerns.   Final Clinical Impressions(s) / ED Diagnoses   Final diagnoses:  Weakness    ED Discharge Orders    None       Antonietta Breach, PA-C 06/18/18 2334    Ripley Fraise, MD 06/18/18 [phone]

## 2018-06-18 NOTE — Progress Notes (Signed)
Virtual Visit via Video Note  I connected with Peter [name] on 06/18/18 at  9:30 AM EDT by a video enabled telemedicine application and verified that I am speaking with the correct person using two identifiers.  Location: Patient:  At home Provider: at [facility] office   I discussed the limitations of evaluation and management by telemedicine and the availability of in person appointments. The patient expressed understanding and agreed to proceed.  This meeting was performed using [email or web address] app for audio and visual.  History of Present Illness: [name] is a 74 year old African-American male seen today for initial office follow-up visit following hospital admission for stroke.  History is obtained from the patient and review of electronic medical records.  I personally reviewed imaging films in PACS.  [name] is a 73 year old African-American male with past medical history on end-stage renal disease on hemodialysis who presented on 02/27/2018 to [facility] emergency room after dialysis as a code stroke for sudden onset of left-sided weakness.  He was also found to have confusion and an brief episode of staring off.  CT scan of the head was obtained which showed no hemorrhage but density within the right M2 segment.  CT angiogram showed no evidence of large vessel occlusion.  NIH stroke scale on admission was 2 for left-sided weakness alone.  TPA was not administered due to mild deficits.  MRI scan of the brain showed scattered bilateral ACA distribution with some MCA/ACA watershed distribution infarcts.  There were small remote age bilateral cerebellar infarcts as well.  Transthoracic echo showed reduced systolic ejection fraction of 30 to 35% with moderate concentric left ventricular hypertrophy and diffuse hypokinesis.  CT angiogram of the neck showed 50% stenosis of proximal left ICA and moderate stenosis of the right vertebral artery origin.  However diagnostic cerebral catheter angiogram performed on  03/12/2018 per Dr. Estanislado Pandy showed high-grade 90% stenosis of the left ICA distal to the bulb with 50% stenosis of the right ICA at the bulb.  There was 50% stenosis of the cable cavernous segment of the left ICA and 50% stenosis of the right carotid artery distal cervical segment.  There was severe high-grade stenosis at the origin of the right vertebral artery.  LDL cholesterol 48 mg percent.  Hemoglobin A1c was 6.6.  Cardiology was consulted and he was found to have NSTEMI and subsequently underwent cardiac cath on 03/06/2018 which required coronary stenting.  Plan was to do outpatient cardiac monitoring for paroxysmal A. fib however that has not been set up yet.  He has not yet followed up with his cardiologist.  Patient states he is on aspirin and Brilinta and tolerating it well without bruising or bleeding.  He has finished home physical and occupational therapy.  He is able to ambulate now independently with a cane for the last 2 weeks.  He is noticed improvement in his left hand though fine motor skills appear to be still diminished.  He has been doing exercises which he has learned from occupational therapist.  Patient was supposed to follow-up with Dr. Estanislado Pandy to discuss possible intracranial stenting but has not yet made that appointment.  He states his blood pressure is under good control.  He is tolerating Lipitor well without muscle aches and pains. Observations/Objective: Physical and neurological exams are limited due to constraints from virtual video visit.  Pleasant elderly African-American male not in distress.  He is awake alert interactive.  Speech and language appear normal.  He has intact attention registration and recall.  Extraocular movements full range without nystagmus.  Face is symmetric without weakness.  Tongue is midline.  Motor system exam showed no drift or focal weakness.  Fine finger movements are diminished on the left.  He orbits right over left upper extremity.  He walks  with a cane is slight dragging of the left leg. 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  Paroxysmal A. fib is also consideration.  Multiple vascular risk factors of diabetes, hypertension, hyperlipidemia, congestive heart failure, coronary artery disease and symptomatic left carotid stenosis with multivessel extracranial and intracranial stenosis Plan ; I had a long discussion with the patient about his recent by cerebral infarcts and discussed results of imaging studies and answered questions.  Continue aspirin and Brilinta for now given his recent coronary stent but he does have symptomatic high-grade left carotid stenosis as well for which he may need endovascular intervention.  I have advised him to call Dr. Estanislado Pandy to set up a meeting to plan elective left carotid stenting.  Maintain strict control of hypertension with blood pressure goal below 130/90, lipids with LDL cholesterol goal below 70 mg percent and diabetes with hemoglobin A1c goal below 6.5%.  He was also advised to eat a healthy diet and be active and not gain weight.  He was also advised to follow-up with his cardiologist for his coronary stent. Follow Up Instructions: Follow-up with Dr. Estanislado Pandy for elective left carotid stent Follow-up in the stroke clinic in 3 months with Janett Billow nurse practitioner   I discussed the assessment and treatment plan with the patient. The patient was provided an opportunity to ask questions and all were answered. The patient agreed with the plan and demonstrated an understanding of the instructions.   The patient was advised to call back or seek an in-person evaluation if the symptoms worsen or if the condition fails to improve as anticipated.  I provided 30 minutes of non-face-to-face time during this encounter.   Antony Contras, MD

## 2018-06-18 NOTE — ED Provider Notes (Signed)
Patient seen/examined in the Emergency Department in conjunction with Advanced Practice Provider [facility] Patient reports increase left UE/LE weakness Exam : awake/alert, but pt is slow to respond, ?left UE drift Plan: will need MRI brain as pt has had previous stroke with left sided deficits.   tPA in stroke considered but not given due to: Onset over 3-4.5hours     Ripley Fraise, MD 06/18/18 [phone]

## 2018-07-15 ENCOUNTER — Telehealth (HOSPITAL_COMMUNITY): Payer: Self-pay

## 2018-07-15 NOTE — Telephone Encounter (Signed)
Called to schedule f/u, no answer, left vm. AW 

## 2018-08-04 ENCOUNTER — Telehealth (HOSPITAL_COMMUNITY): Payer: Self-pay

## 2018-08-04 NOTE — Telephone Encounter (Signed)
Called to schedule f/u, no answer, left vm. AW 

## 2018-08-18 ENCOUNTER — Inpatient Hospital Stay (HOSPITAL_COMMUNITY): Admission: RE | Admit: 2018-08-18 | Payer: Medicare HMO | Source: Ambulatory Visit

## 2018-08-19 ENCOUNTER — Other Ambulatory Visit (HOSPITAL_COMMUNITY): Payer: Self-pay | Admitting: Interventional Radiology

## 2018-08-19 DIAGNOSIS — I639 Cerebral infarction, unspecified: Secondary | ICD-10-CM

## 2018-09-01 ENCOUNTER — Ambulatory Visit (HOSPITAL_COMMUNITY): Payer: Medicare HMO

## 2018-09-11 ENCOUNTER — Other Ambulatory Visit: Payer: Self-pay

## 2018-09-11 DIAGNOSIS — I739 Peripheral vascular disease, unspecified: Secondary | ICD-10-CM

## 2018-09-15 ENCOUNTER — Ambulatory Visit: Payer: Medicare HMO | Admitting: Vascular Surgery

## 2018-09-15 ENCOUNTER — Inpatient Hospital Stay (HOSPITAL_COMMUNITY): Admission: RE | Admit: 2018-09-15 | Payer: Medicare HMO | Source: Ambulatory Visit

## 2018-10-01 ENCOUNTER — Telehealth (HOSPITAL_COMMUNITY): Payer: Self-pay

## 2018-10-01 NOTE — Telephone Encounter (Signed)
Called to reschedule consult, no answer, left vm. AW  

## 2018-11-15 DEATH — deceased

## 2019-12-27 IMAGING — CT CT HEAD W/O CM
4 series · 16 of 47 positions shown, 18 images · non-contrast
Comparison: Brain MRI and head CT 02/27/2018

CLINICAL DATA: 72-year-old male with New left upper extremity
weakness. Scattered tiny watershed infarcts suspected in both
hemispheres on recent brain MRI.

EXAM:
CT HEAD WITHOUT CONTRAST
TECHNIQUE: Contiguous axial images were obtained from the base of the skull
through the vertex without intravenous contrast.

[Series 3: head without · axial · non-contrast · 0.43mm/px · z∈[-90,+30]mm · 7 of 34 slices shown, 9 images]
[im 5/34  brain]
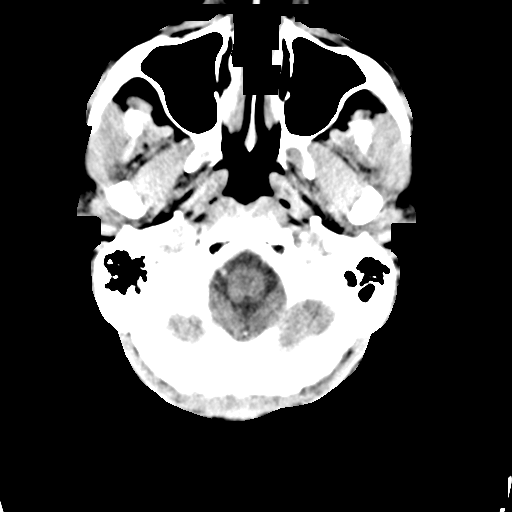
[im 5/34  bone]
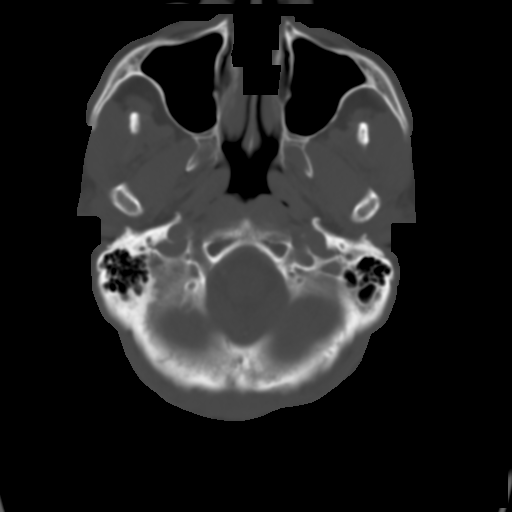
[im 9/34  brain]
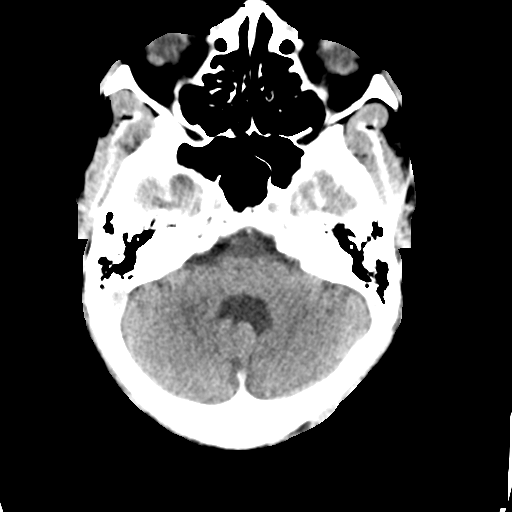
[im 13/34  brain]
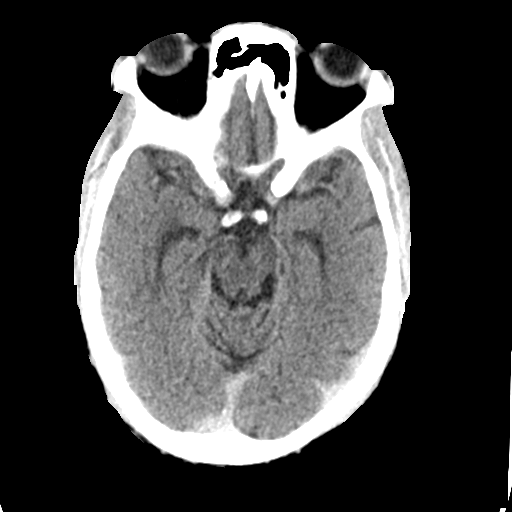
[im 17/34  brain]
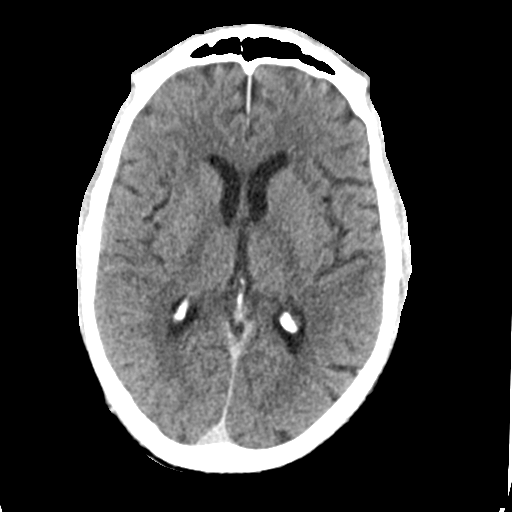
[im 21/34  brain]
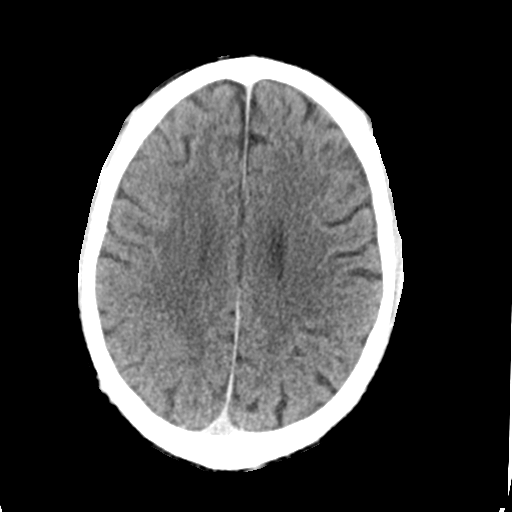
[im 21/34  bone]
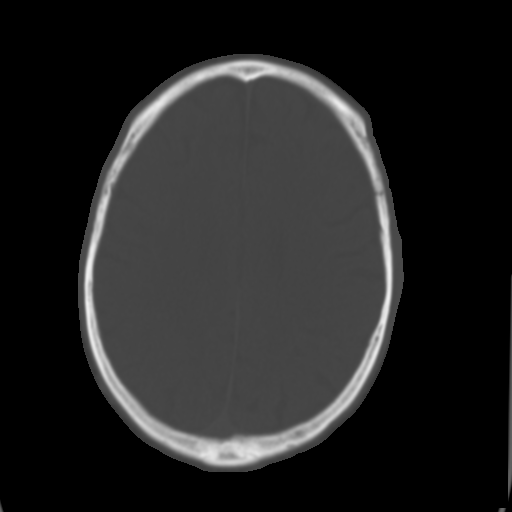
[im 25/34  brain]
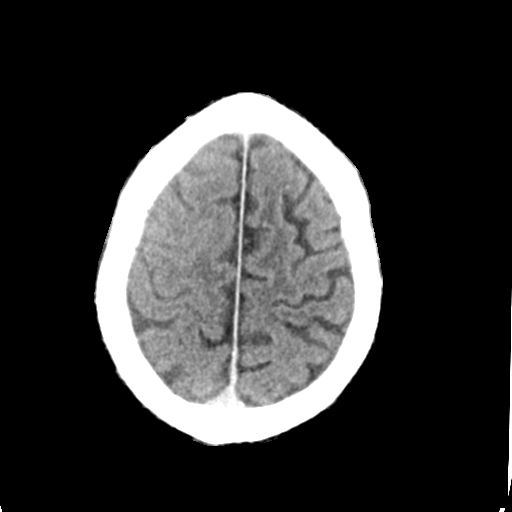
[im 29/34  brain]
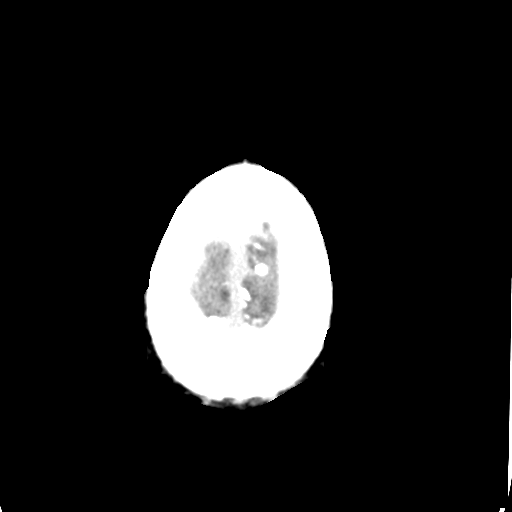

[Series 4: head bone · axial · 0.43mm/px · z∈[-94,-62]mm · 3 of 84 slices shown]
[im 9/84  bone]
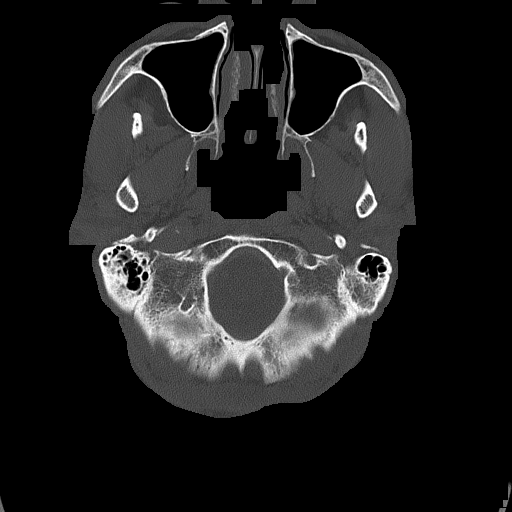
[im 17/84  bone]
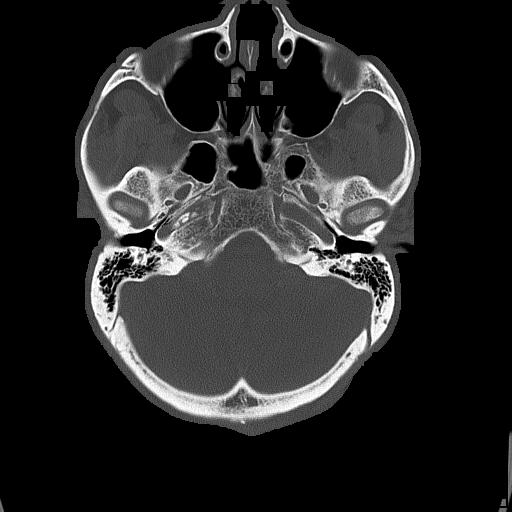
[im 25/84  bone]
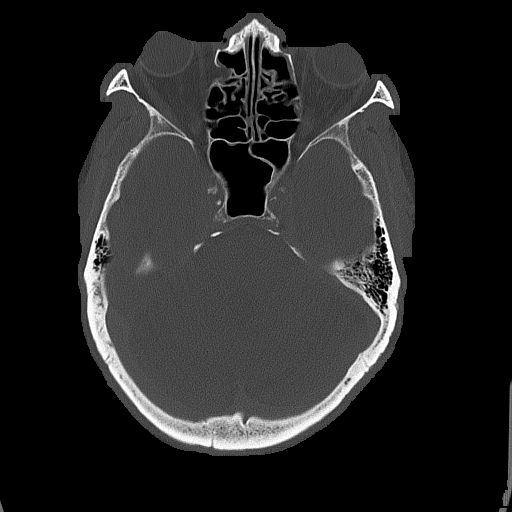

[Series 5: head without cor · coronal · non-contrast · 0.33mm/px · 3 of 76 slices shown]
[im 26/76  brain]
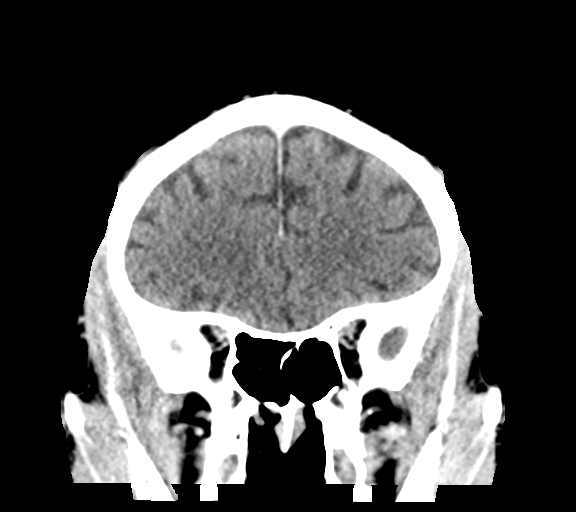
[im 34/76  brain]
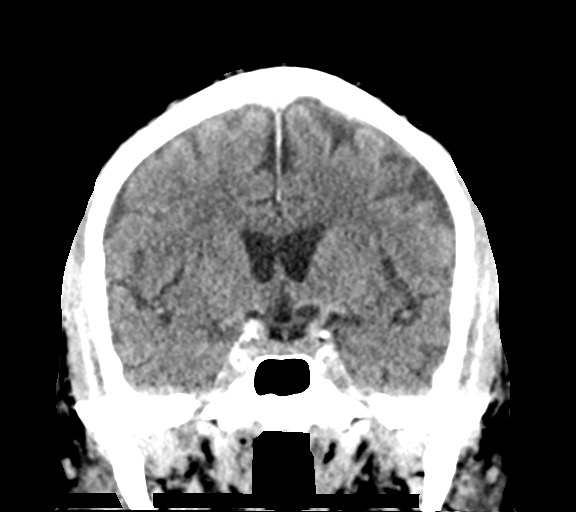
[im 42/76  brain]
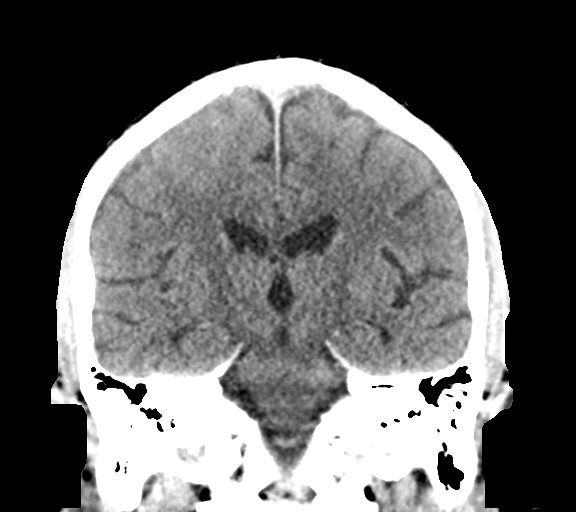

[Series 6: head without sag · sagittal · non-contrast · 0.35mm/px · 3 of 63 slices shown]
[im 21/63  brain]
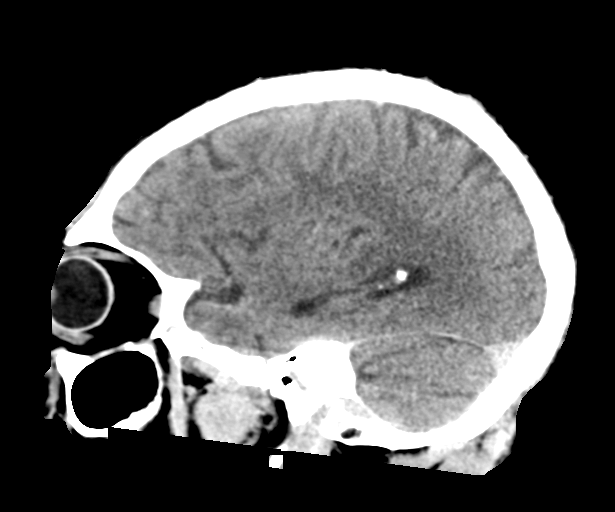
[im 32/63  brain]
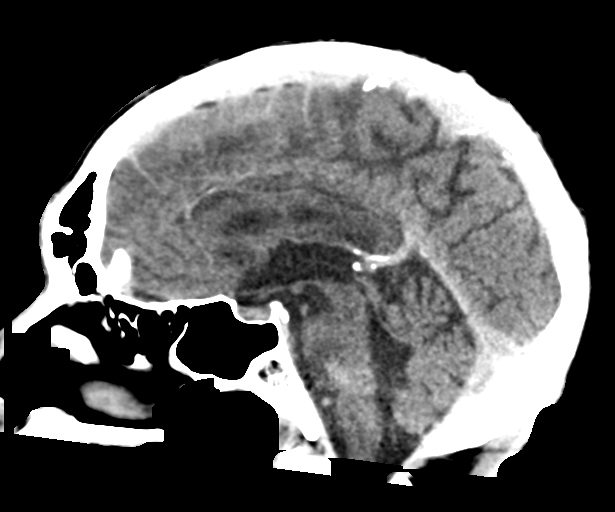
[im 42/63  brain]
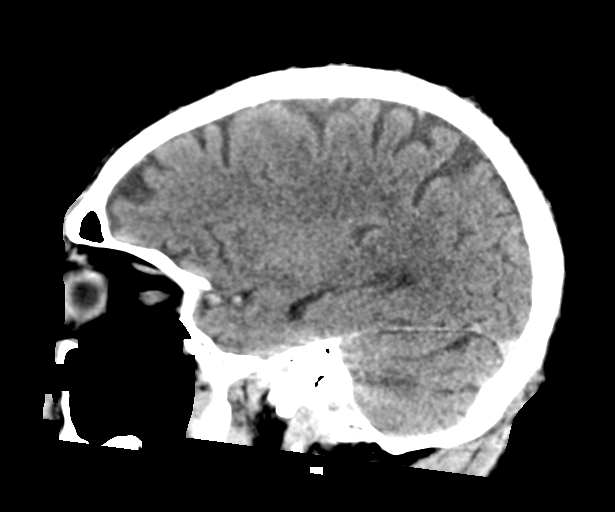

[16 of 47 positions shown; findings below may reference images not displayed]

FINDINGS: Brain: Loss of sulci and gray-white matter differentiation in the
right superior frontal lobe most apparent on coronal image 40. No
regional mass effect.

Elsewhere in the right MCA territory gray-white matter
differentiation appears stable and preserved. No definite acute
intracranial hemorrhage. In the left hemisphere and posterior fossa
gray-white matter differentiation appears stable. Ventricle size
remains normal.

Vascular: Calcified atherosclerosis at the skull base. Questionable
new hyperdensity of the right M1 is new compared to 02/27/2018 on
series 3, image 12. However, there seems to be generalized increased
vascular density when compared to the prior CT.

Skull: Stable.  Chronic right lamina papyracea fracture.

Sinuses/Orbits: Visualized paranasal sinuses and mastoids are stable
and well pneumatized.

Other: No acute orbit or scalp soft tissue findings.
IMPRESSION: 1. Loss of right superior frontal lobe sulci and gray-white matter,
most suspicious for cytotoxic edema such as due to new infarct. No
mass effect. No definite acute hemorrhage.
2. Seemingly generalized increased density of vascular structures
compared to the earlier CT. No recent IV contrast administration is
known. Perhaps this might reflect dehydration.

Study discussed by telephone with Dr. KHUSHBU HAUGHTON on 03/02/2018 at

## 2019-12-27 IMAGING — MR MR HEAD W/O CM
12 of 13 series · 44 of 48 positions shown · non-contrast
Comparison: CT earlier same day.  MRI 02/27/2018.

CLINICAL DATA: Acute onset left arm weakness. Right frontal edema
by CT.

EXAM:
MRI HEAD WITHOUT CONTRAST
TECHNIQUE: Multiplanar, multiecho pulse sequences of the brain and surrounding
structures were obtained without intravenous contrast.

[Series 5: DWI · axial · 3.0mm · 0.88mm/px · z∈[-29,+117]mm · 8 of 100 slices shown (1 of 4)]
[im 1/100]
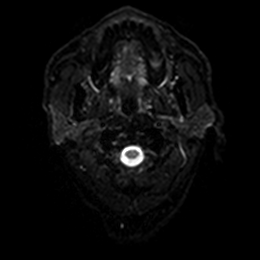
[im 15/100]
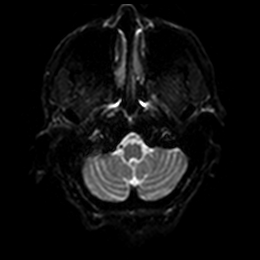
[im 29/100]
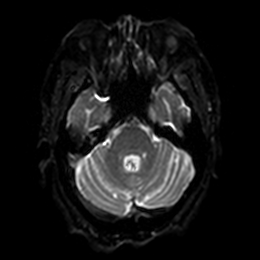
[im 43/100]
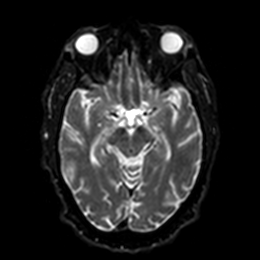
[im 57/100]
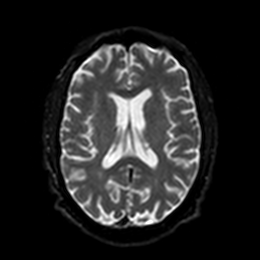
[im 71/100]
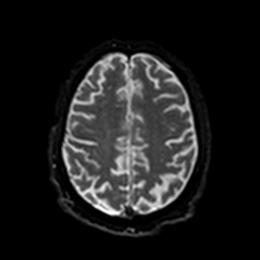
[im 85/100]
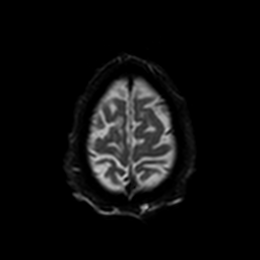
[im 100/100]
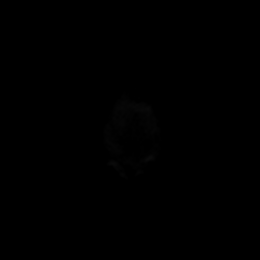

[Series 6: DWI · axial · 3.0mm · 0.88mm/px · z∈[-29,+117]mm · 4 of 50 slices shown (2 of 4)]
[im 1/50]
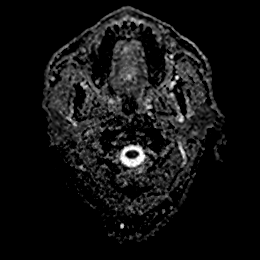
[im 17/50]
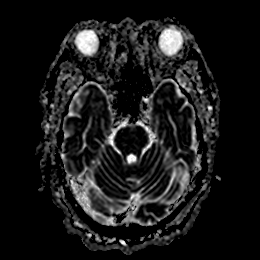
[im 33/50]
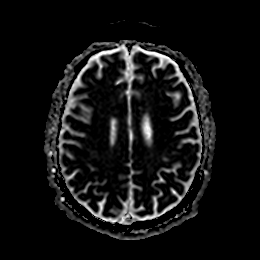
[im 50/50]
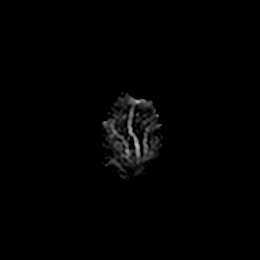

[Series 7: DWI · coronal · 4.0mm · 0.88mm/px · 5 of 72 slices shown (3 of 4)]
[im 1/72]
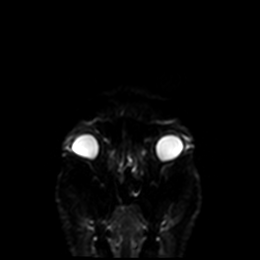
[im 18/72]
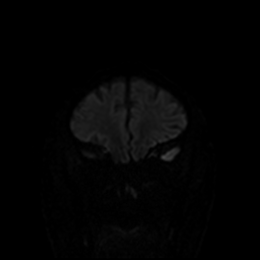
[im 36/72]
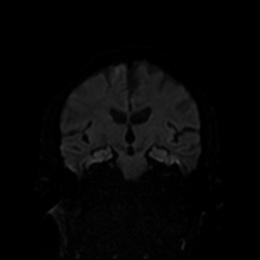
[im 54/72]
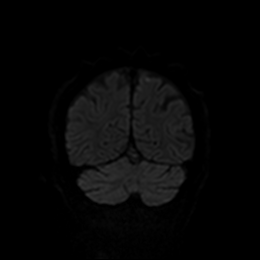
[im 72/72]
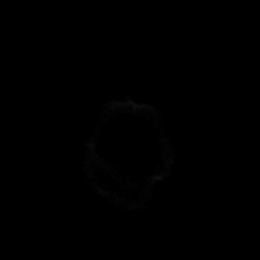

[Series 8: DWI · coronal · 4.0mm · 0.88mm/px · 3 of 36 slices shown (4 of 4)]
[im 1/36]
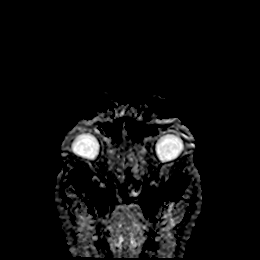
[im 18/36]
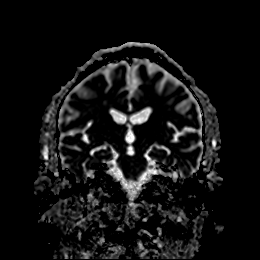
[im 36/36]
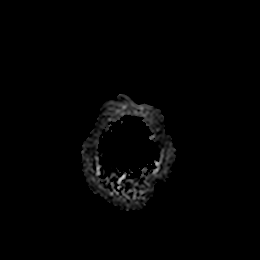

[Series 9: T1 · sagittal · 5.0mm · 0.75mm/px · 2 of 23 slices shown]
[im 1/23]
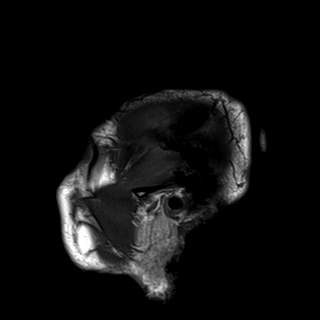
[im 23/23]
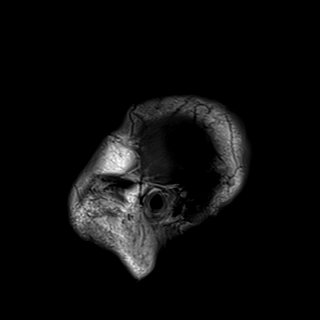

[Series 10: T2 · axial · 5.0mm · 0.72mm/px · z∈[-43,+131]mm · 2 of 30 slices shown (1 of 2)]
[im 1/30]
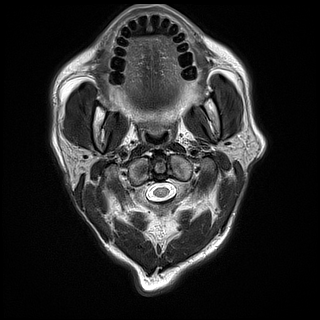
[im 30/30]
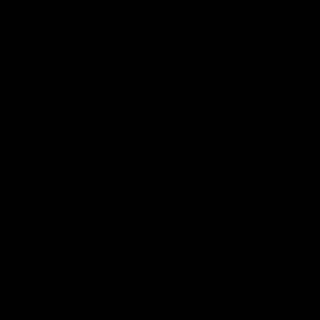

[Series 11: FLAIR · axial · 5.0mm · 0.45mm/px · z∈[-41,+132]mm · 2 of 30 slices shown]
[im 1/30]
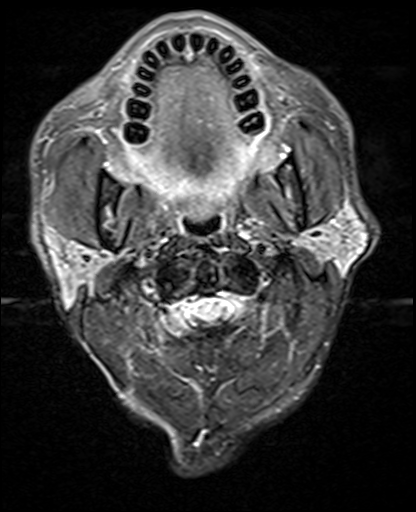
[im 30/30]
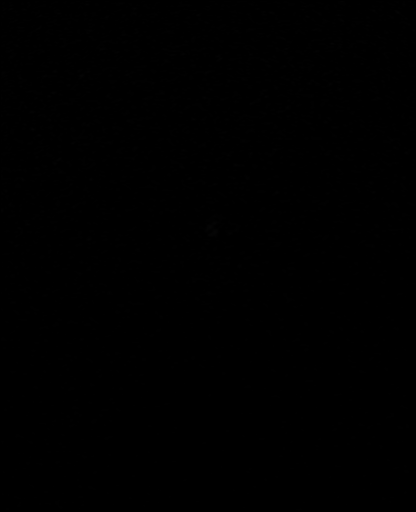

[Series 12: mag_images · axial · 3.0mm · 0.90mm/px · z∈[-39,+138]mm · 4 of 60 slices shown]
[im 1/60]
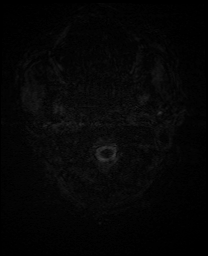
[im 20/60]
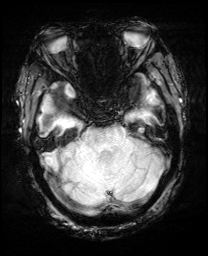
[im 40/60]
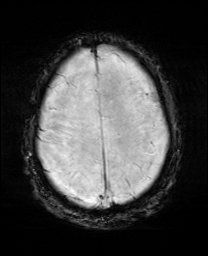
[im 60/60]
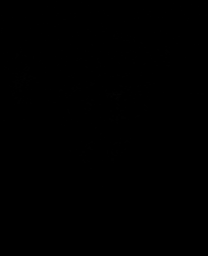

[Series 13: pha_images · axial · 3.0mm · 0.90mm/px · z∈[-39,+120]mm · 4 of 53 slices shown]
[im 1/53]
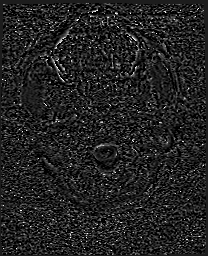
[im 18/53]
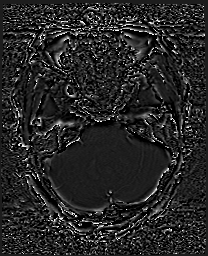
[im 35/53]
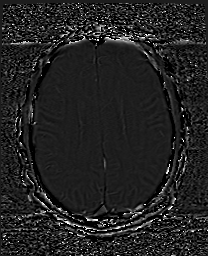
[im 53/53]
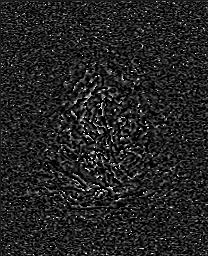

[Series 14: swi_images · axial · 3.0mm · 0.90mm/px · z∈[-39,+138]mm · 4 of 60 slices shown]
[im 1/60]
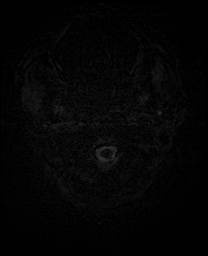
[im 20/60]
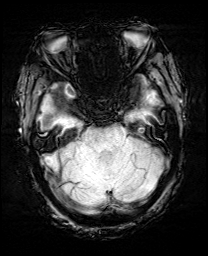
[im 40/60]
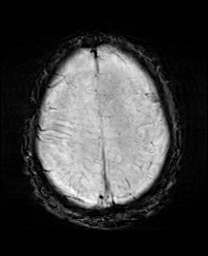
[im 60/60]
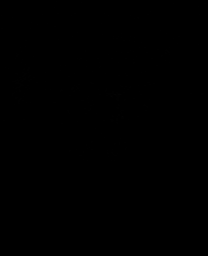

[Series 15: mip_images(sw) · axial · 24.0mm · 0.90mm/px · z∈[-28,+128]mm · 4 of 53 slices shown]
[im 1/53]
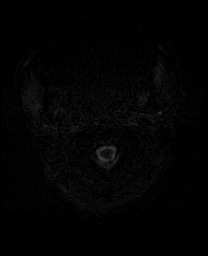
[im 18/53]
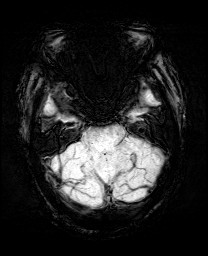
[im 35/53]
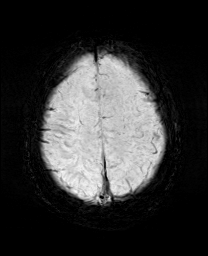
[im 53/53]
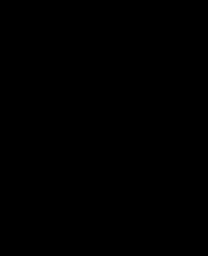

[Series 17: T2 · coronal · 5.0mm · 0.34mm/px · 2 of 29 slices shown (2 of 2)]
[im 1/29]
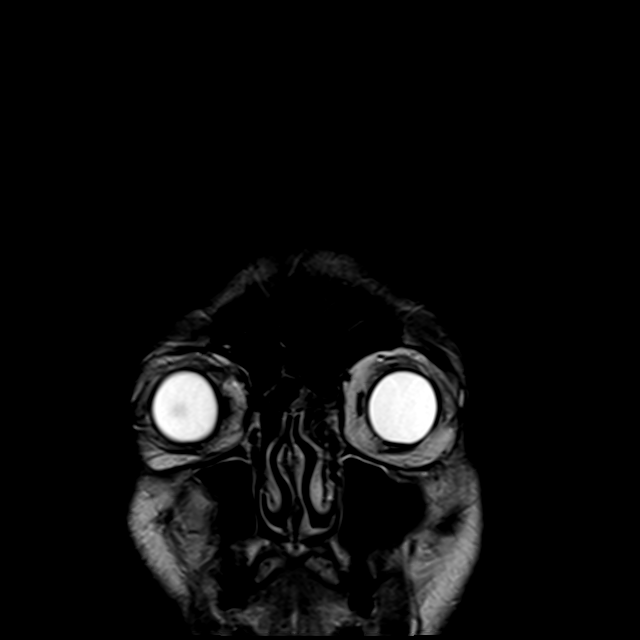
[im 29/29]
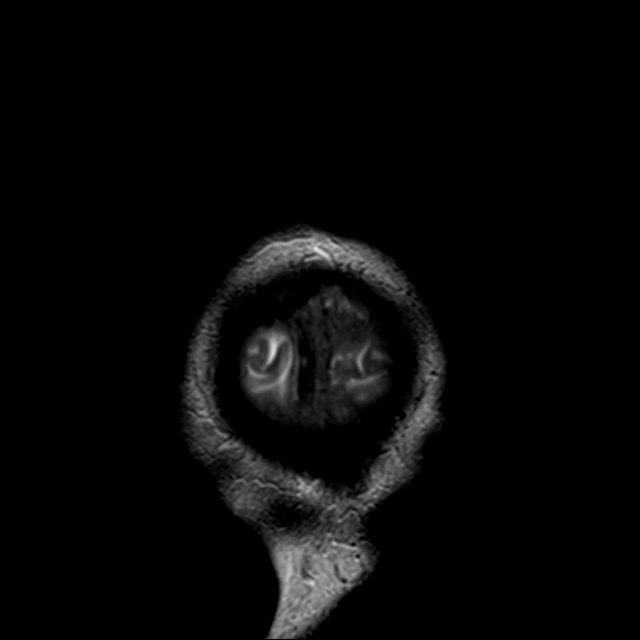

[44 of 48 positions shown; findings below may reference images not displayed]

FINDINGS: Brain: No appreciable change since the study of 3 days ago.
Acute/subacute infarctions affecting the cortical brain at the
vertices, right more than left, most consistent with watershed
infarctions. These do not appear progressive. There is only
mild/minimal brain swelling in the right frontal region. No evidence
of mass, hemorrhage, hydrocephalus or extra-axial collection.

Vascular: Major vessels at the base of the brain show flow.

Skull and upper cervical spine: Negative

Sinuses/Orbits: Clear/normal

Other: None
IMPRESSION: No change since the study of 3 days ago. Likely watershed
distribution cortical infarctions at the cerebral hemispheric
vertices, right more than left. No new area of involvement. No
significant increase in swelling. No sign of hemorrhage.

I should note that in review the CT angiogram of 02/27/2018, I think
the stenosis at the distal bulb on the left is 70% or greater. I
think there is very severe stenosis in the left carotid siphon,
likely flow limiting. Moderate stenosis in the right carotid siphon.

## 2020-01-10 IMAGING — DX DG CHEST 1V
1 series · 2 of 2 positions shown · non-contrast
Comparison: None.

CLINICAL DATA: Shortness of breath.

EXAM:
CHEST  1 VIEW

[Series 1: chest · 0.14mm/px · 2 of 2 slices shown]
[im 1/2]
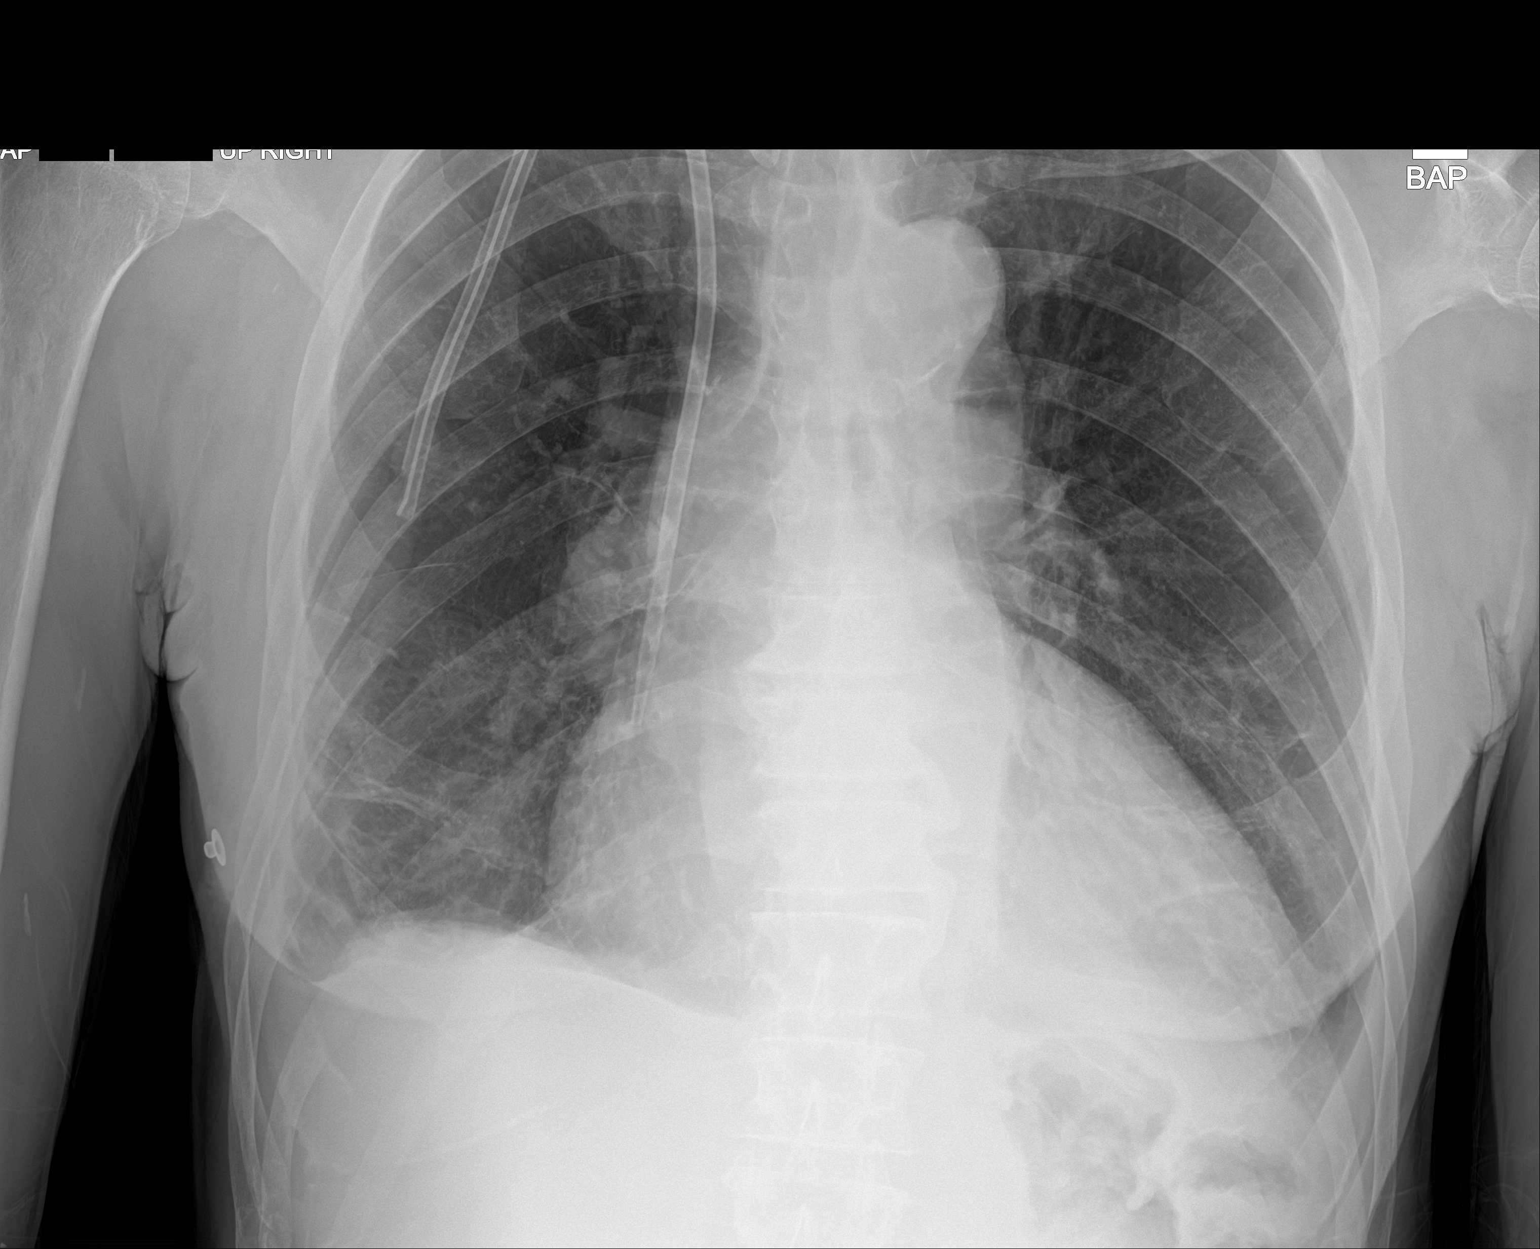
[im 2/2]
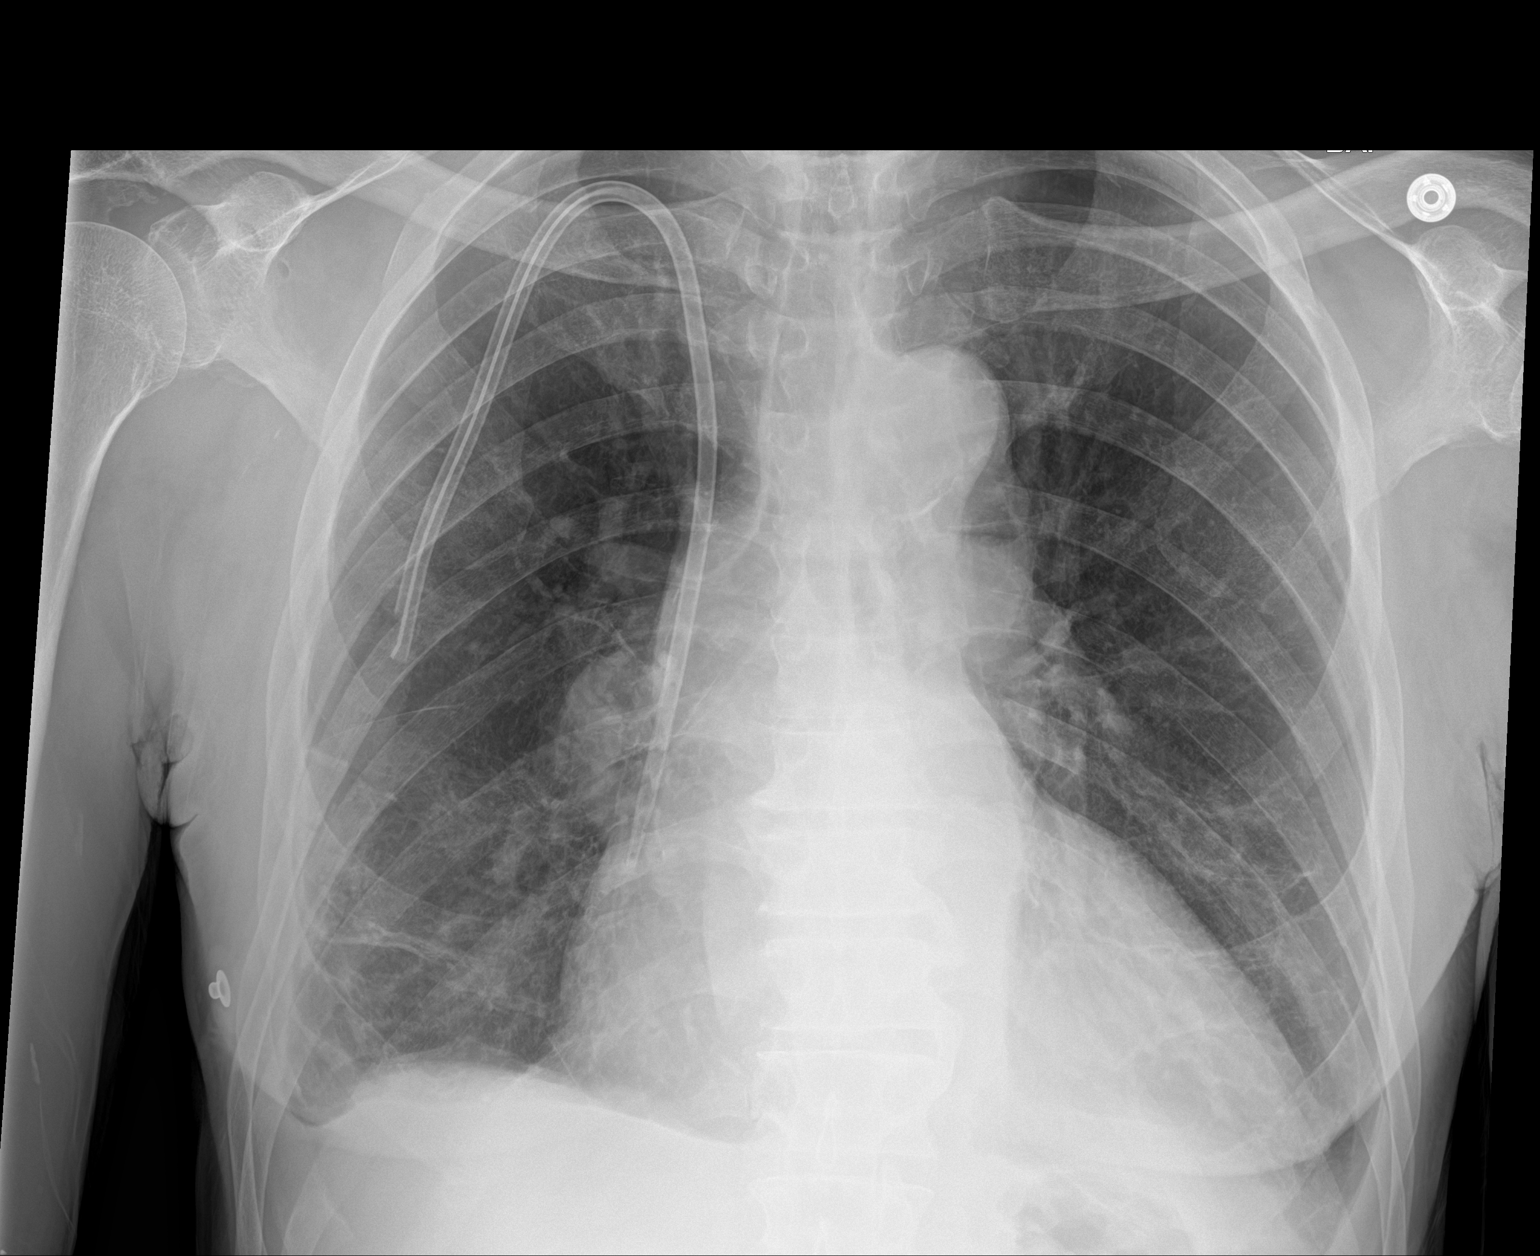

[2 of 2 positions shown; findings below may reference images not displayed]

FINDINGS: Heart is enlarged. Aortic atherosclerosis is present. The lungs are
hyperinflated. No significant edema present. There are no
significant effusions. A right IJ dialysis catheter is in place. The
visualized soft tissues and bony thorax are unremarkable.
IMPRESSION: 1. Cardiomegaly without failure.
2. Aortic atherosclerosis.
3. Hyperinflation of both lungs, suggesting COPD.

## 2020-01-14 IMAGING — XA INTRAVSC STENT CERV CAROTID W/ EMB-PROT
1 series · 10 of 24 positions shown · IV contrast (IODINE)
Comparison: Diagnostic arteriogram of 03/10/2018.

CLINICAL DATA: Severe high-grade stenosis of the left internal
carotid artery proximally.

EXAM:
INTRAVSC STENT CERV CAROTID W/ EMB-PROT
TECHNIQUE: Informed written consent was obtained from the patient after a
thorough discussion of the procedural risks, benefits and
alternatives. All questions were addressed. Maximal Sterile Barrier
Technique was utilized including caps, mask, sterile gowns, sterile
gloves, sterile drape, hand hygiene and skin antiseptic. A timeout
was performed prior to the initiation of the procedure.

[Series 300: dr. (person_name) · 10 of 121 slices shown]
[im 6/121]
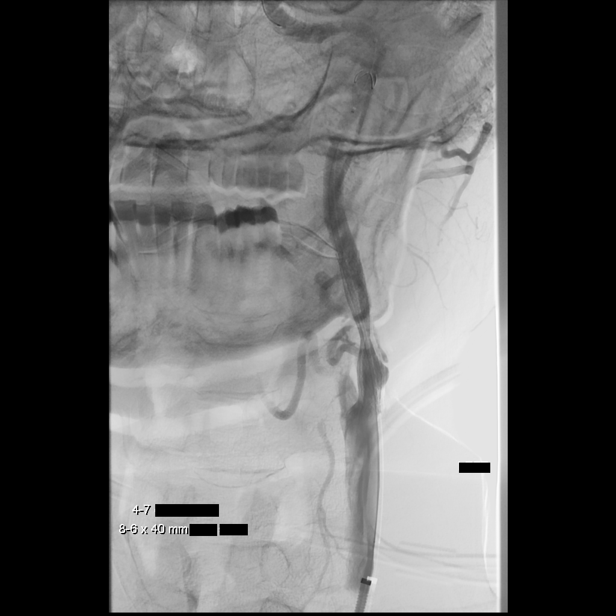
[im 16/121]
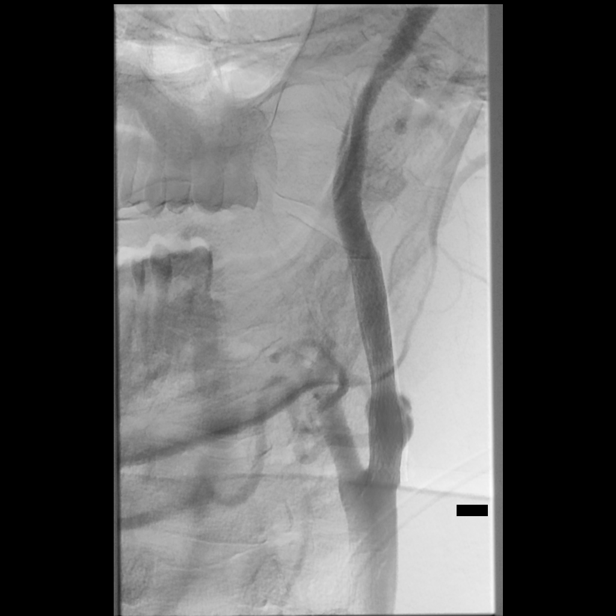
[im 32/121]
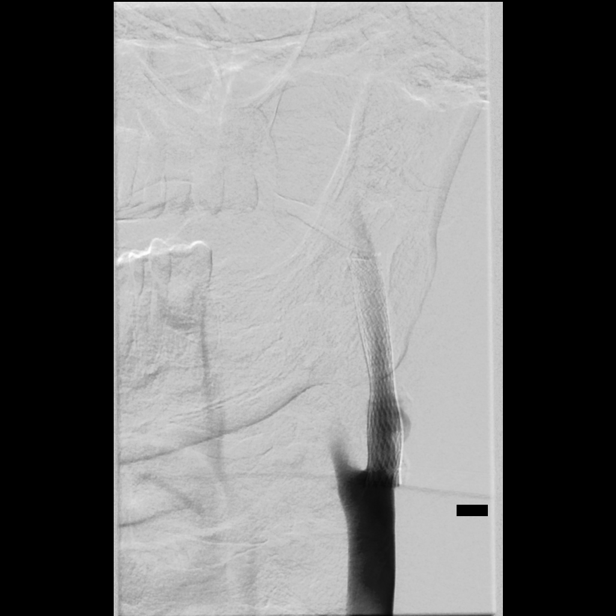
[im 42/121]
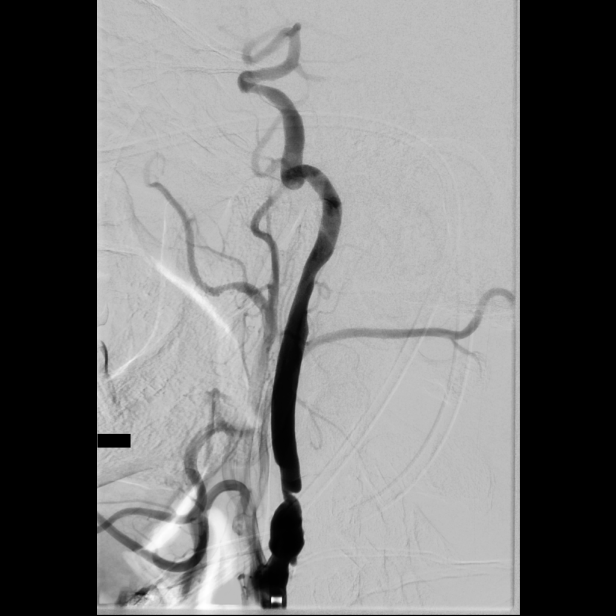
[im 53/121]
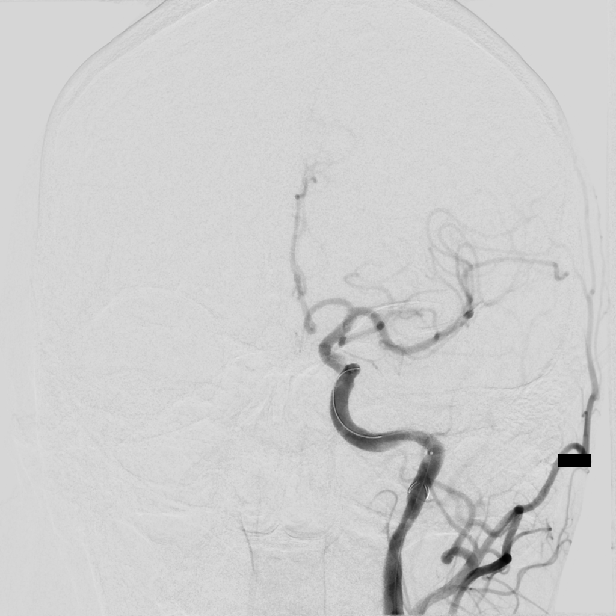
[im 68/121]
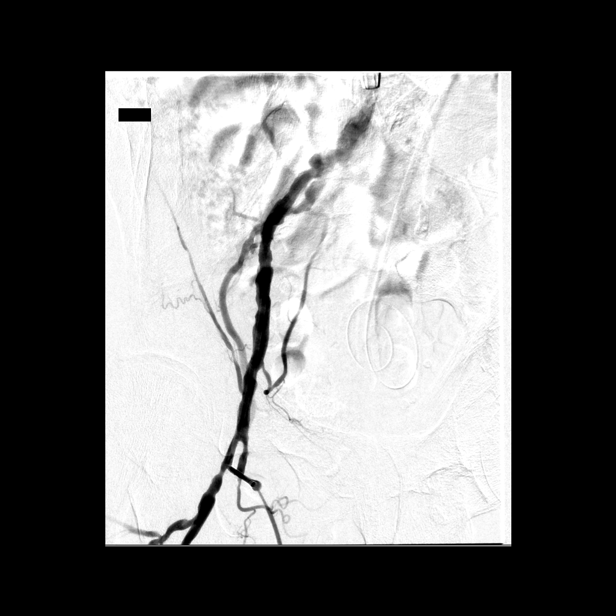
[im 79/121]
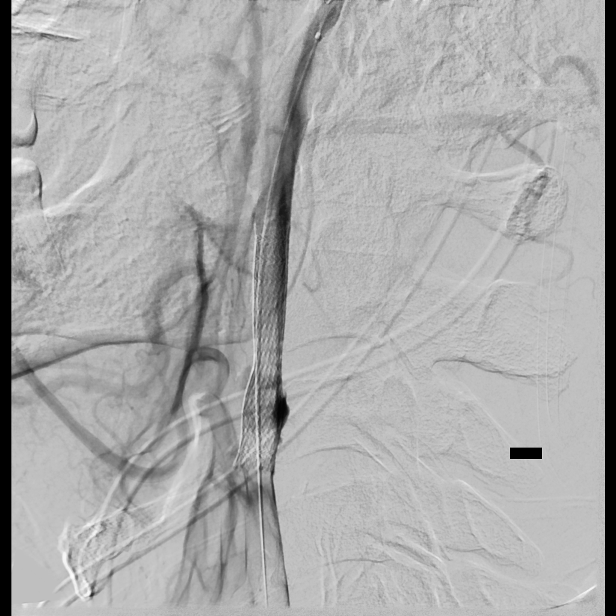
[im 94/121]
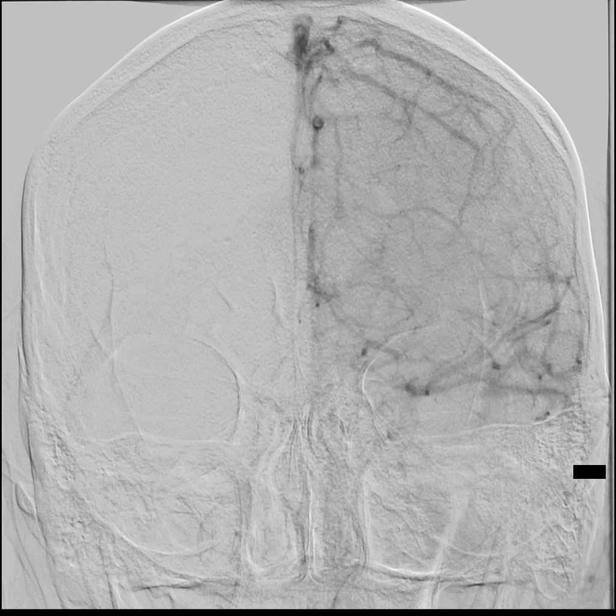
[im 105/121]
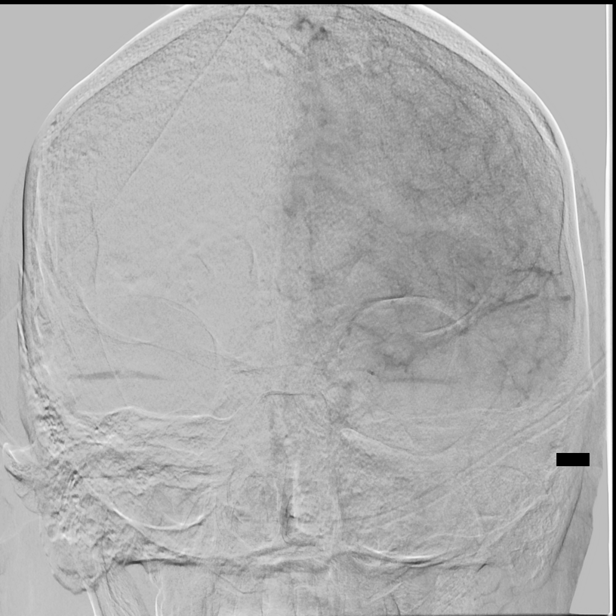
[im 115/121]
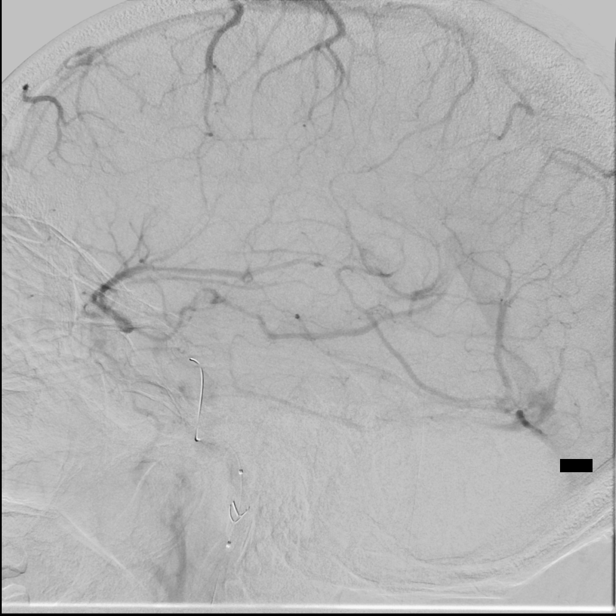

[10 of 24 positions shown; findings below may reference images not displayed]

MEDICATIONS:
Heparin 3,500 units IV; Ancef 2 g IV antibiotic was administered
within 1 hour of the procedure.

ANESTHESIA/SEDATION:
Mac anesthesia as per [REDACTED] at [REDACTED].

CONTRAST:  Isovue 300 approximately 75 mL.

FLUOROSCOPY TIME:  Fluoroscopy Time: 26 minutes 36 seconds (712
mGy).

COMPLICATIONS:
None immediate.
The right groin was prepped and draped in the usual sterile fashion.
Thereafter using modified Seldinger technique, transfemoral access
into the right common femoral artery was obtained without
difficulty. Over a 0.035 inch guidewire, a 5 French Pinnacle sheath
was inserted. Through this, and also over 0.035 inch guidewire, a 5
French JB 1 catheter was advanced to the aortic arch region and
selectively positioned in the left common carotid artery.
FINDINGS: The left common femoral arteriogram again demonstrates the origin
the left external carotid artery and its major branches to be widely
patent.

The left internal carotid artery just distal to the bulb again
demonstrates severe high-grade stenosis of approximately 80 to 90%.
Distal to this the vessel is seen to opacify to the cranial skull
base.

Mild stenosis is seen at the junction of the distal [DATE] and the
middle [DATE] of the left internal carotid artery.

The petrous segment is widely patent.

There is an approximately 50% stenosis of the proximal cavernous
segment of the left internal carotid artery.

Distal to this the distal cavernous, and the supraclinoid segments
are widely patent.

The left middle cerebral artery and the left anterior cerebral
artery opacify into the capillary and venous phases.

The left posterior communicating artery is seen opacifying the left
posterior cerebral artery distribution.

ENDOVASCULAR REVASCULARIZATION OF THE SEVERELY STENOTIC LEFT
INTERNAL CAROTID ARTERY PROXIMALLY WITH STENT ASSISTED ANGIOPLASTY
WITH DISTAL PROTECTION.

The diagnostic JB 1 catheter in left common carotid artery was
exchanged over a 0.035 inch 300 cm Rosen exchange guidewire for an
80 cm 6 Cucul Hrenandez using biplane roadmap technique
and constant fluoroscopic guidance. Good aspiration obtained from
hub of the Zeinulla Koshanova. A gentle control arteriogram
performed through the sheath demonstrated no evidence of spasms,
dissections or of intraluminal filling defects. Distal circulation
remain unchanged.

At this time, a 4/7 mm diameter NAV Emboshield distal protection
device was purged of air in its housing.

The filter was then captured into the delivery microcatheter.

A J configuration was given to the distal end of the 014 inch micro
guidewire.

Thereafter, using biplane roadmap technique and constant
fluoroscopic guidance, in a coaxial manner and with constant
heparinized saline infusion, the combination of the filter carrier
catheter, with the filter wire was advanced to the distal end of the
Zeinulla Koshanova.

The micro guidewire was then manipulated using a torque device and
advanced without difficulty through the high-grade stenosis of the
left internal carotid proximally. This was then followed by the
delivery microcatheter. The combination was advanced until the
distal end of the micro guidewire was in the horizontal petrous
segment.

At this time, the filter device was then deployed without event
following release of the mechanism, and retrieving the delivery
microcatheter under constant fluoroscopic guidance. The delivery
microcatheter was retrieved and removed. A control arteriogram
performed through the Zeinulla Koshanova in the left common carotid
artery demonstrated safe position of the tip of the filter device
without evidence of dissections or of vasospasm.

Measurements were performed of the left internal carotid artery
proximally in its normal segment, and also the left common carotid
artery.

It was decided to use a 6 mm-8 mm x 40 mm Xact stent.

This was retrogradely prepped with heparinized saline infusion.

Thereafter using the rapid exchange technique, the delivery catheter
with the stent was then advanced to the left common carotid
artery/internal carotid artery junction.

The proximal and the distal markers were then positioned.

The stent was then deployed in the usual manner without difficulty.

With the distal filter wire remaining stable, the delivery mechanism
of the stent was then retrieved and removed. A control arteriogram
performed through the Zeinulla Koshanova on the left common carotid
artery demonstrated significantly improved caliber and flow through
the stented segment intracranially.

However, there continued to be a significant waist.

This prompted the advancement of a 5 mm x 30 mm Viatrac 014
angioplasty balloon catheter at the site of the waist within the
stented segment.

The balloon had been retrogradely prepped with heparinized saline
infusion followed by advancement over the exchange microcatheter
using the rapid exchange technique. The markers were then positioned
adequate distance from the site of the waist. The balloon was then
inflated using micro inflation syringe device via micro tubing to
10.3 atmospheres where it was maintained for approximately 20
seconds. Balloon was then deflated and retrieved and removed while
the distal filter wire was capped and in stable position.

A control arteriogram performed through the Zeinulla Koshanova
demonstrated excellent flow through the stent and angioplasty
segment of the left internal carotid artery. Intracranially, the
left anterior and the left middle cerebral artery distribution
remained stable without evidence of intraluminal filling defects or
of occlusions.

The filter capture catheter was then retrogradely prepped with
heparinized saline infusion.

With rapid exchange technique, this was then advanced to the
proximal portion of the filter device.

The wire was then retrieved into the capture filter catheter. The
two were then removed under constant fluoroscopic guidance. No
entanglement with the stent was noted.

Control arteriograms were then performed at 15 and 30 minutes post
the intra stent angioplasty. These continued to demonstrate
excellent apposition and flow through the stented segment and
intracranially.

Prior to the placement of the stent in the left internal carotid
artery, the patient was given a quarter bolus dose of Aggrastat
intravenously. This was 437.5 mcg mixed in normal saline to a 34 mL
volume.

The Zeinulla Koshanova was then retrieved into the abdominal aorta
and exchanged over a J-tip guidewire for a 6 French Pinnacle sheath.
This in turn was then removed with manual compression being applied
for successful hemostasis. The right groin appeared soft without
evidence of hematoma or bleeding. Distal pulses remained Dopplerable
in the posterior tibial and the dorsalis pedis regions bilaterally.

Neurologically, the patient was asymptomatic with no change in his
vision, speech, or motor function in his upper and lower
extremities.

Patient was then transferred to the neuro ICU to continue on
low-dose IV heparin, to monitor his blood pressure, and also to
monitor neurologically.

Overnight the patient had no significant issues.

The following morning, the IV heparin was stopped. The patient at
the time of visit was undergoing dialysis.

He remained stable nonfocal.

The patient's lab work revealed a count of 40 K. This was discussed
with the admitting. It was felt this could be related to the
patient's Aggrastat. In view of this, it was decided to repeat the
patient's platelet count the following morning.

The groin site and the distal pulses remained unchanged.

The patient was then transferred back to the admittance service. It
was decided to leave the patient on aspirin 81 mg a day, and
Brilinta 45 mg twice a day. Patient was instructed to maintain
adequate hydration and to continue taking his medications. He was
advised to be aware of a stroke-like symptoms. Should those occur,
call 911.

PLAN:
Follow-up in the clinic 4 weeks post treatment. Follow-up ultrasound
of the carotids approximately 3-4 months post treatment.
